# Patient Record
Sex: Female | Born: 1970 | Race: Black or African American | Hispanic: No | Marital: Married | Smoking: Former smoker
Health system: Southern US, Community
[De-identification: ages and names within clinical notes are randomized; demographics above are authoritative.]

## PROBLEM LIST (undated history)

## (undated) DIAGNOSIS — G4730 Sleep apnea, unspecified: Secondary | ICD-10-CM

## (undated) DIAGNOSIS — I10 Essential (primary) hypertension: Secondary | ICD-10-CM

## (undated) DIAGNOSIS — E785 Hyperlipidemia, unspecified: Secondary | ICD-10-CM

## (undated) DIAGNOSIS — N938 Other specified abnormal uterine and vaginal bleeding: Secondary | ICD-10-CM

## (undated) DIAGNOSIS — IMO0002 Reserved for concepts with insufficient information to code with codable children: Secondary | ICD-10-CM

## (undated) HISTORY — DX: Essential (primary) hypertension: I10

## (undated) HISTORY — DX: Reserved for concepts with insufficient information to code with codable children: IMO0002

## (undated) HISTORY — PX: KNEE SURGERY: SHX244

## (undated) HISTORY — DX: Hyperlipidemia, unspecified: E78.5

## (undated) HISTORY — DX: Sleep apnea, unspecified: G47.30

## (undated) HISTORY — DX: Other specified abnormal uterine and vaginal bleeding: N93.8

## (undated) HISTORY — PX: TUBAL LIGATION: SHX77

---

## 2000-10-22 ENCOUNTER — Emergency Department (HOSPITAL_COMMUNITY): Admission: EM | Admit: 2000-10-22 | Discharge: 2000-10-22 | Payer: Self-pay | Admitting: Internal Medicine

## 2000-12-29 ENCOUNTER — Other Ambulatory Visit: Admission: RE | Admit: 2000-12-29 | Discharge: 2000-12-29 | Payer: Self-pay | Admitting: *Deleted

## 2001-01-13 ENCOUNTER — Encounter: Payer: Self-pay | Admitting: *Deleted

## 2001-01-13 ENCOUNTER — Ambulatory Visit (HOSPITAL_COMMUNITY): Admission: RE | Admit: 2001-01-13 | Discharge: 2001-01-13 | Payer: Self-pay | Admitting: *Deleted

## 2001-03-15 ENCOUNTER — Encounter: Payer: Self-pay | Admitting: *Deleted

## 2001-03-15 ENCOUNTER — Ambulatory Visit (HOSPITAL_COMMUNITY): Admission: RE | Admit: 2001-03-15 | Discharge: 2001-03-15 | Payer: Self-pay | Admitting: *Deleted

## 2001-05-09 ENCOUNTER — Ambulatory Visit (HOSPITAL_COMMUNITY): Admission: AD | Admit: 2001-05-09 | Discharge: 2001-05-09 | Payer: Self-pay | Admitting: *Deleted

## 2001-05-12 ENCOUNTER — Ambulatory Visit (HOSPITAL_COMMUNITY): Admission: RE | Admit: 2001-05-12 | Discharge: 2001-05-12 | Payer: Self-pay | Admitting: *Deleted

## 2001-05-16 ENCOUNTER — Ambulatory Visit (HOSPITAL_COMMUNITY): Admission: RE | Admit: 2001-05-16 | Discharge: 2001-05-16 | Payer: Self-pay | Admitting: *Deleted

## 2001-05-20 ENCOUNTER — Ambulatory Visit (HOSPITAL_COMMUNITY): Admission: RE | Admit: 2001-05-20 | Discharge: 2001-05-20 | Payer: Self-pay

## 2001-05-23 ENCOUNTER — Ambulatory Visit (HOSPITAL_COMMUNITY): Admission: RE | Admit: 2001-05-23 | Discharge: 2001-05-23 | Payer: Self-pay | Admitting: *Deleted

## 2001-05-26 ENCOUNTER — Ambulatory Visit (HOSPITAL_COMMUNITY): Admission: RE | Admit: 2001-05-26 | Discharge: 2001-05-26 | Payer: Self-pay | Admitting: *Deleted

## 2001-05-29 ENCOUNTER — Inpatient Hospital Stay (HOSPITAL_COMMUNITY): Admission: RE | Admit: 2001-05-29 | Discharge: 2001-06-02 | Payer: Self-pay | Admitting: *Deleted

## 2002-04-01 ENCOUNTER — Emergency Department (HOSPITAL_COMMUNITY): Admission: EM | Admit: 2002-04-01 | Discharge: 2002-04-02 | Payer: Self-pay | Admitting: Internal Medicine

## 2003-11-14 ENCOUNTER — Other Ambulatory Visit: Admission: RE | Admit: 2003-11-14 | Discharge: 2003-11-14 | Payer: Self-pay

## 2004-07-01 ENCOUNTER — Other Ambulatory Visit: Admission: RE | Admit: 2004-07-01 | Discharge: 2004-07-01 | Payer: Self-pay

## 2005-06-04 ENCOUNTER — Emergency Department (HOSPITAL_COMMUNITY): Admission: EM | Admit: 2005-06-04 | Discharge: 2005-06-04 | Payer: Self-pay | Admitting: Emergency Medicine

## 2005-07-02 ENCOUNTER — Ambulatory Visit (HOSPITAL_COMMUNITY): Admission: RE | Admit: 2005-07-02 | Discharge: 2005-07-02 | Payer: Self-pay | Admitting: Orthopaedic Surgery

## 2005-07-02 ENCOUNTER — Encounter (INDEPENDENT_AMBULATORY_CARE_PROVIDER_SITE_OTHER): Payer: Self-pay | Admitting: Orthopaedic Surgery

## 2005-07-24 ENCOUNTER — Ambulatory Visit (HOSPITAL_COMMUNITY): Admission: RE | Admit: 2005-07-24 | Discharge: 2005-07-24 | Payer: Self-pay | Admitting: Family Medicine

## 2006-04-28 ENCOUNTER — Emergency Department (HOSPITAL_COMMUNITY): Admission: EM | Admit: 2006-04-28 | Discharge: 2006-04-28 | Payer: Self-pay | Admitting: Emergency Medicine

## 2006-04-28 ENCOUNTER — Encounter (INDEPENDENT_AMBULATORY_CARE_PROVIDER_SITE_OTHER): Payer: Self-pay | Admitting: Family Medicine

## 2006-05-24 ENCOUNTER — Encounter: Payer: Self-pay | Admitting: Family Medicine

## 2006-05-24 ENCOUNTER — Ambulatory Visit: Payer: Self-pay | Admitting: Family Medicine

## 2006-05-24 ENCOUNTER — Encounter (INDEPENDENT_AMBULATORY_CARE_PROVIDER_SITE_OTHER): Payer: Self-pay | Admitting: Family Medicine

## 2006-05-24 DIAGNOSIS — Z9889 Other specified postprocedural states: Secondary | ICD-10-CM | POA: Insufficient documentation

## 2006-05-24 DIAGNOSIS — E669 Obesity, unspecified: Secondary | ICD-10-CM | POA: Insufficient documentation

## 2006-05-24 DIAGNOSIS — F17200 Nicotine dependence, unspecified, uncomplicated: Secondary | ICD-10-CM

## 2006-05-24 DIAGNOSIS — M1990 Unspecified osteoarthritis, unspecified site: Secondary | ICD-10-CM | POA: Insufficient documentation

## 2006-05-24 DIAGNOSIS — Z6841 Body Mass Index (BMI) 40.0 and over, adult: Secondary | ICD-10-CM | POA: Insufficient documentation

## 2006-05-24 DIAGNOSIS — E119 Type 2 diabetes mellitus without complications: Secondary | ICD-10-CM | POA: Insufficient documentation

## 2006-05-24 DIAGNOSIS — I10 Essential (primary) hypertension: Secondary | ICD-10-CM

## 2006-05-24 LAB — CONVERTED CEMR LAB
Hgb A1c MFr Bld: 10.9 %
RBC count: 4.78 10*6/uL

## 2006-06-01 ENCOUNTER — Encounter (INDEPENDENT_AMBULATORY_CARE_PROVIDER_SITE_OTHER): Payer: Self-pay | Admitting: Family Medicine

## 2006-06-02 LAB — CONVERTED CEMR LAB
CO2: 23 meq/L (ref 19–32)
Calcium: 9.2 mg/dL (ref 8.4–10.5)
Chloride: 104 meq/L (ref 96–112)
Cholesterol: 199 mg/dL (ref 0–200)
Creatinine, Ser: 0.62 mg/dL (ref 0.40–1.20)
Glucose, Bld: 126 mg/dL — ABNORMAL HIGH (ref 70–99)
Total Bilirubin: 0.4 mg/dL (ref 0.3–1.2)
Total CHOL/HDL Ratio: 4.1
Total Protein: 7.1 g/dL (ref 6.0–8.3)
Triglycerides: 146 mg/dL (ref <150)
VLDL: 29 mg/dL (ref 0–40)

## 2006-06-04 ENCOUNTER — Telehealth (INDEPENDENT_AMBULATORY_CARE_PROVIDER_SITE_OTHER): Payer: Self-pay | Admitting: Family Medicine

## 2006-06-04 ENCOUNTER — Encounter: Payer: Self-pay | Admitting: Family Medicine

## 2006-06-07 ENCOUNTER — Ambulatory Visit: Payer: Self-pay | Admitting: Family Medicine

## 2006-06-07 DIAGNOSIS — E785 Hyperlipidemia, unspecified: Secondary | ICD-10-CM | POA: Insufficient documentation

## 2006-06-07 DIAGNOSIS — E8881 Metabolic syndrome: Secondary | ICD-10-CM

## 2006-06-07 DIAGNOSIS — R945 Abnormal results of liver function studies: Secondary | ICD-10-CM | POA: Insufficient documentation

## 2006-06-07 LAB — CONVERTED CEMR LAB
HDL goal, serum: 40 mg/dL
LDL Goal: 100 mg/dL

## 2006-06-09 ENCOUNTER — Encounter (INDEPENDENT_AMBULATORY_CARE_PROVIDER_SITE_OTHER): Payer: Self-pay | Admitting: Family Medicine

## 2006-06-09 ENCOUNTER — Ambulatory Visit (HOSPITAL_COMMUNITY): Admission: RE | Admit: 2006-06-09 | Discharge: 2006-06-09 | Payer: Self-pay | Admitting: Family Medicine

## 2006-06-11 ENCOUNTER — Encounter (INDEPENDENT_AMBULATORY_CARE_PROVIDER_SITE_OTHER): Payer: Self-pay | Admitting: Family Medicine

## 2006-06-13 LAB — CONVERTED CEMR LAB
HCV Ab: NEGATIVE
Hepatitis B Surface Ag: NEGATIVE

## 2006-06-16 LAB — CONVERTED CEMR LAB
Creatinine Clearance: 212 mL/min — ABNORMAL HIGH (ref 75–115)
Protein, Ur: 315 mg/24hr — ABNORMAL HIGH (ref 50–100)

## 2006-07-15 ENCOUNTER — Ambulatory Visit: Payer: Self-pay | Admitting: Family Medicine

## 2006-07-15 DIAGNOSIS — G4733 Obstructive sleep apnea (adult) (pediatric): Secondary | ICD-10-CM | POA: Insufficient documentation

## 2006-07-15 DIAGNOSIS — G4730 Sleep apnea, unspecified: Secondary | ICD-10-CM

## 2006-07-19 LAB — CONVERTED CEMR LAB
ALT: 68 units/L — ABNORMAL HIGH (ref 0–35)
AST: 34 units/L (ref 0–37)
Albumin: 3.6 g/dL (ref 3.5–5.2)
Alkaline Phosphatase: 94 units/L (ref 39–117)
BUN: 8 mg/dL (ref 6–23)
Calcium: 8.9 mg/dL (ref 8.4–10.5)
Chloride: 109 meq/L (ref 96–112)
Potassium: 4.2 meq/L (ref 3.5–5.3)
Sodium: 138 meq/L (ref 135–145)
Total Protein: 7.3 g/dL (ref 6.0–8.3)

## 2006-07-20 ENCOUNTER — Telehealth (INDEPENDENT_AMBULATORY_CARE_PROVIDER_SITE_OTHER): Payer: Self-pay | Admitting: *Deleted

## 2006-07-30 ENCOUNTER — Encounter (INDEPENDENT_AMBULATORY_CARE_PROVIDER_SITE_OTHER): Payer: Self-pay | Admitting: Family Medicine

## 2006-08-26 ENCOUNTER — Ambulatory Visit: Payer: Self-pay | Admitting: Family Medicine

## 2006-09-10 ENCOUNTER — Telehealth (INDEPENDENT_AMBULATORY_CARE_PROVIDER_SITE_OTHER): Payer: Self-pay | Admitting: Family Medicine

## 2006-10-22 ENCOUNTER — Ambulatory Visit: Payer: Self-pay | Admitting: Family Medicine

## 2006-10-25 ENCOUNTER — Encounter (INDEPENDENT_AMBULATORY_CARE_PROVIDER_SITE_OTHER): Payer: Self-pay | Admitting: Family Medicine

## 2006-10-27 LAB — CONVERTED CEMR LAB: Gardnerella vaginalis: NEGATIVE

## 2006-11-02 ENCOUNTER — Telehealth (INDEPENDENT_AMBULATORY_CARE_PROVIDER_SITE_OTHER): Payer: Self-pay | Admitting: *Deleted

## 2006-11-05 ENCOUNTER — Telehealth (INDEPENDENT_AMBULATORY_CARE_PROVIDER_SITE_OTHER): Payer: Self-pay | Admitting: Family Medicine

## 2007-03-07 IMAGING — US US ABDOMEN COMPLETE
1 series · 14 of 25 positions shown · non-contrast
Comparison: none

HISTORY: Abnormal LFTs, proteinuria 

ULTRASOUND ABDOMEN COMPLETE:
TECHNIQUE: Complete abdominal ultrasound examination was performed including
evaluation of the liver, gallbladder, bile ducts, pancreas, kidneys, spleen,
IVC, and abdominal aorta.
Examination limited by patient size and poor sound transmission.

[Series 1: us abdomen complete · 0.45mm/px · 14 of 72 slices shown]
[im 1/72]
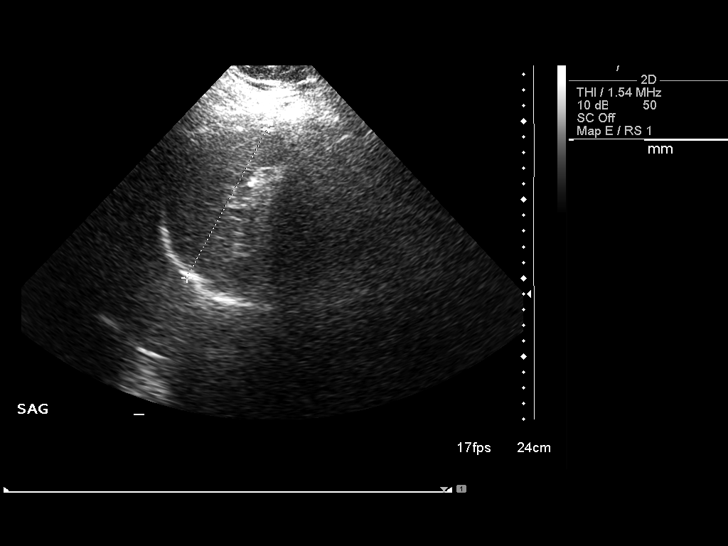
[im 6/72]
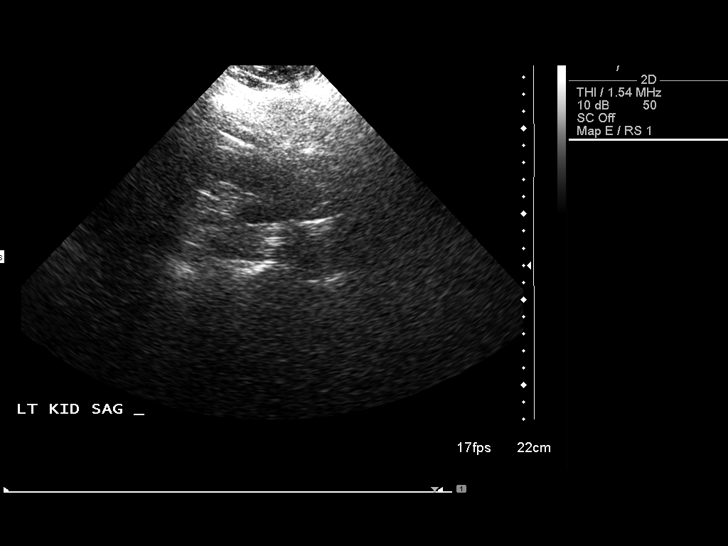
[im 12/72]
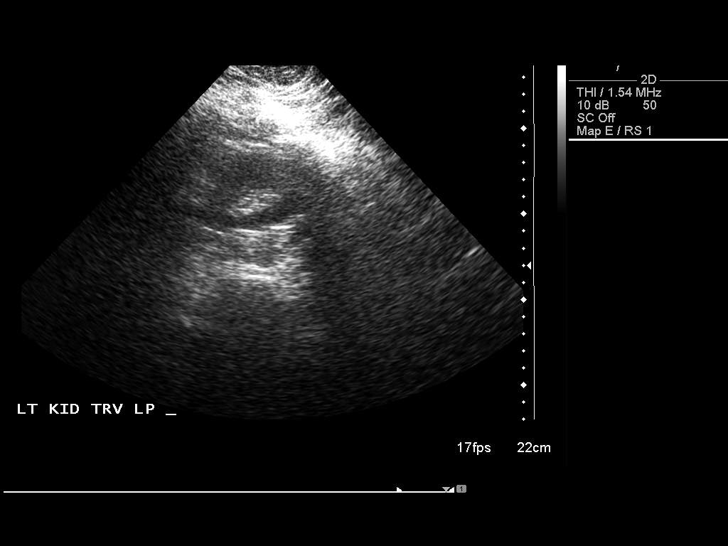
[im 18/72]
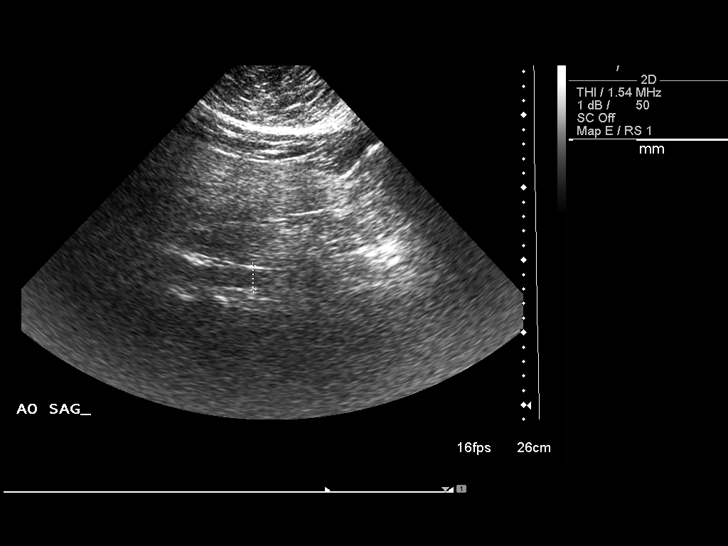
[im 24/72]
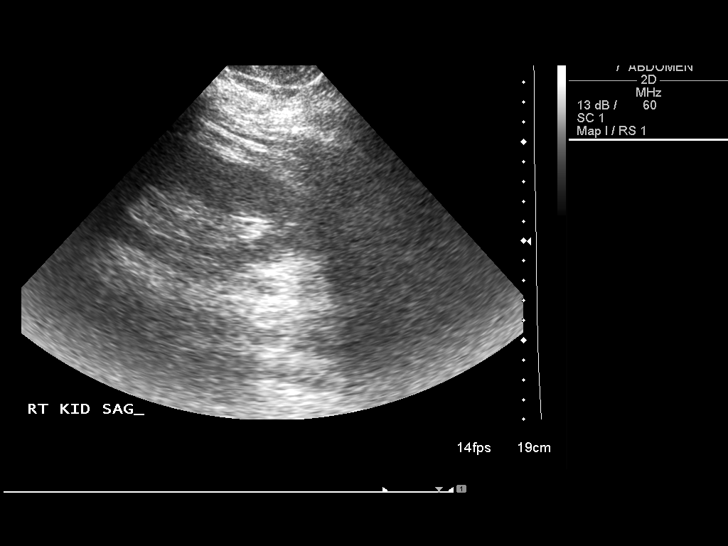
[im 27/72]
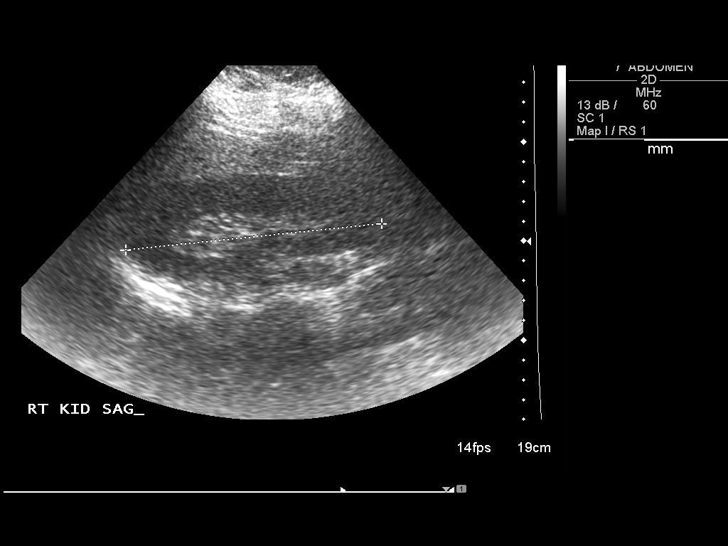
[im 33/72]
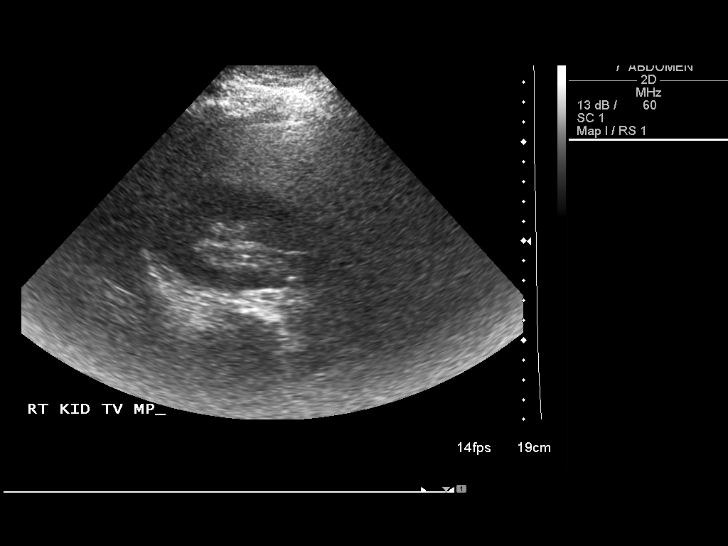
[im 39/72]
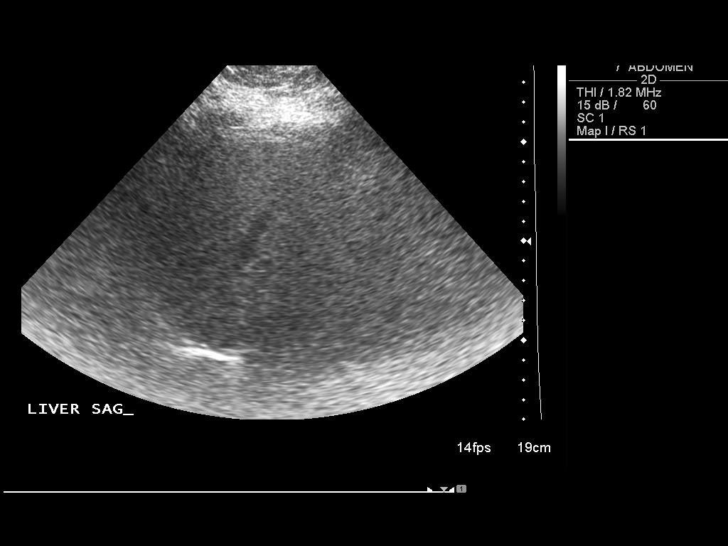
[im 45/72]
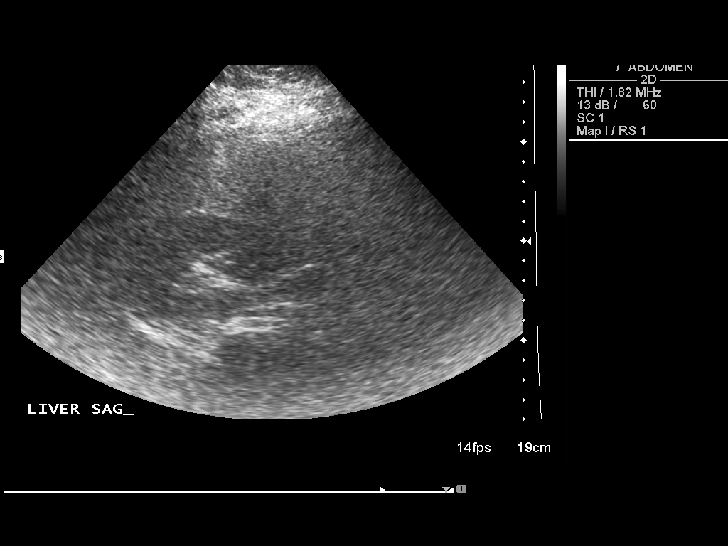
[im 48/72]
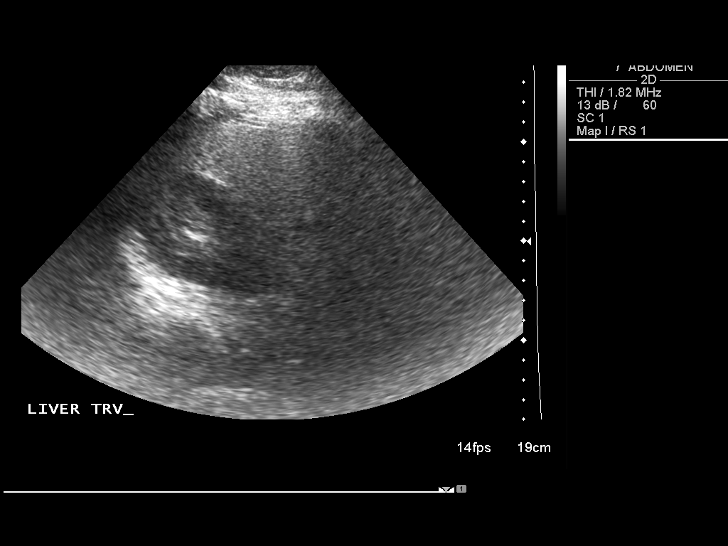
[im 54/72]
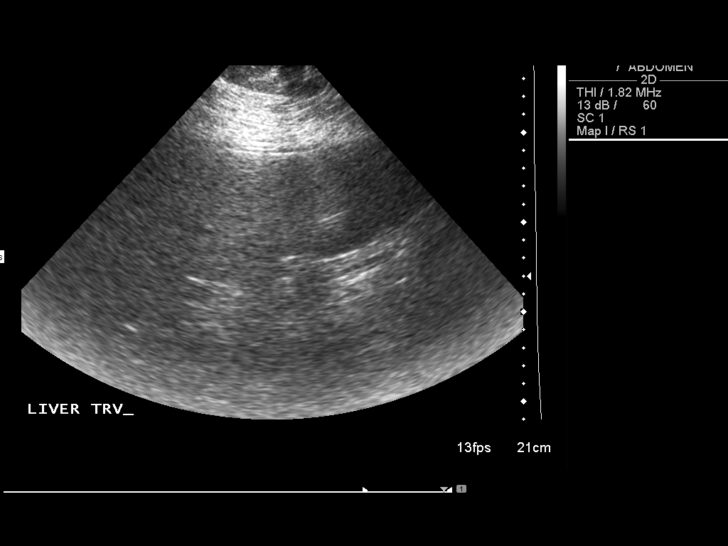
[im 60/72]
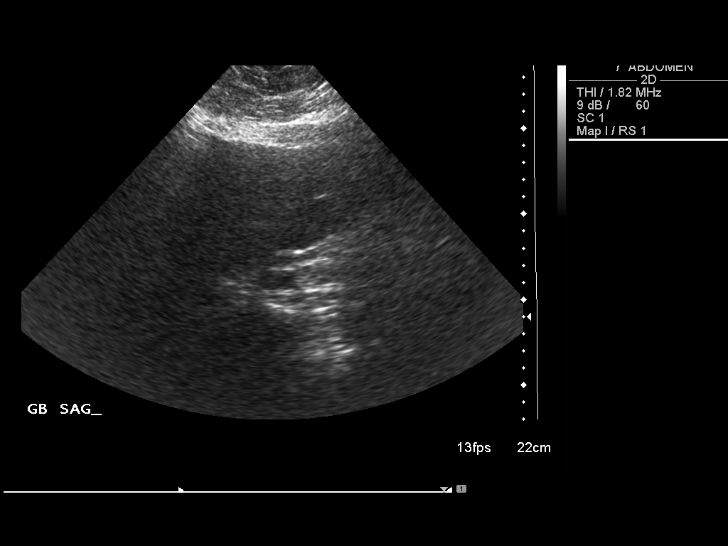
[im 66/72]
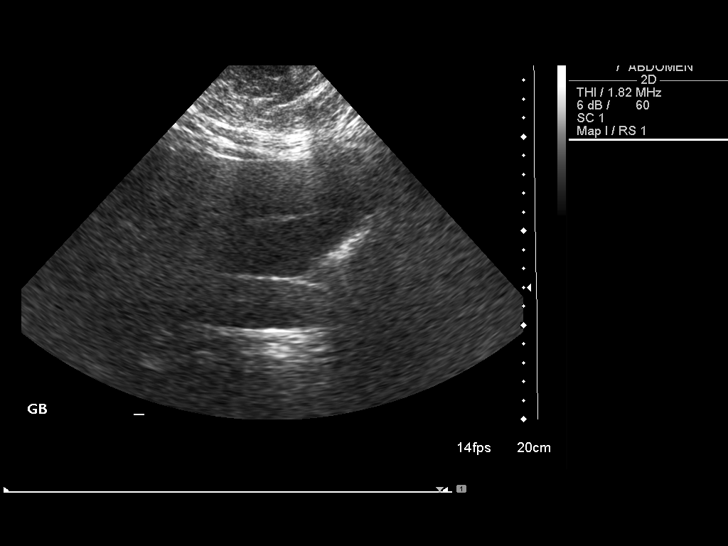
[im 72/72]
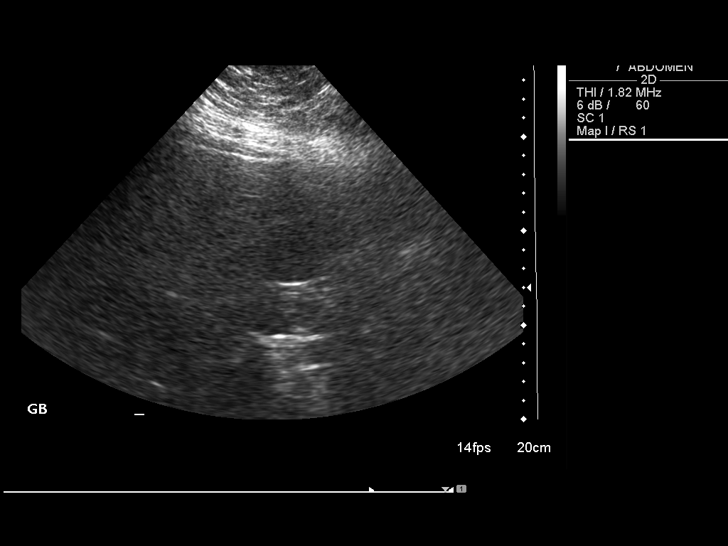

[14 of 25 positions shown; findings below may reference images not displayed]

Gallbladder grossly normal without stones or wall thickening.
Common bile duct normal caliber, 4 mm diameter.
Echogenic liver, likely fatty infiltration, though this can be seen with
cirrhosis and certain infiltrative disorders.
Suboptimal assessment of intrahepatic detail due to poor sound
through-transmission and patient size.
Pancreas and spleen normal appearance, spleen 10.9 cm length.
Kidneys normal appearance, right 13.0 cm  cm length and left 12.4 cm.
Aorta and IVC normal.
No ascites.
IMPRESSION: Probable fatty infiltration of liver.
Suboptimal hepatic assessment.
Remainder of exam unremarkable.

## 2008-02-03 ENCOUNTER — Emergency Department (HOSPITAL_COMMUNITY): Admission: EM | Admit: 2008-02-03 | Discharge: 2008-02-03 | Payer: Self-pay | Admitting: Emergency Medicine

## 2008-02-19 ENCOUNTER — Emergency Department (HOSPITAL_COMMUNITY): Admission: EM | Admit: 2008-02-19 | Discharge: 2008-02-19 | Payer: Self-pay | Admitting: Emergency Medicine

## 2008-09-20 ENCOUNTER — Other Ambulatory Visit: Admission: RE | Admit: 2008-09-20 | Discharge: 2008-09-20 | Payer: Self-pay | Admitting: Obstetrics and Gynecology

## 2008-12-24 ENCOUNTER — Ambulatory Visit (HOSPITAL_COMMUNITY): Admission: RE | Admit: 2008-12-24 | Discharge: 2008-12-24 | Payer: Self-pay | Admitting: Obstetrics and Gynecology

## 2009-01-14 ENCOUNTER — Ambulatory Visit (HOSPITAL_COMMUNITY): Admission: RE | Admit: 2009-01-14 | Discharge: 2009-01-14 | Payer: Self-pay | Admitting: Obstetrics and Gynecology

## 2009-02-06 ENCOUNTER — Ambulatory Visit (HOSPITAL_COMMUNITY): Admission: RE | Admit: 2009-02-06 | Discharge: 2009-02-06 | Payer: Self-pay | Admitting: Obstetrics and Gynecology

## 2009-02-11 ENCOUNTER — Ambulatory Visit (HOSPITAL_COMMUNITY): Admission: RE | Admit: 2009-02-11 | Discharge: 2009-02-11 | Payer: Self-pay | Admitting: Obstetrics and Gynecology

## 2009-03-11 ENCOUNTER — Ambulatory Visit (HOSPITAL_COMMUNITY): Admission: RE | Admit: 2009-03-11 | Discharge: 2009-03-11 | Payer: Self-pay | Admitting: Obstetrics and Gynecology

## 2009-04-12 ENCOUNTER — Ambulatory Visit (HOSPITAL_COMMUNITY): Admission: RE | Admit: 2009-04-12 | Discharge: 2009-04-12 | Payer: Self-pay | Admitting: Obstetrics and Gynecology

## 2009-04-18 ENCOUNTER — Ambulatory Visit (HOSPITAL_COMMUNITY): Admission: RE | Admit: 2009-04-18 | Discharge: 2009-04-18 | Payer: Self-pay | Admitting: Obstetrics and Gynecology

## 2009-04-23 ENCOUNTER — Ambulatory Visit (HOSPITAL_COMMUNITY): Admission: RE | Admit: 2009-04-23 | Discharge: 2009-04-23 | Payer: Self-pay | Admitting: Obstetrics and Gynecology

## 2009-04-27 DIAGNOSIS — R87619 Unspecified abnormal cytological findings in specimens from cervix uteri: Secondary | ICD-10-CM

## 2009-04-27 DIAGNOSIS — IMO0002 Reserved for concepts with insufficient information to code with codable children: Secondary | ICD-10-CM

## 2009-04-27 HISTORY — DX: Reserved for concepts with insufficient information to code with codable children: IMO0002

## 2009-04-27 HISTORY — DX: Unspecified abnormal cytological findings in specimens from cervix uteri: R87.619

## 2009-04-29 ENCOUNTER — Ambulatory Visit (HOSPITAL_COMMUNITY): Admission: RE | Admit: 2009-04-29 | Discharge: 2009-04-29 | Payer: Self-pay | Admitting: Obstetrics and Gynecology

## 2009-05-03 ENCOUNTER — Ambulatory Visit (HOSPITAL_COMMUNITY): Admission: RE | Admit: 2009-05-03 | Discharge: 2009-05-03 | Payer: Self-pay | Admitting: Obstetrics and Gynecology

## 2009-05-06 ENCOUNTER — Ambulatory Visit (HOSPITAL_COMMUNITY): Admission: RE | Admit: 2009-05-06 | Discharge: 2009-05-06 | Payer: Self-pay | Admitting: Obstetrics and Gynecology

## 2009-05-10 ENCOUNTER — Ambulatory Visit (HOSPITAL_COMMUNITY): Admission: RE | Admit: 2009-05-10 | Discharge: 2009-05-10 | Payer: Self-pay | Admitting: Obstetrics and Gynecology

## 2009-05-13 ENCOUNTER — Ambulatory Visit: Payer: Self-pay | Admitting: Obstetrics & Gynecology

## 2009-05-14 ENCOUNTER — Inpatient Hospital Stay (HOSPITAL_COMMUNITY): Admission: AD | Admit: 2009-05-14 | Discharge: 2009-05-14 | Payer: Self-pay | Admitting: Obstetrics and Gynecology

## 2009-05-17 ENCOUNTER — Ambulatory Visit (HOSPITAL_COMMUNITY): Admission: RE | Admit: 2009-05-17 | Discharge: 2009-05-17 | Payer: Self-pay | Admitting: Obstetrics and Gynecology

## 2009-05-20 ENCOUNTER — Ambulatory Visit (HOSPITAL_COMMUNITY): Admission: RE | Admit: 2009-05-20 | Discharge: 2009-05-20 | Payer: Self-pay | Admitting: Obstetrics and Gynecology

## 2009-05-23 ENCOUNTER — Ambulatory Visit (HOSPITAL_COMMUNITY): Admission: RE | Admit: 2009-05-23 | Discharge: 2009-05-23 | Payer: Self-pay | Admitting: Obstetrics and Gynecology

## 2009-05-27 ENCOUNTER — Encounter (INDEPENDENT_AMBULATORY_CARE_PROVIDER_SITE_OTHER): Payer: Self-pay | Admitting: Obstetrics and Gynecology

## 2009-05-27 ENCOUNTER — Inpatient Hospital Stay (HOSPITAL_COMMUNITY): Admission: RE | Admit: 2009-05-27 | Discharge: 2009-05-30 | Payer: Self-pay | Admitting: Obstetrics and Gynecology

## 2009-09-10 ENCOUNTER — Other Ambulatory Visit: Admission: RE | Admit: 2009-09-10 | Discharge: 2009-09-10 | Payer: Self-pay | Admitting: Obstetrics and Gynecology

## 2010-01-14 IMAGING — US US FETAL BPP W/O NONSTRESS
1 series · 16 of 16 positions shown · non-contrast
Comparison: none

OBSTETRICAL ULTRASOUND:
 This ultrasound was performed in The [HOSPITAL], and the [facility] report will be stored to [REDACTED] PACS.  This report is also available in [HOSPITAL]?s accessANYware.

[Series 1: us fetal bpp w/o nonstress · 16 acquisitions, 16 frames shown]
[im 1/16]
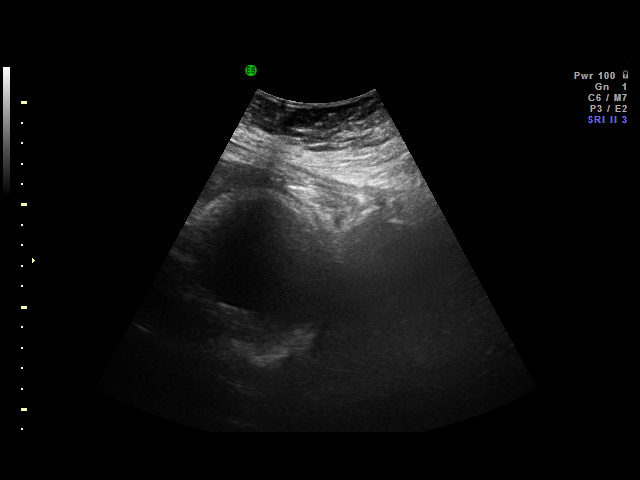
[im 2/16]
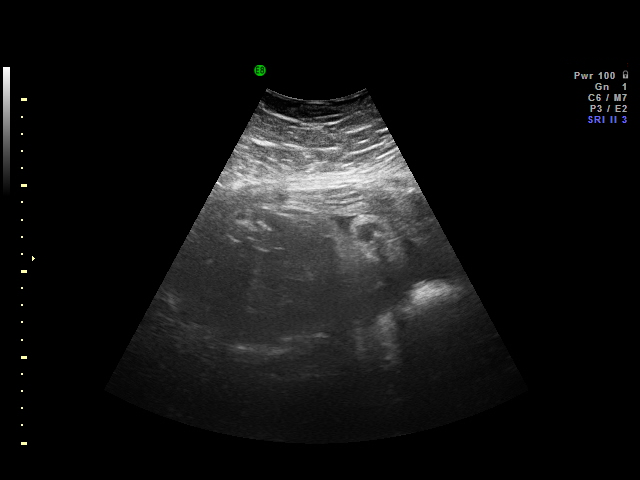
[im 3/16]
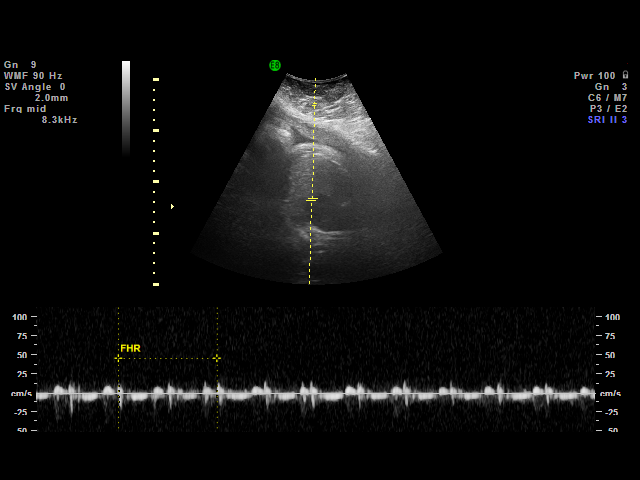
[im 4/16]
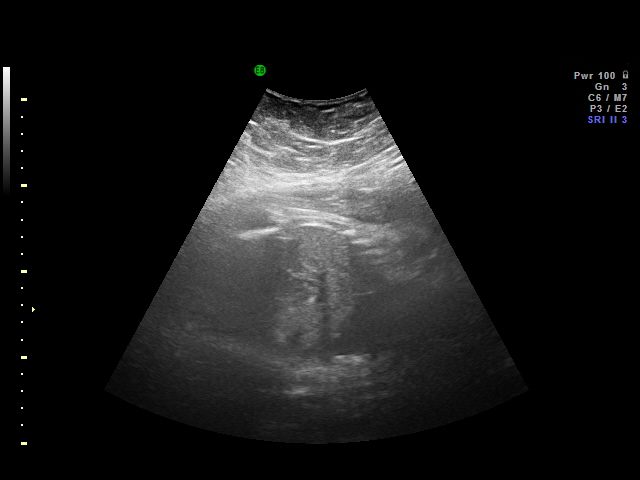
[im 5/16]
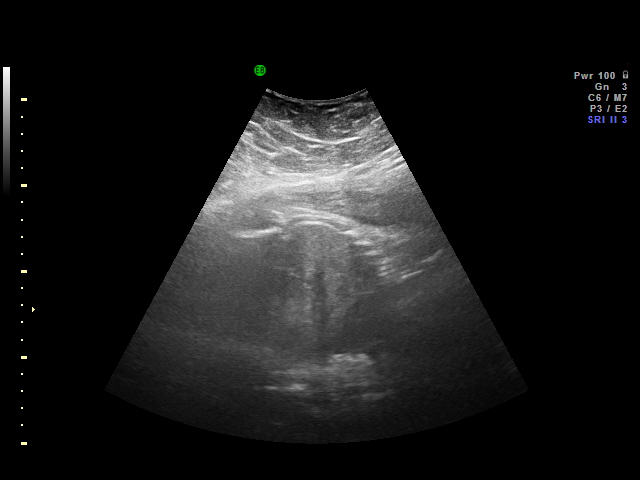
[im 6/16]
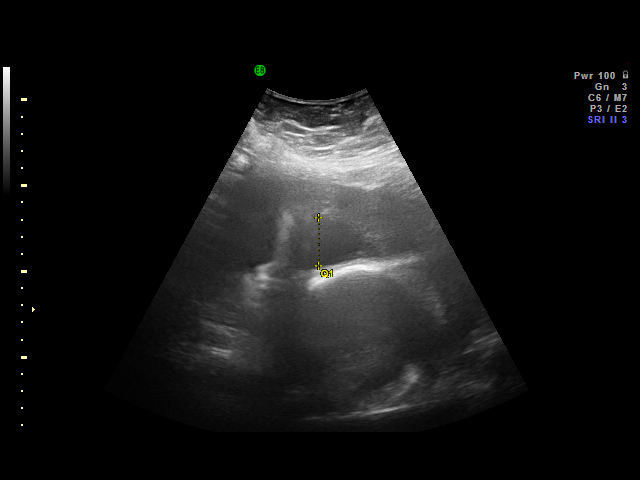
[im 7/16]
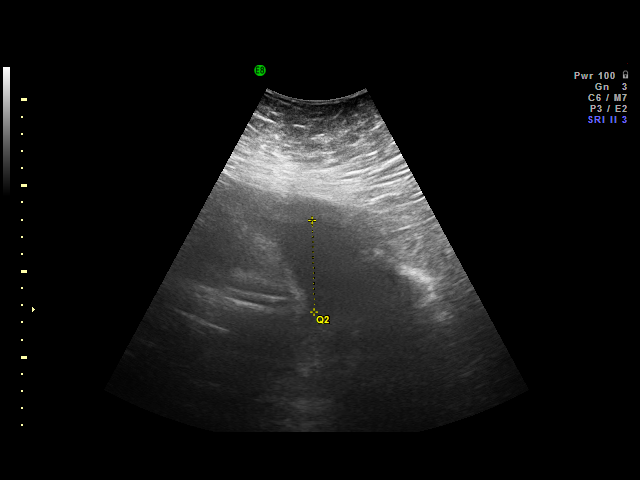
[im 8/16]
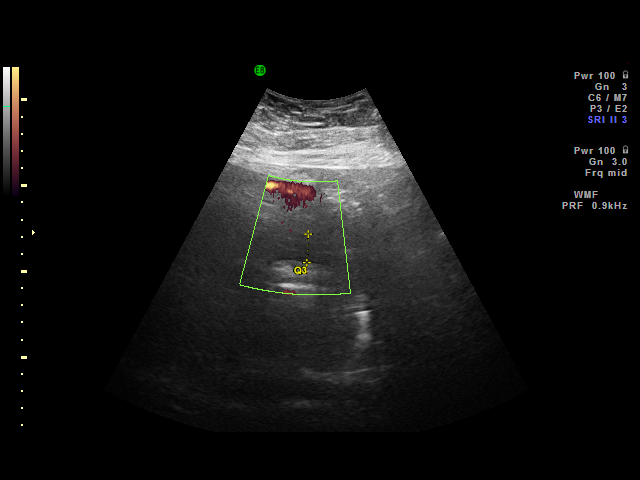
[im 9/16]
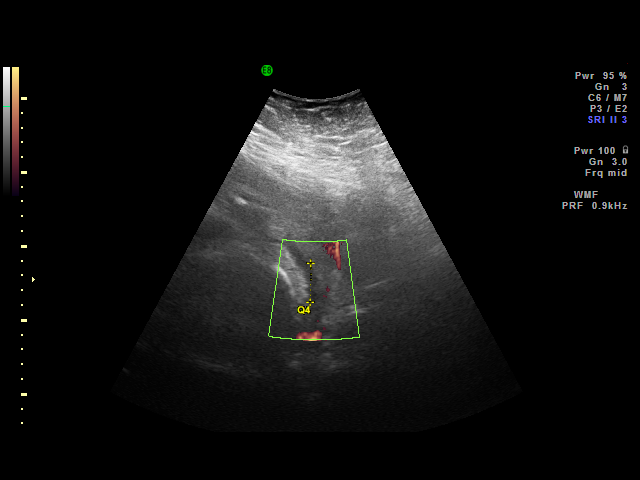
[im 10/16]
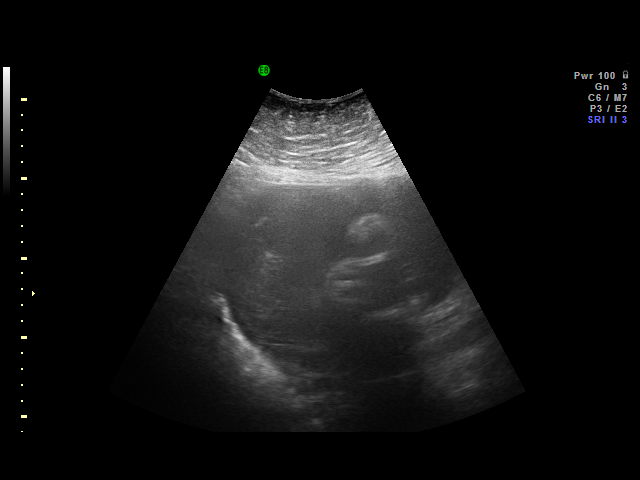
[im 11/16]
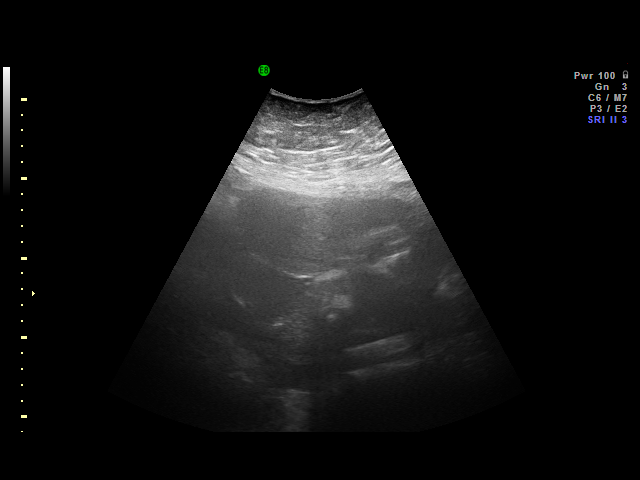
[im 12/16]
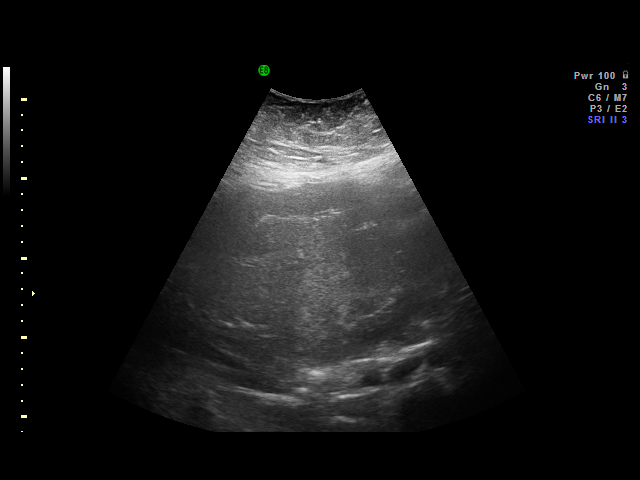
[im 13/16]
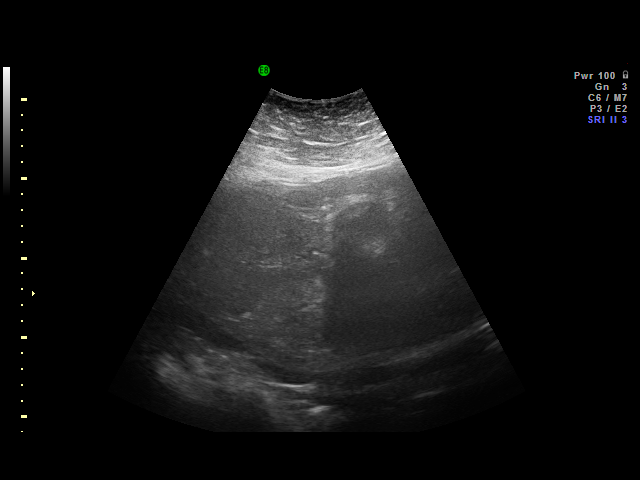
[im 14/16]
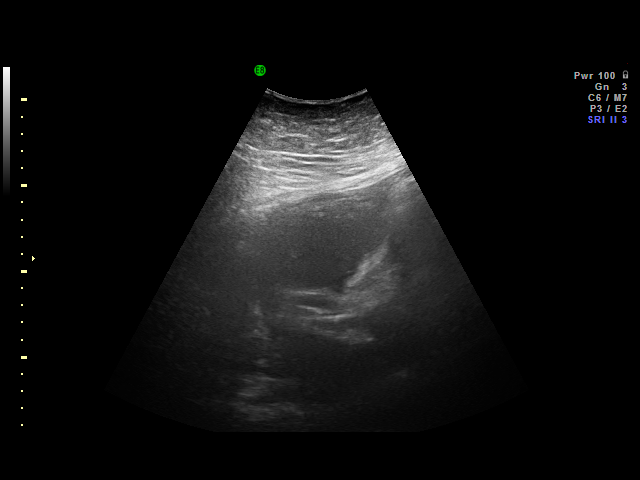
[im 15/16]
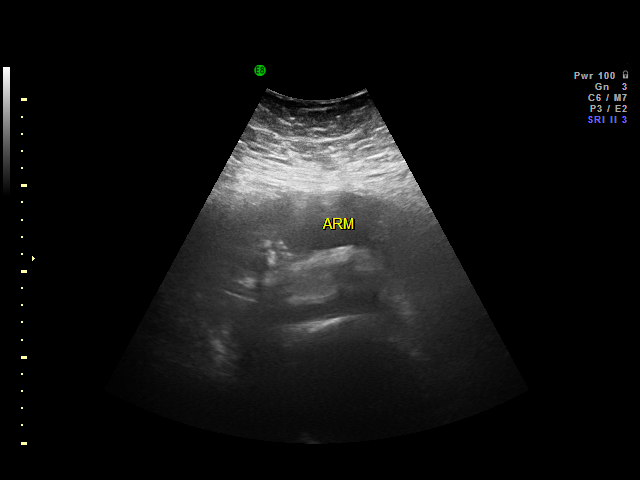
[im 16/16]
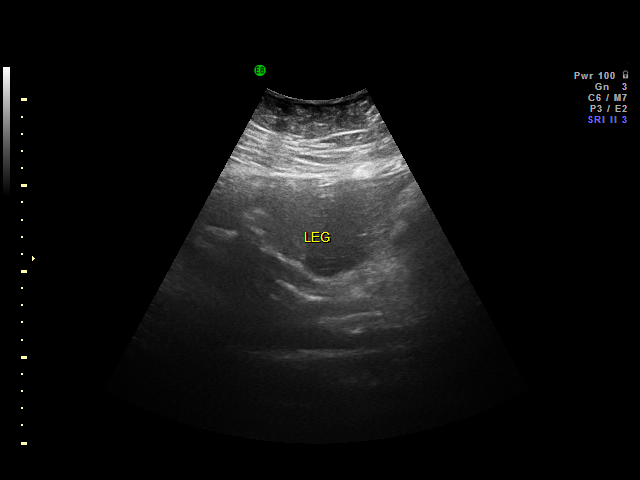

[16 of 16 positions shown; findings below may reference images not displayed]

IMPRESSION: [facility] has also been faxed to the ordering physician.

## 2010-01-19 IMAGING — US US FETAL BPP W/O NONSTRESS
1 series · 7 of 7 positions shown · non-contrast
Comparison: none

OBSTETRICAL ULTRASOUND:
 This ultrasound was performed in The [HOSPITAL], and the [facility] report will be stored to [REDACTED] PACS.  This report is also available in [HOSPITAL]?s accessANYware.

[Series 1: us fetal bpp w/o nonstress · 7 acquisitions, 7 frames shown]
[im 1/7]
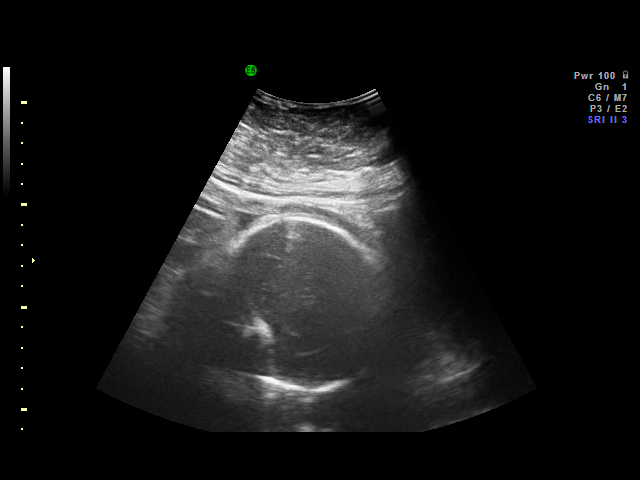
[im 2/7]
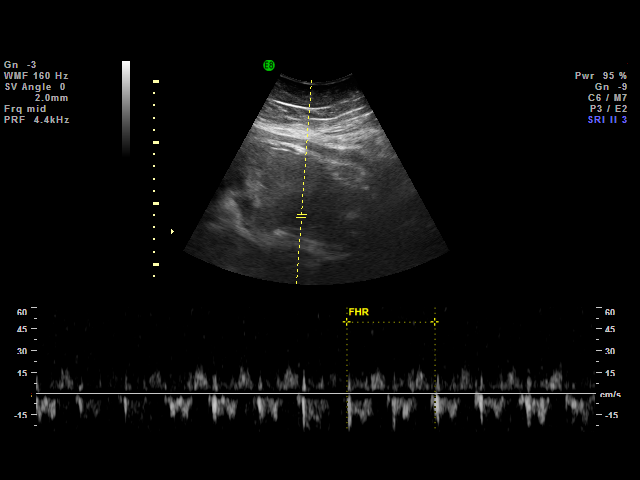
[im 3/7]
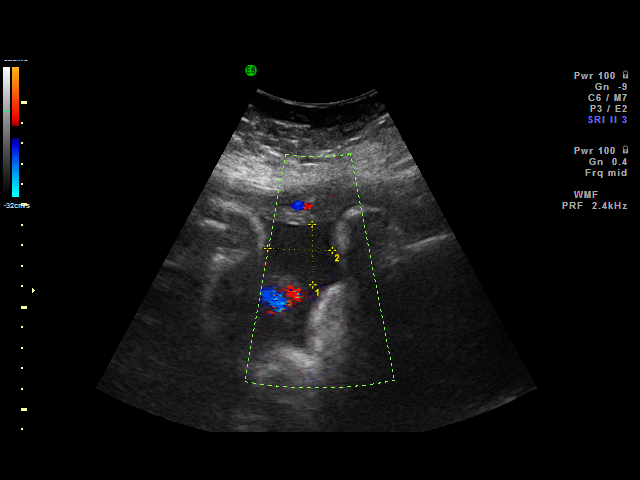
[im 4/7]
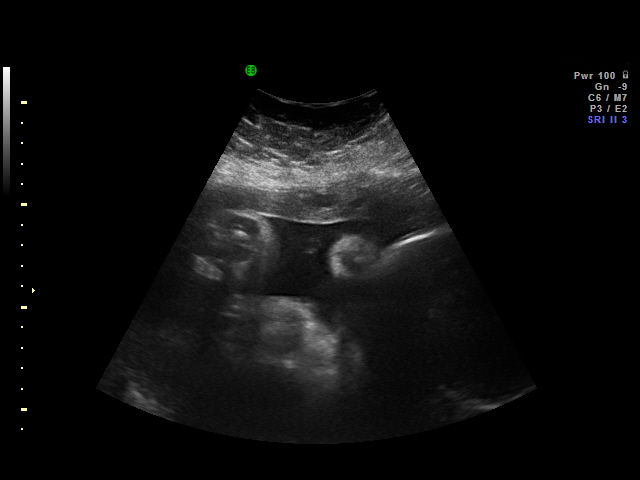
[im 5/7]
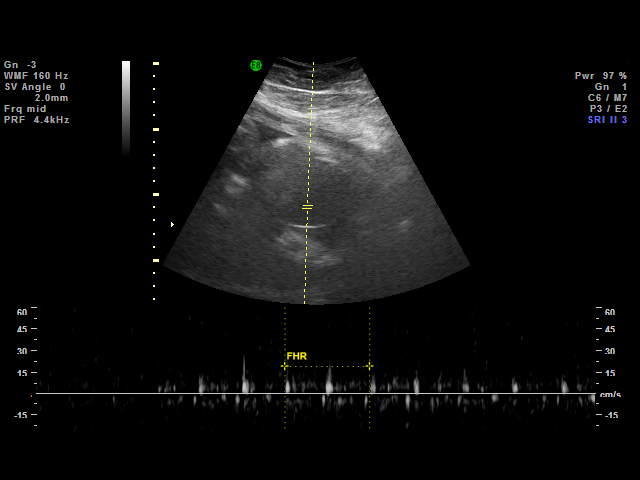
[im 6/7]
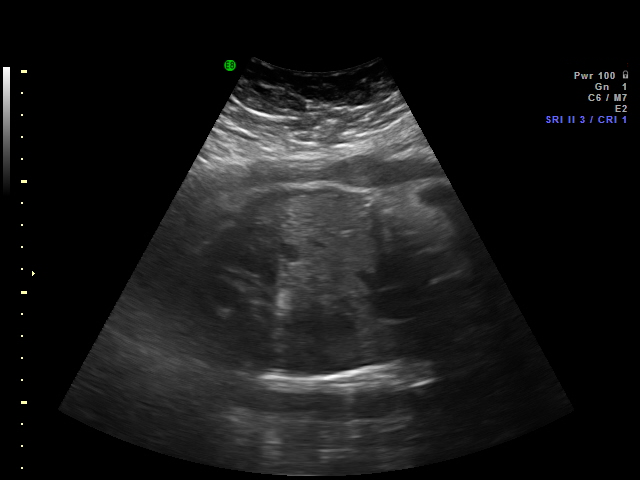
[im 7/7]
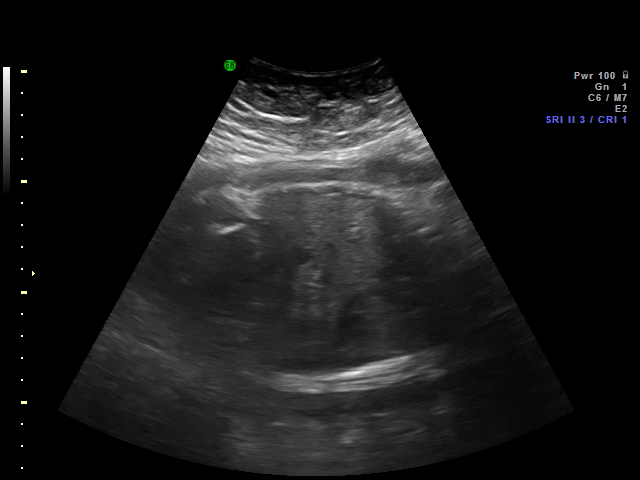

[7 of 7 positions shown; findings below may reference images not displayed]

IMPRESSION: [facility] has also been faxed to the ordering physician.

## 2010-05-18 ENCOUNTER — Encounter: Payer: Self-pay | Admitting: Obstetrics and Gynecology

## 2010-05-19 ENCOUNTER — Encounter: Payer: Self-pay | Admitting: Obstetrics and Gynecology

## 2010-07-13 LAB — CBC
HCT: 35.5 % — ABNORMAL LOW (ref 36.0–46.0)
HCT: 36.1 % (ref 36.0–46.0)
Hemoglobin: 11.8 g/dL — ABNORMAL LOW (ref 12.0–15.0)
Hemoglobin: 11.9 g/dL — ABNORMAL LOW (ref 12.0–15.0)
MCV: 91.3 fL (ref 78.0–100.0)
Platelets: 332 10*3/uL (ref 150–400)
RBC: 3.95 MIL/uL (ref 3.87–5.11)
RDW: 15 % (ref 11.5–15.5)
WBC: 10.7 10*3/uL — ABNORMAL HIGH (ref 4.0–10.5)
WBC: 9.8 10*3/uL (ref 4.0–10.5)

## 2010-07-13 LAB — URINALYSIS, ROUTINE W REFLEX MICROSCOPIC
Glucose, UA: NEGATIVE mg/dL
Ketones, ur: NEGATIVE mg/dL
Leukocytes, UA: NEGATIVE
Nitrite: NEGATIVE
Protein, ur: 30 mg/dL — AB
pH: 5.5 (ref 5.0–8.0)

## 2010-07-13 LAB — GLUCOSE, CAPILLARY
Glucose-Capillary: 108 mg/dL — ABNORMAL HIGH (ref 70–99)
Glucose-Capillary: 86 mg/dL (ref 70–99)

## 2010-07-13 LAB — BASIC METABOLIC PANEL
Calcium: 9.3 mg/dL (ref 8.4–10.5)
GFR calc non Af Amer: >60 mL/min (ref >60)
Glucose, Bld: 105 mg/dL — ABNORMAL HIGH (ref 70–99)
Potassium: 3.8 mEq/L (ref 3.5–5.1)
Sodium: 134 mEq/L — ABNORMAL LOW (ref 135–145)

## 2010-07-13 LAB — ABO/RH: ABO/RH(D): AB POS

## 2010-07-13 LAB — TYPE AND SCREEN
ABO/RH(D): AB POS
Antibody Screen: NEGATIVE

## 2010-07-13 LAB — URINE MICROSCOPIC-ADD ON

## 2010-07-16 LAB — CBC
Platelets: 253 10*3/uL (ref 150–400)
RBC: 3.33 MIL/uL — ABNORMAL LOW (ref 3.87–5.11)
WBC: 8.4 10*3/uL (ref 4.0–10.5)

## 2010-07-16 LAB — GLUCOSE, CAPILLARY
Glucose-Capillary: 48 mg/dL — ABNORMAL LOW (ref 70–99)
Glucose-Capillary: 52 mg/dL — ABNORMAL LOW (ref 70–99)
Glucose-Capillary: 67 mg/dL — ABNORMAL LOW (ref 70–99)
Glucose-Capillary: 73 mg/dL (ref 70–99)
Glucose-Capillary: 73 mg/dL (ref 70–99)
Glucose-Capillary: 84 mg/dL (ref 70–99)
Glucose-Capillary: 87 mg/dL (ref 70–99)
Glucose-Capillary: 90 mg/dL (ref 70–99)

## 2010-07-16 LAB — CCBB MATERNAL DONOR DRAW

## 2010-09-12 NOTE — Discharge Summary (Signed)
[facility]  Patient:    Crystal Escobar, Crystal Escobar Visit Number: [id number] MRN: [id number]          Service Type: OBS Location: 4A A428 01 Attending Physician:  Jeri Cos. Dictated by:   Langley Gauss, M.D. Admit Date:  05/29/2001 Discharge Date: 06/02/2001                             Discharge Summary  DIAGNOSES: 1. A 38-week intrauterine pregnancy. 2. Chronic essential benign hypertension preceding the pregnancy. 3. Labor at [redacted] weeks gestation.  PROCEDURES PERFORMED: 1. Placement of Foley bulb for mechanical ripening the cervix. 2. Removal of Foley bulb followed by amniotomy on May 30, 2001. 3. Arrest of dilatation secondary to cephalopelvic disproportion. 4. Arrest of descent secondary to cephalopelvic disproportion.  PROCEDURE:  Primary low transverse cesarean section performed under spinal analgesic.  DISPOSITION AT TIME OF DISCHARGE:  The patient is given instructions to follow up in the office in 4 days time for staple removal from the midline abdominal incision.  DISCHARGE MEDICATIONS:  Lortab 10/500 for pain relief. In addition, the patient is given hydrochlorothiazide 50 mg p.o. q.d. for relief of fluid retention. The patient likewise will continue with the labetalol 100 mg p.o. b.i.d. which was used prenatally.  HOSPITAL COURSE:  See previous dictations.  Due to the unfavorable nature of the cervix, the patient was admitted on May 29, 2001 for placement of Foley bulb for mechanical ripening of the cervix. The Foley bulb was removed on May 30, 2001, at which time the cervix was noted to be about 3 cm dilated but the vertex was not engaged. When amniotomy was performed, clear amniotic fluid was noted. Fetal scalp electrode was placed. The patient thereafter did reach a functional labor pattern but did achieve arrest dilatation despite adequate labor contractions. Thus she was taken to the operating room where an  uncomplicated primary low transverse cesarean section was performed without complications. Due to the patients morbid obesity, this was performed through a midline abdominal incision. Postoperatively the patient was treated with IV magnesium sulfate x12 hours duration, at which time she had near normalization of her blood pressure. She did have a 1+ proteinuria; however, laboratory studies otherwise were within normal limits. Postoperatively the patient did well. She had excellent urine output postoperative day #1. The Foley catheter was removed on postoperative day #2. The JP catheter continued to drain very well. This was aspirated and removed on postoperative day #3, when only a clear serosanguineous type fluid was noted to be draining. The patient otherwise was able to ambulate. At time of discharge, the incision is well approximated, appears to be intact with minimal erythema, and minimal induration. Thus, the patient was discharged to home on postoperative day #3-1/2 with instructions to follow up in the office in 4 days time for staple removal.  The patient is given a copy of our standardized discharge instructions at time of discharge. Pertinently, the infant was noted to have some yellow jaundice following delivery. The patient had been made aware of during the prenatal course and had been discussed with both her and her husband the charge required for the elective circumcision to be performed. They had made no financial arrangements regarding this, thus circumcision is not performed during this hospital stay. Dictated by:   Langley Gauss, M.D. Attending Physician:  Jeri Cos. DD:  06/13/01 TD:  06/13/01 Job: [id number]

## 2010-09-12 NOTE — H&P (Signed)
[facility]  Patient:    Crystal Escobar, Crystal Escobar Visit Number: [id number] MRN: [id number]          Service Type: OBS Location: 4A A428 01 Attending Physician:  Jeri Cos. Dictated by:   Langley Gauss, M.D. Admit Date:  05/29/2001                           History and Physical  CHIEF COMPLAINT:  This is a 40 year old gravida 1 para 0, at [redacted] weeks gestation, admitted for induction of labor.  HISTORY OF PRESENT ILLNESS:  The patients prenatal course is complicated by impression of preexisting hypertension preceding the pregnancy.  The patient is noted on January 11, 2001 at [redacted] weeks gestation to have a blood pressure of 167/98.  At that point in time she gave no prior history of hypertension, nor had she ever previously been treated with any antihypertensive medications.  However, with that blood pressure of 167/98 at that time she was started on labetalol 100 mg p.o. b.i.d., which she has continued up to this point.  She has done well with this regimen, with blood pressures well controlled.  Most recently blood pressure 134/86.  She has had two times per week nonstress tests which have all been reactive.  Likewise, she has had serial ultrasounds with documented adequate fetal growth and normal amniotic fluid volume.  Most recently the patient has developed 1-2+ proteinuria on office visits.  A recent 24 hour urine done one week previously was pertinent for 1 g in a 24 hour period.  The patient did have a one hour GTT which was abnormal at 150 but three hour GTT confirmatory test was normal with a fasting blood sugar of 93.  The patient was noted to have an abnormal AFP triple screen, placing her at 1/230 risk of Down syndrome.  After appropriate counseling at that time she declined genetic amniocentesis.  The patient was fully aware that ultrasound itself is not diagnostic or exclusive for the diagnosis of Down syndrome.  The patient is noted to be  morbidly obese.  Prepregnancy weight 344 pounds.  Most recent weight 396 pounds.  ALLERGIES:  No known drug allergies.  PAST MEDICAL HISTORY:  As stated previously, no prior hospitalizations or treatment for chronic hypertension.  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 She discontinued this early in the pregnancy.  The father of the baby is named Shine Scrogham.  PHYSICAL EXAMINATION:  GENERAL:  Very obese black female.  VITAL SIGNS:  Height 5 feet 3 inches.  Prepregnancy weight 344 pounds, most recent weight 396 pounds.  Blood pressure 126/88, pulse rate 80, respiratory rate 20.  HEENT:  Negative.  NECK:  No adenopathy.  Neck supple.  Thyroid not palpable.  LUNGS:  Clear.  CARDIOVASCULAR:  Regular rate and rhythm.  ABDOMEN:  Soft, nontender.  No surgical scars identified.  Fundal height is 44 cms.  Vertex presentation by Leopolds maneuver, confirmed by digital examination.  EXTREMITIES:  Pretibial edema 1+ consistent with her obesity.  PELVIC:  Normal external genitalia.  No lesions or ulcerations identified. The pelvis is very deep.  The cervix is difficult to reach but is ascertained to be less than 1 cm dilated with the presenting part well applied to the cervix, but not engaged in the pelvis.  ASSESSMENT:  1. Intrauterine pregnancy at 38 weeks.  2. Morbid obesity.  3. Chronic hypertension.  PLAN:  The patient now requires induction of labor as the risk of continuation of the pregnancy is greater than any possible benefits of prolonging the pregnancy.  The patient will be initially treated with a Foley bulb catheter for mechanical ripening of the cervix.  Hopefully this will serve a dual purpose in that with ripening of the cervix and hopefully dilatation we may proceed with amniotomy on May 30, 2001, which will allow continuous monitoring of the infant utilizing fetal scalp electrode during active labor. The patient  does plan on utilizing the pediatrician on-call.  She will be bottle feeding.  She would like to utilize oral contraceptives post-delivery. Dictated by:   Langley Gauss, M.D. Attending Physician:  Jeri Cos DD:  05/29/01 TD:  05/30/01 Job: [id number]

## 2010-09-12 NOTE — Op Note (Signed)
[facility]  Patient:    Crystal Escobar, Crystal Escobar Visit Number: [id number] MRN: [id number]          Service Type: OBS Location: 4A A428 01 Attending Physician:  Jeri Cos. Dictated by:   Langley Gauss, M.D. Proc. Date: 05/29/01 Admit Date:  05/29/2001                             Operative Report  DIAGNOSES: 1. A 38 week intrauterine pregnancy. 2. Chronic essential benign hypertension preceding pregnancy.  PROCEDURE PERFORMED:  Placement of Foley bulb for mechanical ripening of cervix prior to induction of labor performed by Dr. Roylene Reason. Lisette Grinder.  COMPLICATIONS:  None.  SUMMARY:  Patient presented to [facility].  She was placed on the external fetal monitor.  She was noted to have a reactive nonstress test with fetal heart rate accelerations noted.  There were no fetal heart rate decelerations noted.  No uterine activity identified on the fetal monitor. Procedure was discussed with the patient.  She was placed in the dorsal lithotomy position.  Large speculum is utilized to visualize the cervix. There is some difficulty visualizing the cervix secondary to patients morbid obesity, resulting collapsing of the vaginal side walls, as well as the very deep nature of the vagina requiring pushing pressure to have the speculum reach deep enough.  The cervix was poorly visualized.  Only the anterior lip was visible.  Thus, a single tooth tenaculum is utilized to grasp the cervix at 12 oclock.  With this manipulation I was able to pass the cervix into field of view.  An initial attempt at placement of a 16 French Foley catheter is unsuccessful as the cervix is noted to be less than 1 cm dilated.  The sponge stick is then used to gently dilate the visible endocervical and ectocervical os.  This is done very gently to prevent any tissue trauma.  The 6 French Foley catheter is then inserted and inflated to a volume of 50 cc of sterile normal saline.   Gentle traction is then placed on the catheter to allow mechanical ripening.  Patient tolerated the procedure very well. Following the procedure she is again returned to the lateral supine position at which time continuous fetal monitoring is reinitiated.  Plan on removing the catheter the a.m. of May 30, 2001. Dictated by:   Langley Gauss, M.D. Attending Physician:  Jeri Cos. DD:  05/29/01 TD:  05/30/01 Job: [id number]

## 2010-09-12 NOTE — Op Note (Signed)
[facility]  Patient:    Crystal Escobar, Crystal Escobar Visit Number: [id number] MRN: [id number]          Service Type: OBS Location: 4A A428 01 Attending Physician:  Jeri Cos. Dictated by:   Langley Gauss, M.D. Proc. Date: 05/30/01 Admit Date:  05/29/2001                             Operative Report  DIAGNOSES: 1. A 38 week intrauterine pregnancy. 2. Chronic benign essential hypertension preceding the pregnancy. 3. Mild pregnancy induced hypertension. 4. Failure to progress secondary to cephalopelvic disproportion. 5. Morbid obesity.  PROCEDURE PERFORMED:  Primary low transverse cesarean section, delivery of a 7 pound 13 ounce female infant.  SURGEON:  Dr. Roylene Reason. Elvera Bicker BLOOD LOSS:  800 cc.  SPECIMEN:  Arterial cord gas and cord blood to pathology.  Placenta is examined and noted to be apparently intact with a three vessel umbilical cord.  ANESTHESIA:  Spinal.  PEDIATRICIAN:  Dr. Lubertha South  DRAINS:  Foley catheter to straight drainage, JP catheter placed within the subcutaneous space.  OPERATIVE PROCEDURE:  Patient was admitted for induction of labor at [redacted] weeks gestation with the findings of chronic hypertension.  She has been treated with labetalol 100 mg p.o. b.i.d.  She is noted upon admission on this occasion to have 3+ proteinuria.  Thus, even though her cervix was noted to be unfavorable for induction, the risks of continuing the pregnancy were outweighed by the benefits of induction at [redacted] weeks gestation with the findings of chronic hypertension.  Patient was thus taken to the operating room.  Foley had been sterilely placed to straight drainage on the fourth floor.  Patient was placed in a seated position at which time a spinal analgesic was administered without complications by Minerva Areola, CRNA. Patient was then returned to the operating room table with slight left lateral tilt.  Fetal heart tones were again  confirmed in the 150s.  Patient was sterilely prepped and draped in the usual manner.  Due to patients morbid obesity, silk tape was applied to the lateral portion of patients large panniculus and this was used to elevate the large panniculus by connecting it to the anesthesia ______.  After assurance of adequate surgical analgesia a sharp knife was then used to incise a midline abdominal incision which was dissected down to the fascia plane utilizing the sharp knife.  As the fascia was identified it was then dissected off the underlying rectus muscle in the midline and our fascial incision is extended superiorly and inferiorly the entire length of our skin incision.  The rectus muscles are then bluntly separated in the midline.  The peritoneal cavity is atraumatically bluntly entered at the superior most portion of the incision.  Peritoneal incision extended superiorly and inferiorly.  Inferiorly we directly visualized the bladder to avoid its accidental injury.  Bladder blade is then placed.  Lower uterine segment is identified.  A sharp Metzenbaum scissors is used to dissect a bladder flap off the vesicouterine fold in the avascular plane.  This is then bluntly and sharply dissected off the anterior lower uterine segment.  A sharp knife is then used to score a low transverse uterine incision.  Amniotic sac is encountered in the midline with findings of clear amniotic fluid.  My index fingers are used to bluntly extend the low transverse uterine incision bilaterally.  At this point in time my  right hand reached into the uterine cavity.  The infant was noted to be vertex presentation, but in an LOT position.  I was able to flex the infants head and elevate it to the level of the uterine incision at which time the reusable Silastic suction connected to wall suction is placed on the infants vertex.  Proper application is confirmed prior to gentle retraction which then results in delivery of  the infants vertex through the low transverse uterine incision without extension. Mouth and nares of the infant were bulb suctioned of clear amniotic fluid. Gentle traction combined with renewed fundal pressure results in delivery of the remainder of the infant without difficulty.  The umbilical cord was then milked towards the infant.  Cord is doubly clamped and cut and the infant is handed to the awaiting pediatrician, Dr. Lubertha South.  A spontaneous and vigorous breathe and cry is noted.  Arterial cord gas and cord blood are then obtained from the umbilical cord.  Gentle traction on the placenta results in separation which upon examination appears to be an intact three vessel placenta.  The uterus is then exteriorized.  Interior exploration reveals no retained placenta fragments.  The uterine incision is noted to not have extended, thus the uterine incision is easily repaired utilizing 0 chromic in a running locked fashion, second layer resulting in reapproximation of the bladder flap.  Excellent hemostasis is assured along the entirety of the uterine incision.  Cul-de-sac is then irrigated free of all clots.  Tubes and ovaries noted to be normal in appearance.  Uterus returned to the pelvic cavity.  Gutters are then irrigated free of all clots.  Peritoneal edges are grasped using Kelly clamps.  Sponge, needle, and instrument counts are correct x2 at this point.  The peritoneal layer is then closed utilizing 0 chromic in a running fashion.  The midline fascial incision is then closed with a secure closure using double stranded looped #1 ______ suture resulting in excellent suture closure.  There are no subcutaneous bleeders identified.  A JP drain is then placed within the subcutaneous space with a separate exit wound to the right apex of the incision.  JP drain is sutured into place separately.  Three interrupted #1 Maxon suture is then placed through and through the skin edges to act  as retention type sutures.  This allows easy stapling of the entirety of the skin incision to complete the closure.  A total of 20 cc of 0.5%  bupivacaine is then injected subcutaneously along the entirety of the incision to facilitate postoperative analgesia.  Patient tolerated the procedure very well.  She had an excellent spinal block.  With the assistance of the operating room staff she is rolled over onto the stretcher, continuing to drain clear yellow urine.  Patient is taken to the recovery room in table condition at which time operative findings discussed with the patients awaiting husband on the fourth floor.  The patient does plan on bottle feeding and will be utilizing [facility].  Due to the findings of mild pregnancy induced hypertension, the patient will be continued on magnesium sulfate infusion postoperatively as seizure prophylaxis. Dictated by:   Langley Gauss, M.D. Attending Physician:  Jeri Cos. DD:  05/31/01 TD:  05/31/01 Job: [id number]

## 2010-09-12 NOTE — Op Note (Signed)
Crystal Escobar, Crystal Escobar               ACCOUNT NO.:  [id number]   MEDICAL RECORD NO.:  [id number]          PATIENT TYPE:  AMB   LOCATION:  DAY                           FACILITY:  [facility]   PHYSICIAN:  J. Darreld Mclean, M.D. DATE OF BIRTH:  Mar 29, 1971   DATE OF PROCEDURE:  07/02/2005  DATE OF DISCHARGE:                                 OPERATIVE REPORT   PREOPERATIVE DIAGNOSIS:  Tear of lateral meniscus of left knee.   POSTOPERATIVE DIAGNOSIS:  Tear of lateral meniscus of left knee, plus  partial anterior cruciate ligament tear, degenerative joint disease changes.   PROCEDURE:  Operative arthroscopy of the left knee, partial lateral  meniscectomy, debridement of the partial anterior cruciate ligament tear,  debridement of articular surface.   ANESTHESIA:  Spinal.   SURGEON:  J. Darreld Mclean, M.D.   Tourniquet was used. Please see anesthesia record for tourniquet time. No  drains.   INDICATIONS:  The patient is a 40 year old female with pain and tenderness  in her left knee who has had giving way and tenderness. MRI shows tear of  the lateral meniscus. She has not improved with conservative treatment.  Surgery is recommended.   The patient was seen in the holding area. She identified the left knee as  the correct surgical site. I placed a mark on the left knee. She placed a  mark on the left knee.   It should be noted prior to surgery risks and imponderables of the procedure  were discussed with the patient, and she agreed with the procedure and  understood the outpatient nature and the possibility of further degenerative  changes over time.   She was taken back to the operating room. She was given general anesthesia,  placed supine on the operating room table. A leg holder and tourniquet  placed deflated left upper thigh. She was prepped and draped in the usual  manner. We had a time out procedure, identified the patient, again  identified we are doing the left knee for  arthroscopy. The leg was elevated  and wrapped circumferentially with Esmarch bandage. Tourniquet inflated to  300 mmHg. Esmarch bandage removed. Inflow cannula inserted medially.  Lactated ringers instilled in the knee by an infusion pump. Arthroscope  inserted laterally. Knee systematically examined.   FINDINGS:  Suprapatellar pouch had some mild synovitis. There was some mild  grade 2 changes around the patella. Medially, the articular surface looks  normal. The medial meniscus looked normal. The ACL had fraying of the  anterior portion, and there was a partial tear. Lateral meniscus had a  severe degenerative tear of the posterior horn with grade 4 changes of the  articular surface. There is complete loss of articular surface in the  posterior portion on the lateral side. The meniscus had a complex tear.  Meniscus laterally was normal and anterior there were no loose bodies.   Using meniscal shaver, the meniscus was cut at the posterior horn. The  popliteus tendon could be visualized easily. Good contour was obtained.  Then, I used a laser to get a finer cut and a finer shape  to the meniscus  and also used the laser to debrided the articular surface. Showed grade 4  changes again on the tibial plateau laterally and grade 3 changes on the  femoral condyle. I also used the laser to help around the ACL and a meniscal  shaver to debride the partial tear. Knee was systematically reexamined. No  new pathology found. Knee irrigated with remaining part of lactated ringers.  Wounds reapproximated with 3-0 nylon in an interrupted vertical mattress  manner, plus the new Ethilon Dermabond product. Sterile dressing was  applied. The patient tolerated the procedure well. Please refer to  anesthesia record for tourniquet time. Prescription given for Tylox for  pain. I will her in the office in approximately 10 days to 2 weeks. Physical  therapy has been arranged. If any difficulties, she is to  contact me through  the office or hospital beeper system.           ______________________________  Shela Commons. Darreld Mclean, M.D.     JWK/[id number]  D:  07/02/2005  T:  07/03/2005  Job:  [id number]

## 2011-01-26 LAB — STREP A DNA PROBE: Group A Strep Probe: NEGATIVE

## 2012-01-12 ENCOUNTER — Ambulatory Visit (INDEPENDENT_AMBULATORY_CARE_PROVIDER_SITE_OTHER): Payer: BC Managed Care – PPO | Admitting: Obstetrics and Gynecology

## 2012-01-12 ENCOUNTER — Encounter: Payer: Self-pay | Admitting: Obstetrics and Gynecology

## 2012-01-12 VITALS — BP 110/78 | HR 92 | Ht 63.0 in | Wt 367.0 lb

## 2012-01-12 DIAGNOSIS — N63 Unspecified lump in unspecified breast: Secondary | ICD-10-CM

## 2012-01-12 DIAGNOSIS — N920 Excessive and frequent menstruation with regular cycle: Secondary | ICD-10-CM

## 2012-01-12 DIAGNOSIS — Z01419 Encounter for gynecological examination (general) (routine) without abnormal findings: Secondary | ICD-10-CM

## 2012-01-12 DIAGNOSIS — Z124 Encounter for screening for malignant neoplasm of cervix: Secondary | ICD-10-CM

## 2012-01-12 MED ORDER — IBUPROFEN 600 MG PO TABS: 600.0000 mg | ORAL_TABLET | Freq: Four times a day (QID) | ORAL | Status: DC | PRN

## 2012-01-12 NOTE — Progress Notes (Signed)
Last Pap: 2011 WNL: No, HPV per pt Regular Periods:yes, but pt states they last 7-10 days with blood clots Contraception: BTL  Monthly Breast exam:no Tetanus<43yrs:no Nl.Bladder Function:yes Daily BMs:yes Healthy Diet:yes Calcium:yes Mammogram:no Date of Mammogram: n/a Exercise:yes Have often Exercise: sometimes Seatbelt: yes Abuse at home: no Stressful work:yes Sigmoid-colonoscopy: n/a Bone Density: No PCP: Dr. Dorothyann Peng Change in PMH: DM , obesity Change in FMH:n/a BP 110/78  Pulse 92  Ht 5\' 3"  (1.6 m)  Wt 367 lb (166.47 kg)  BMI 65.01 kg/m2  LMP 12/14/2011 Pt with complaints:she has periods every months that lasts for seven to ten days.  No chest pain or SOB.  She uses two pads per hour.  No bleeding disorders Physical Examination: General appearance - alert, well appearing, and in no distress Mental status - normal mood, behavior, speech, dress, motor activity, and thought processes Neck - supple, no significant adenopathy,  thyroid exam: thyroid is normal in size without nodules or tenderness Chest - clear to auscultation, no wheezes, rales or rhonchi, symmetric air entry Heart - normal rate and regular rhythm Abdomen - soft, nontender, nondistended, no masses or organomegaly. Obese well healed vertical incision Breasts - breasts appear normal, no suspicious masses, no skin or nipple changes or axillary nodes.  rEd lacy rash on both breasts that are macular Pelvic - normal external genitalia, vulva, vagina, cervix, uterus and adnexa Rectal - rectal exam not indicated Back exam - full range of motion, no tenderness, palpable spasm or pain on motion Neurological - alert, oriented, normal speech, no focal findings or movement disorder noted Musculoskeletal - no joint tenderness, deformity or swelling Extremities - no edema, redness or tenderness in the calves or thighs Skin - normal coloration and turgor, no rashes, no suspicious skin lesions noted Routine  exam Menorrhagia obestiy Pap sent yes Mammogram due no but pt to breast center to evalutate mass BTL used for contraception RT for SHG/EMBX and pelvic US

## 2012-01-12 NOTE — Patient Instructions (Signed)
Menorrhagia   Menorrhagia is when a menstrual period is heavier or longer than normal.  HOME CARE   Only take medicine as told by your doctor.   Do not take aspirin 1 week before or during your period. Aspirin can make the bleeding worse.   Lay down for a while if you change your tampon or pad more than once in 2 hours. This may help lessen the bleeding.   Take any iron pills as told by your doctor. Heavy bleeding may cause you to lack iron in your body.   Eat a healthy diet and foods with iron. These foods include leafy green vegetables, meat, liver, eggs, and whole grain breads and cereals.   Eat foods that are high in vitamin C. These include oranges, orange juice, and grapefruits. Vitamin C can help your body take in more iron.   Do not try to lose weight. Wait until the heavy bleeding has stopped and your iron level is normal.  GET HELP RIGHT AWAY IF:   You get a fever.   You have trouble breathing.   You bleed even more heavily than usual and pass blood clots.   You feel dizzy, weak, or pass out (faint).   You need to change your tampon or pad more than once an hour.   You feel sick to your stomach (nauseous), throw up (vomit), or have watery poop (diarrhea).   You have problems from medicine.  MAKE SURE YOU:    Understand these instructions.   Will watch your condition.   Will get help right away if you are not doing well or get worse.  Document Released: 01/21/2008 Document Revised: 04/02/2011 Document Reviewed: 01/21/2008  [employer] Patient Information 2012 [employer].

## 2012-01-12 NOTE — Addendum Note (Signed)
Addended by: Rolla Plate on: 01/12/2012 11:12 AM   Modules accepted: Orders

## 2012-01-19 ENCOUNTER — Telehealth: Payer: Self-pay

## 2012-01-19 MED ORDER — METRONIDAZOLE 500 MG PO TABS: 500.0000 mg | ORAL_TABLET | Freq: Two times a day (BID) | ORAL | Status: DC

## 2012-01-19 NOTE — Telephone Encounter (Signed)
Spoke with pt rgd labs informed pap wnl pap showed bv need rx advised pt fx sent to pharm pt voice understanding

## 2012-01-19 NOTE — Telephone Encounter (Signed)
Message copied by Rolla Plate on Tue Jan 19, 2012 10:28 AM ------      Message from: Jaymes Graff      Created: Mon Jan 18, 2012  9:01 PM       Please tell pt BV was found on her pap smear.  She can be treated with either Metrogel one applicator in vagina QHS for five nights or Flagyl 500mg  one tablet twice a day for seven days.

## 2012-01-27 ENCOUNTER — Telehealth: Payer: Self-pay | Admitting: Obstetrics and Gynecology

## 2012-01-27 DIAGNOSIS — N63 Unspecified lump in unspecified breast: Secondary | ICD-10-CM

## 2012-01-27 NOTE — Telephone Encounter (Signed)
Order entered for b/l diagnostic mmg.  Pt stated that it is both breasts around nipple area.  ld

## 2012-01-28 ENCOUNTER — Telehealth: Payer: Self-pay | Admitting: Obstetrics and Gynecology

## 2012-01-28 DIAGNOSIS — N644 Mastodynia: Secondary | ICD-10-CM

## 2012-02-02 NOTE — Telephone Encounter (Signed)
Orders done for b/l u/s for pt.  ld

## 2012-02-05 ENCOUNTER — Ambulatory Visit
Admission: RE | Admit: 2012-02-05 | Discharge: 2012-02-05 | Disposition: A | Discharge status: Home / Self Care | Payer: BC Managed Care – PPO | Source: Ambulatory Visit | Attending: Obstetrics and Gynecology | Admitting: Obstetrics and Gynecology

## 2012-02-05 DIAGNOSIS — N63 Unspecified lump in unspecified breast: Secondary | ICD-10-CM

## 2012-02-05 DIAGNOSIS — N644 Mastodynia: Secondary | ICD-10-CM

## 2012-02-08 ENCOUNTER — Telehealth: Payer: Self-pay

## 2012-02-08 NOTE — Telephone Encounter (Signed)
Spoke with pt rgd msg informed refer to dermatology pt declines referral

## 2012-02-08 NOTE — Telephone Encounter (Signed)
Message copied by Rolla Plate on Mon Feb 08, 2012  8:47 AM ------      Message from: Jaymes Graff      Created: Sun Feb 07, 2012  9:31 PM       Please refer this pt to dermatology for evaluation of breast rash      ----- Message -----         From: Rad Results In Interface         Sent: 02/05/2012   9:59 AM           To: Michael Litter, MD

## 2012-02-08 NOTE — Telephone Encounter (Signed)
Lm on vm tcb rgd referral pt has appt 02/15/12 at 1:30 at [facility] [phone]

## 2012-02-08 NOTE — Telephone Encounter (Signed)
Message copied by Rolla Plate on Mon Feb 08, 2012  8:55 AM ------      Message from: Jaymes Graff      Created: Sun Feb 07, 2012  9:31 PM       Please refer this pt to dermatology for evaluation of breast rash      ----- Message -----         From: Rad Results In Interface         Sent: 02/05/2012   9:59 AM           To: Michael Litter, MD

## 2012-04-07 ENCOUNTER — Other Ambulatory Visit: Payer: Self-pay | Admitting: Obstetrics and Gynecology

## 2012-04-08 NOTE — Telephone Encounter (Signed)
THIS PT IS REQUESTING IBUPROFEN. SHE SAW YOU ON 9/13 AND YOU GAVE HER A 30 DAY SUPPLY THEN. CAN PT HAVE RX?

## 2012-12-20 ENCOUNTER — Encounter: Payer: Self-pay | Admitting: Adult Health

## 2012-12-22 ENCOUNTER — Encounter: Payer: Self-pay | Admitting: Adult Health

## 2012-12-22 ENCOUNTER — Ambulatory Visit (INDEPENDENT_AMBULATORY_CARE_PROVIDER_SITE_OTHER): Payer: BC Managed Care – PPO | Admitting: Adult Health

## 2012-12-22 VITALS — BP 120/80 | Ht 63.0 in | Wt 339.0 lb

## 2012-12-22 DIAGNOSIS — N938 Other specified abnormal uterine and vaginal bleeding: Secondary | ICD-10-CM

## 2012-12-22 DIAGNOSIS — N949 Unspecified condition associated with female genital organs and menstrual cycle: Secondary | ICD-10-CM

## 2012-12-22 HISTORY — DX: Other specified abnormal uterine and vaginal bleeding: N93.8

## 2012-12-22 NOTE — Patient Instructions (Addendum)

## 2012-12-22 NOTE — Progress Notes (Signed)
Subjective:     Patient ID: Crystal Escobar, female   DOB: 10/26/1970, 42 y.o.   MRN: [id number]  HPI Crystal Escobar is a 42 year old black female married new to this practice complaining of bleeding up to 3 weeks out of the month and heavy and has clots at times and some cramps.Has labs and was OK.She has had her tubes tied.History of abnormal and cone, last pap normal last year per pt.  Review of Systems Positives in HPI Reviewed past medical,surgical, social and family history. Reviewed medications and allergies.     Objective:   Physical Exam BP 120/80  Ht 5\' 3"  (1.6 m)  Wt 339 lb (153.769 kg)  BMI 60.07 kg/m2  LMP 12/03/2012   Skin warm and dry.Pelvic: external genitalia is normal in appearance, vagina: black to brown discharge without odor, cervix:smooth, uterus: normal size, shape and contour, non tender, no masses felt, adnexa: no masses or tenderness noted.It is difficult exam secondary to abdominal girth.  Assessment:     DUB    Plan:     Return in 12 days for pelvic US and see me, will decide treatment options after [place].   Review handout on DUB

## 2013-01-03 ENCOUNTER — Encounter: Payer: Self-pay | Admitting: Adult Health

## 2013-01-03 ENCOUNTER — Ambulatory Visit (INDEPENDENT_AMBULATORY_CARE_PROVIDER_SITE_OTHER): Payer: BC Managed Care – PPO | Admitting: Adult Health

## 2013-01-03 ENCOUNTER — Ambulatory Visit (INDEPENDENT_AMBULATORY_CARE_PROVIDER_SITE_OTHER): Payer: BC Managed Care – PPO

## 2013-01-03 VITALS — BP 122/80 | Ht 63.0 in | Wt 331.0 lb

## 2013-01-03 DIAGNOSIS — N938 Other specified abnormal uterine and vaginal bleeding: Secondary | ICD-10-CM

## 2013-01-03 DIAGNOSIS — N949 Unspecified condition associated with female genital organs and menstrual cycle: Secondary | ICD-10-CM

## 2013-01-03 DIAGNOSIS — N925 Other specified irregular menstruation: Secondary | ICD-10-CM

## 2013-01-03 MED ORDER — MEGESTROL ACETATE 40 MG PO TABS: ORAL_TABLET | ORAL | Status: DC

## 2013-01-03 NOTE — Progress Notes (Signed)
Subjective:     Patient ID: Crystal Escobar, female   DOB: [birth year], 42 y.o.   MRN: [id number]  HPI Mercie is in for [place] for DUB.  Review of Systems See HPI Reviewed past medical,surgical, social and family history. Reviewed medications and allergies.     Objective:   Physical Exam BP 122/80  Ht 5\' 3"  (1.6 m)  Wt 331 lb (150.141 kg)  BMI 58.65 kg/m2  LMP 01/03/2013   Reviewed [place] with pt and discussed with Dr Despina Hidden, has thickened endometrium of 15.8 mm ?clot or tissue not polyp, discussed with pt trying megace then ablation.  Assessment:    DUB    Plan:     Rx megace 40 mg #45 3 x 5 days then 2 x 5 days then 1 daily with 1 refill   follow up with Dr Despina Hidden in 4 weeks Review handout on ablation

## 2013-01-03 NOTE — Patient Instructions (Addendum)
Endometrial Ablation Endometrial ablation removes the lining of the uterus (endometrium). It is usually a same day, outpatient treatment. Ablation helps avoid major surgery (such as a hysterectomy). A hysterectomy is removal of the cervix and uterus. Endometrial ablation has less risk and complications, has a shorter recovery period and is less expensive. After endometrial ablation, most women will have little or no menstrual bleeding. You may not keep your fertility. Pregnancy is no longer likely after this procedure but if you are pre-menopausal, you still need to use a reliable method of birth control following the procedure because pregnancy can occur. REASONS TO HAVE THE PROCEDURE MAY INCLUDE:  Heavy periods.  Bleeding that is causing anemia.  Anovulatory bleeding, very irregular, bleeding.  Bleeding submucous fibroids (on the lining inside the uterus) if they are smaller than 3 centimeters. REASONS NOT TO HAVE THE PROCEDURE MAY INCLUDE:  You wish to have more children.  You have a pre-cancerous or cancerous problem. The cause of any abnormal bleeding must be diagnosed before having the procedure.  You have pain coming from the uterus.  You have a submucus fibroid larger than 3 centimeters.  You recently had a baby.  You recently had an infection in the uterus.  You have a severe retro-flexed, tipped uterus and cannot insert the instrument to do the ablation.  You had a Cesarean section or deep major surgery on the uterus.  The inner cavity of the uterus is too large for the endometrial ablation instrument. RISKS AND COMPLICATIONS   Perforation of the uterus.  Bleeding.  Infection of the uterus, bladder or vagina.  Injury to surrounding organs.  Cutting the cervix.  An air bubble to the lung (air embolus).  Pregnancy following the procedure.  Failure of the procedure to help the problem requiring hysterectomy.  Decreased ability to diagnose cancer in the lining of  the uterus. BEFORE THE PROCEDURE  The lining of the uterus must be tested to make sure there is no pre-cancerous or cancer cells present.  Medications may be given to make the lining of the uterus thinner.  Ultrasound may be used to evaluate the size and look for abnormalities of the uterus.  Future pregnancy is not desired. PROCEDURE  There are different ways to destroy the lining of the uterus.   Resectoscope - radio frequency-alternating electric current is the most common one used.  Cryotherapy - freezing the lining of the uterus.  Heated Free Liquid - heated salt (saline) solution inserted into the uterus.  Microwave - uses high energy microwaves in the uterus.  Thermal Balloon - a catheter with a balloon tip is inserted into the uterus and filled with heated fluid. Your caregiver will talk with you about the method used in this clinic. They will also instruct you on the pros and cons of the procedure. Endometrial ablation is performed along with a procedure called operative hysteroscopy. A narrow viewing tube is inserted through the birth canal (vagina) and through the cervix into the uterus. A tiny camera attached to the viewing tube (hysteroscope) allows the uterine cavity to be shown on a TV monitor during surgery. Your uterus is filled with a harmless liquid to make the procedure easier. The lining of the uterus is then removed. The lining can also be removed with a resectoscope which allows your surgeon to cut away the lining of the uterus under direct vision. Usually, you will be able to go home within an hour after the procedure. HOME CARE INSTRUCTIONS   Do   not drive for 24 hours.  No tampons, douching or intercourse for 2 weeks or until your caregiver approves.  Rest at home for 24 to 48 hours. You may then resume normal activities unless told differently by your caregiver.  Take your temperature two times a day for 4 days, and record it.  Take any medications your  caregiver has ordered, as directed.  Use some form of contraception if you are pre-menopausal and do not want to get pregnant. Bleeding after the procedure is normal. It varies from light spotting and mildly watery to bloody discharge for 4 to 6 weeks. You may also have mild cramping. Only take over-the-counter or prescription medicines for pain, discomfort, or fever as directed by your caregiver. Do not use aspirin, as this may aggravate bleeding. Frequent urination during the first 24 hours is normal. You will not know how effective your surgery is until at least 3 months after the surgery. SEEK IMMEDIATE MEDICAL CARE IF:   Bleeding is heavier than a normal menstrual cycle.  An oral temperature above 102 F (38.9 C) develops.  You have increasing cramps or pains not relieved with medication or develop belly (abdominal) pain which does not seem to be related to the same area of earlier cramping and pain.  You are light headed, weak or have fainting episodes.  You develop pain in the shoulder strap areas.  You have chest or leg pain.  You have abnormal vaginal discharge.  You have painful urination. Document Released: 02/21/2004 Document Revised: 07/06/2011 Document Reviewed: 05/21/2007 [facility] Patient Information 2014 [place], [place]. Take megace  Follow up in 4 weeks

## 2013-01-31 ENCOUNTER — Ambulatory Visit: Payer: BC Managed Care – PPO | Admitting: Obstetrics & Gynecology

## 2014-02-26 ENCOUNTER — Encounter: Payer: Self-pay | Admitting: Adult Health

## 2014-04-27 HISTORY — PX: CARPAL TUNNEL RELEASE: SHX101

## 2015-03-28 ENCOUNTER — Other Ambulatory Visit: Payer: Self-pay | Admitting: Obstetrics & Gynecology

## 2015-04-01 LAB — CYTOLOGY - PAP

## 2016-04-10 ENCOUNTER — Other Ambulatory Visit: Payer: Self-pay | Admitting: Obstetrics and Gynecology

## 2016-04-13 LAB — CYTOLOGY - PAP

## 2017-05-26 DIAGNOSIS — G5762 Lesion of plantar nerve, left lower limb: Secondary | ICD-10-CM | POA: Insufficient documentation

## 2017-05-26 DIAGNOSIS — G5602 Carpal tunnel syndrome, left upper limb: Secondary | ICD-10-CM | POA: Insufficient documentation

## 2017-05-26 DIAGNOSIS — G5603 Carpal tunnel syndrome, bilateral upper limbs: Secondary | ICD-10-CM | POA: Insufficient documentation

## 2018-02-03 ENCOUNTER — Encounter: Payer: Self-pay | Admitting: Internal Medicine

## 2018-02-03 ENCOUNTER — Ambulatory Visit (INDEPENDENT_AMBULATORY_CARE_PROVIDER_SITE_OTHER): Payer: BLUE CROSS/BLUE SHIELD | Admitting: Internal Medicine

## 2018-02-03 VITALS — BP 130/78 | HR 101 | Temp 98.1°F | Ht 63.0 in | Wt 359.8 lb

## 2018-02-03 DIAGNOSIS — N760 Acute vaginitis: Secondary | ICD-10-CM

## 2018-02-03 DIAGNOSIS — I10 Essential (primary) hypertension: Secondary | ICD-10-CM | POA: Diagnosis not present

## 2018-02-03 DIAGNOSIS — R519 Headache, unspecified: Secondary | ICD-10-CM

## 2018-02-03 DIAGNOSIS — R51 Headache: Secondary | ICD-10-CM

## 2018-02-03 DIAGNOSIS — R0683 Snoring: Secondary | ICD-10-CM | POA: Diagnosis not present

## 2018-02-03 DIAGNOSIS — Z6841 Body Mass Index (BMI) 40.0 and over, adult: Secondary | ICD-10-CM

## 2018-02-03 LAB — POCT URINALYSIS DIPSTICK
BILIRUBIN UA: NEGATIVE
Glucose, UA: NEGATIVE
Ketones, UA: NEGATIVE
NITRITE UA: NEGATIVE
PH UA: 5.5 (ref 5.0–8.0)
PROTEIN UA: NEGATIVE
Spec Grav, UA: 1.01 (ref 1.010–1.025)
UROBILINOGEN UA: 0.2 U/dL

## 2018-02-03 MED ORDER — FLUTICASONE PROPIONATE 50 MCG/ACT NA SUSP: 2.0000 | Freq: Every day | NASAL | 2 refills | Status: DC

## 2018-02-03 NOTE — Patient Instructions (Signed)

## 2018-02-04 ENCOUNTER — Other Ambulatory Visit (HOSPITAL_COMMUNITY)
Admission: RE | Admit: 2018-02-04 | Discharge: 2018-02-04 | Disposition: A | Discharge status: Home / Self Care | Payer: BLUE CROSS/BLUE SHIELD | Source: Ambulatory Visit | Attending: Internal Medicine | Admitting: Internal Medicine

## 2018-02-04 ENCOUNTER — Telehealth: Payer: Self-pay | Admitting: Internal Medicine

## 2018-02-04 DIAGNOSIS — N898 Other specified noninflammatory disorders of vagina: Secondary | ICD-10-CM | POA: Insufficient documentation

## 2018-02-05 ENCOUNTER — Encounter: Payer: Self-pay | Admitting: Internal Medicine

## 2018-02-05 NOTE — Progress Notes (Signed)
Subjective:     Patient ID: Crystal Escobar , female    DOB: 1970-09-07 , 47 y.o.   MRN: [id number]   Headache   This is a recurrent problem. The current episode started 1 to 4 weeks ago. The problem occurs intermittently. The problem has been unchanged. The pain is located in the bilateral and frontal region. The pain does not radiate. The quality of the pain is described as dull and throbbing.  Vaginal Discharge  The patient's primary symptoms include vaginal discharge. This is a new problem. The current episode started in the past 7 days. The problem occurs daily. The problem has been unchanged. The pain is mild. She is not pregnant. Associated symptoms include headaches.     Past Medical History:  Diagnosis Date  . Abnormal Pap smear 2011  . Diabetes mellitus   . DUB (dysfunctional uterine bleeding) 12/22/2012  . Hypertension       Current Outpatient Medications:  .  atorvastatin (LIPITOR) 20 MG tablet, Take 20 mg by mouth daily., Disp: , Rfl: 5 .  hydrochlorothiazide (MICROZIDE) 12.5 MG capsule, Take 12.5 mg by mouth daily., Disp: , Rfl: 0 .  ibuprofen (ADVIL,MOTRIN) 600 MG tablet, TAKE ONE TABLET BY MOUTH EVERY 6 HOURS AS NEEDED FOR PAIN, Disp: 30 tablet, Rfl: 0 .  losartan (COZAAR) 50 MG tablet, Take 50 mg by mouth daily., Disp: , Rfl: 0 .  OZEMPIC, 0.25 OR 0.5 MG/DOSE, 2 MG/1.5ML SOPN, INJECT 0.5 MG SUBCUTANEOUSLY ON THE SAME DAY EACH WEEK IN THE ABDOMEN THIGHS OR UPPER ARM ROTATING INJECTION SITES, Disp: , Rfl: 3 .  fluticasone (FLONASE) 50 MCG/ACT nasal spray, Place 2 sprays into both nostrils daily., Disp: 1 g, Rfl: 2   No Known Allergies   Review of Systems  Constitutional: Negative.   HENT: Negative.   Respiratory: Negative.   Cardiovascular: Negative.   Genitourinary: Positive for vaginal discharge.  Neurological: Positive for headaches.  Psychiatric/Behavioral: Negative.      Today's Vitals   02/03/18 1612 02/03/18 1624 02/03/18 1627 02/03/18 1629  BP: 130/78      Pulse: (!) 101     Temp: 98.1 F (36.7 C)     TempSrc: Oral     SpO2: 96% 95% 96% 95%  Weight: (!) 359 lb 12.8 oz (163.2 kg)     Height: 5\' 3"  (1.6 m)      Body mass index is 63.74 kg/m.   Objective:  Physical Exam  Constitutional: She is oriented to person, place, and time. She appears well-developed and well-nourished.  HENT:  Head: Normocephalic and atraumatic.  Cardiovascular: Normal rate, regular rhythm, normal heart sounds and intact distal pulses.  Pulmonary/Chest: Effort normal and breath sounds normal.  Neurological: She is alert and oriented to person, place, and time.  Skin: Skin is warm and dry.        Assessment And Plan:     Nonintractable episodic headache, unspecified headache type - SHE IS ENCOURAGED TO TAKE MG NIGHTLY. SHE MAY ALSO BENEFIT FROM MENTHOLATED CREAM TO HER SHOULDERS/TEMPLE.  - Plan: Ambulatory referral to Neurology  Acute vaginitis - I WILL CHECK A NUSWAB TODAY.  - Plan: POCT Urinalysis Dipstick (81002), NuSwab Vaginitis Plus (VG+), Cervicovaginal ancillary only  [place]ings - I WILL REFER HER FOR A SLEEP STUDY. SHE HAS SEVERAL RF FOR OSA - INCL. HTN, LARGE NECK CIRCUMFERENCE AND MORBID OBESITY.  - Plan: Ambulatory referral to Neurology  Essential hypertension, benign - FAIR CONTROL. SHE WILL CONTINUE WITH CURRENT MEDS TODAY. SHE  IS ENCOURAGED TO AVOID ADDING SALT TO HER FOODS.   Morbid obesity ([place]) - SHE IS ENCOURAGED TO INCORPORATE MORE EXERCISE INTO HER DAILY ROUTINE. SHE IS ENCOURAGED TO EXERCISE NO LESS THAN 5-6 DAYS WEEKLY FOR 30-45 MINUTES.          [name], MD

## 2018-02-07 LAB — CERVICOVAGINAL ANCILLARY ONLY
BACTERIAL VAGINITIS: NEGATIVE
CANDIDA VAGINITIS: POSITIVE — AB
CHLAMYDIA, DNA PROBE: NEGATIVE
Neisseria Gonorrhea: NEGATIVE
TRICH (WINDOWPATH): NEGATIVE

## 2018-02-07 NOTE — Progress Notes (Signed)
VAGINAL SWAB IS POS FOR CANDIDA - YEAST. PLS CALL IN DIFLUCAN 150MG  TO TAKE TODAY, REPEAT IN 48 HOURS. #2/ZERO REFILLS.

## 2018-02-11 ENCOUNTER — Other Ambulatory Visit: Payer: Self-pay

## 2018-02-11 ENCOUNTER — Telehealth: Payer: Self-pay

## 2018-02-11 MED ORDER — FLUCONAZOLE 150 MG PO TABS: ORAL_TABLET | ORAL | 0 refills | Status: DC

## 2018-02-11 NOTE — Telephone Encounter (Signed)
Left the pt a message to call back for lab results.  

## 2018-02-11 NOTE — Telephone Encounter (Signed)
-----   Message from Glendale Chard, MD sent at 02/07/2018 10:54 PM EDT ----- VAGINAL SWAB IS POS FOR CANDIDA - YEAST. PLS CALL IN DIFLUCAN 150MG  TO TAKE TODAY, REPEAT IN 48 HOURS. #2/ZERO REFILLS.

## 2018-02-13 ENCOUNTER — Other Ambulatory Visit: Payer: Self-pay | Admitting: Internal Medicine

## 2018-03-21 ENCOUNTER — Ambulatory Visit: Payer: Self-pay | Admitting: Internal Medicine

## 2018-04-11 ENCOUNTER — Encounter: Payer: Self-pay | Admitting: Neurology

## 2018-04-11 ENCOUNTER — Ambulatory Visit (INDEPENDENT_AMBULATORY_CARE_PROVIDER_SITE_OTHER): Payer: BLUE CROSS/BLUE SHIELD | Admitting: Neurology

## 2018-04-11 VITALS — BP 127/86 | HR 89 | Ht 62.0 in | Wt 360.0 lb

## 2018-04-11 DIAGNOSIS — I10 Essential (primary) hypertension: Secondary | ICD-10-CM

## 2018-04-11 DIAGNOSIS — E8881 Metabolic syndrome: Secondary | ICD-10-CM | POA: Diagnosis not present

## 2018-04-11 DIAGNOSIS — Z6841 Body Mass Index (BMI) 40.0 and over, adult: Secondary | ICD-10-CM

## 2018-04-11 DIAGNOSIS — F17200 Nicotine dependence, unspecified, uncomplicated: Secondary | ICD-10-CM | POA: Diagnosis not present

## 2018-04-11 DIAGNOSIS — R519 Headache, unspecified: Secondary | ICD-10-CM

## 2018-04-11 DIAGNOSIS — E669 Obesity, unspecified: Secondary | ICD-10-CM

## 2018-04-11 DIAGNOSIS — R51 Headache: Secondary | ICD-10-CM

## 2018-04-11 DIAGNOSIS — E662 Morbid (severe) obesity with alveolar hypoventilation: Secondary | ICD-10-CM | POA: Diagnosis not present

## 2018-04-11 NOTE — Progress Notes (Signed)
SLEEP MEDICINE CLINIC   Provider:  Larey Seat, MD   Primary Care Physician:  Glendale Chard, MD   Referring Provider: Glendale Chard, MD    Chief Complaint  Patient presents with  . New Patient (Initial Visit)    pt alone, rm 11. pt here today to assess need for sleep study. pt dont sleep well. pt states that she wakes up at least 2-3 times during the night. pt snores in sleep. her husband has told her that he has witnessed a time where she looked like she stopped breathing in sleep. never had a sleep study completed.    HPI:  Crystal Escobar is a 47 y.o. female patient who is seen on 04-11-2018 in a referral from Dr. Baird Cancer for a sleep evaluation.   Chief complaint according to patient : Crystal Escobar reports that she has not been as concerned about her risk of sleep apnea, but that her primary care physician, Dr. Gertie Exon, MD, had arranged for this visit today.  She has reached a body mass index of 63.74 kg/m and does have an increase in headache frequency, the headache is located in the bilateral and frontal region and does not radiate it may be hypertension related.   She has essential hypertension of a benign kind and is taking medications for that.  She has also been diagnosed with hypercholesterolemia and diabetes.   She had carpal tunnel surgery in 2016 and 2 C-sections one in 2003 and one in 2011.  Sleep habits are as follows: dinner time, around 5-6 PM,  No exercise, feels too tired, bedtime at 10 PM- no tv, no lights. Falls asleep after 5 minutes to 30 minutes.  She is sleeping with 2 pillows in supine position. The patient reports going to the bathroom once sometimes 2 times at night, she has no difficulties going back to sleep however. Her spouse has noted that she is snoring and he has noted apneas as well.  She is not sure how often these occur.  When she wakes up in the morning she relies on an alarm set to 5:30 AM.  She feels not refreshed or restored in the  morning, wakes with a very dry mouth and  with headaches.   Sleep medical history and family sleep history: no family history.  No tonsillectomy , no TBI, No neck injury or surgery.    Social history: married, with 2 children. Working regular daytime hours- at [employer]. Smoker - 1-2 cigars a day, ETOH - not more 2 drinks / 3 month. caffeine- coffee in AM. Iced Tea when she goes out.  HS graduate - some college.     Review of Systems: Out of a complete 14 system review, the patient complains of only the following symptoms, and all other reviewed systems are negative. She has a normal heart rate and regular rhythm and she has report that she snores. Non restorative sleep.   Epworth score 13/ 24 , Fatigue severity score 49/ 63   , depression score ; denied.    Social History   Socioeconomic History  . Marital status: Married    Spouse name: Not on file  . Number of children: Not on file  . Years of education: Not on file  . Highest education level: Not on file  Occupational History  . Not on file  Social Needs  . Financial resource strain: Not on file  . Food insecurity:    Worry: Not on file    Inability:  Not on file  . Transportation needs:    Medical: Not on file    Non-medical: Not on file  Tobacco Use  . Smoking status: Current Some Day Smoker    Packs/day: 1.00    Types: Cigars  . Smokeless tobacco: Never Used  . Tobacco comment: 1 cigar a day  Substance and Sexual Activity  . Alcohol use: Yes    Comment: occasionally   . Drug use: No  . Sexual activity: Yes    Partners: Male    Birth control/protection: Surgical    Comment: BTL  Lifestyle  . Physical activity:    Days per week: Not on file    Minutes per session: Not on file  . Stress: Not on file  Relationships  . Social connections:    Talks on phone: Not on file    Gets together: Not on file    Attends religious service: Not on file    Active member of club or organization: Not on file    Attends  meetings of clubs or organizations: Not on file    Relationship status: Not on file  . Intimate partner violence:    Fear of current or ex partner: Not on file    Emotionally abused: Not on file    Physically abused: Not on file    Forced sexual activity: Not on file  Other Topics Concern  . Not on file  Social History Narrative  . Not on file    Family History  Problem Relation Age of Onset  . Seizures Father   . Heart attack Mother   . Hypertension Mother   . Diabetes Mother   . Hypertension Sister   . Heart murmur Son   . Sickle cell anemia Maternal Aunt   . Hypertension Maternal Aunt     Past Medical History:  Diagnosis Date  . Abnormal Pap smear 2011  . Diabetes mellitus   . DUB (dysfunctional uterine bleeding) 12/22/2012  . HLD (hyperlipidemia)   . Hypertension     Past Surgical History:  Procedure Laterality Date  . CARPAL TUNNEL RELEASE Right 2016  . CESAREAN SECTION    . KNEE SURGERY    . TUBAL LIGATION      Current Outpatient Medications  Medication Sig Dispense Refill  . atorvastatin (LIPITOR) 20 MG tablet Take 20 mg by mouth daily.  5  . cetirizine (ZYRTEC) 10 MG tablet Take 10 mg by mouth daily.    . hydrochlorothiazide (MICROZIDE) 12.5 MG capsule TAKE 1 CAPSULE BY MOUTH ONCE DAILY 90 capsule 0  . ibuprofen (ADVIL,MOTRIN) 600 MG tablet TAKE ONE TABLET BY MOUTH EVERY 6 HOURS AS NEEDED FOR PAIN 30 tablet 0  . losartan (COZAAR) 50 MG tablet TAKE 1 TABLET BY MOUTH ONCE DAILY 90 tablet 0  . medroxyPROGESTERone (PROVERA) 10 MG tablet Take 10 mg by mouth daily. Take on 1 tablet daily on DAY 1-10 of each month    . OZEMPIC, 0.25 OR 0.5 MG/DOSE, 2 MG/1.5ML SOPN INJECT 0.5 MG SUBCUTANEOUSLY ON THE SAME DAY EACH WEEK IN THE ABDOMEN THIGHS OR UPPER ARM ROTATING INJECTION SITES  3   No current facility-administered medications for this visit.     Allergies as of 04/11/2018  . (No Known Allergies)    Vitals: BP 127/86   Pulse 89   Ht 5\' 2"  (1.575 m)   Wt  (!) 360 lb (163.3 kg)   BMI 65.84 kg/m  Last Weight:  Wt Readings from Last 1 Encounters:  04/11/18 (!) 360 lb (163.3 kg)   JJH:ERDE mass index is 65.84 kg/m.     Last Height:   Ht Readings from Last 1 Encounters:  04/11/18 5\' 2"  (1.575 m)    Physical exam:  General: The patient is awake, alert and appears not in acute distress. The patient is well groomed. Head: Normocephalic, atraumatic. Neck is supple. Mallampati 4,  neck circumference:16" .  Nasal airflow patent , Retrognathia is mildly seen.  Cardiovascular:  Regular rate and rhythm, without  murmurs or carotid bruit, and without distended neck veins. Respiratory: Lungs are clear to auscultation. Skin:  Without evidence of edema, or rash Trunk: BMI is over 60 .  Neurologic exam : The patient is awake and alert, oriented to place and time.   Memory subjective described as intact.  Attention span & concentration ability appears normal.  Speech is fluent,  without dysarthria, but mild dysphonia.  Mood and affect are appropriate.  Cranial nerves: Pupils are equal and briskly reactive to light. Funduscopic exam without evidence of pallor or edema. Extraocular movements in vertical and horizontal planes intact and without nystagmus. Visual fields by finger perimetry are intact. Hearing to finger rub intact.   Facial sensation intact to fine touch.  Facial motor strength is symmetric and tongue and uvula move midline. Shoulder shrug was symmetrical.   Motor exam:  Normal tone, muscle bulk and symmetric strength in all extremities. Sensory:  Fine touch, pinprick and vibration were tested in all extremities. Proprioception tested in the upper extremities was normal. Coordination: Rapid alternating movements in the fingers/hands was normal. Finger-to-nose maneuver  normal without evidence of ataxia, dysmetria or tremor. Gait and station: Patient walks without assistive device and is able unassisted to climb up to the exam table.  Strength within normal limits.  Stance is stable and normal.   Deep tendon reflexes: in the  upper and lower extremities are symmetric and intact.    Assessment:  After physical and neurologic examination, review of laboratory studies,  Personal review of imaging studies, reports of other /same  Imaging studies, results of polysomnography and / or neurophysiology testing and pre-existing records as far as provided in visit., my assessment is :   1) Very high risk of obesity Hypoventilation. BMI 65.84 kg/m2. Witnessed apnea and snoring. Supine sleeper.  Co-morbidities of super-obesity, HTN, DM, smoking.  Her symptoms are exessive daytime sleepiness, fatigue and morning headaches.   The patient was advised of the nature of the diagnosed disorder , the treatment options and the  risks for general health and wellness arising from not treating the condition.   I spent more than 45  minutes of face to face time with the patient.  Greater than 50% of time was spent in counseling and coordination of care. We have discussed the diagnosis and differential and I answered the patient's questions.  Crystal Escobar reports that she has thought about weight loss surgery but she is aware that she still has to lose weight in order to be safer to undergo surgery.  She has attended a seminar but she has not sent her paperwork in yet.  I would encourage her to consider a medical weight management clinic first and to see if she can bring her BMI into the 40-50 range.  This will make the surgical success rate higher and the risk of surgery lower. She is a candidate for medical weight management and for surgerical weight loss with a BMI of 35 or higher and dependent on her  comorbidities ; which include diabetes mellitus, hypertension, possible obesity hypoventilation and almost certainly apnea as witnessed by her husband in form of snoring, crescendo snoring and pausing in her breath.       Plan:  Treatment plan and  additional workup :  This patient is at very high risk of CO2 retention, I like for her to have an attended Sleep split study- CO2 needed. Hypoxemia watch.   IF [employer] denies attended SPLIT study, will use HST.     Larey Seat, MD 47/[id number]/4650, 3:54 AM  Certified in Neurology by ABPN Certified in [place] by [facility] [address] [place], [place] [address]

## 2018-05-19 ENCOUNTER — Other Ambulatory Visit: Payer: Self-pay | Admitting: Internal Medicine

## 2018-06-04 ENCOUNTER — Ambulatory Visit: Payer: BLUE CROSS/BLUE SHIELD | Admitting: Internal Medicine

## 2018-06-06 ENCOUNTER — Other Ambulatory Visit (HOSPITAL_COMMUNITY): Payer: Self-pay | Admitting: General Surgery

## 2018-06-06 ENCOUNTER — Other Ambulatory Visit: Payer: Self-pay | Admitting: General Surgery

## 2018-06-06 ENCOUNTER — Ambulatory Visit (INDEPENDENT_AMBULATORY_CARE_PROVIDER_SITE_OTHER): Payer: BLUE CROSS/BLUE SHIELD | Admitting: Internal Medicine

## 2018-06-06 ENCOUNTER — Encounter: Payer: Self-pay | Admitting: Internal Medicine

## 2018-06-06 VITALS — BP 128/70 | HR 95 | Temp 98.2°F | Ht 62.0 in | Wt 358.8 lb

## 2018-06-06 DIAGNOSIS — M79672 Pain in left foot: Secondary | ICD-10-CM | POA: Diagnosis not present

## 2018-06-06 DIAGNOSIS — R202 Paresthesia of skin: Secondary | ICD-10-CM

## 2018-06-06 DIAGNOSIS — I10 Essential (primary) hypertension: Secondary | ICD-10-CM | POA: Diagnosis not present

## 2018-06-06 DIAGNOSIS — E1165 Type 2 diabetes mellitus with hyperglycemia: Secondary | ICD-10-CM | POA: Diagnosis not present

## 2018-06-06 DIAGNOSIS — Z6841 Body Mass Index (BMI) 40.0 and over, adult: Secondary | ICD-10-CM

## 2018-06-06 MED ORDER — GABAPENTIN 100 MG PO CAPS: 100.0000 mg | ORAL_CAPSULE | Freq: Every day | ORAL | 2 refills | Status: DC

## 2018-06-06 NOTE — Patient Instructions (Signed)
Diabetic Neuropathy Diabetic neuropathy refers to nerve damage that is caused by diabetes (diabetes mellitus). Over time, people with diabetes can develop nerve damage throughout the body. There are several types of diabetic neuropathy:  Peripheral neuropathy. This is the most common type of diabetic neuropathy. It causes damage to nerves that carry signals between the spinal cord and other parts of the body (peripheral nerves). This usually affects nerves in the feet and legs first, and may eventually affect the hands and arms. The damage affects the ability to sense touch or temperature.  Autonomic neuropathy. This type causes damage to nerves that control involuntary functions (autonomic nerves). These nerves carry signals that control: ? Heartbeat. ? Body temperature. ? Blood pressure. ? Urination. ? Digestion. ? Sweating. ? Sexual function. ? Response to changing blood sugar (glucose) levels.  Focal neuropathy. This type of nerve damage affects one area of the body, such as an arm, a leg, or the face. The injury may involve one nerve or a small group of nerves. Focal neuropathy can be painful and unpredictable, and occurs most often in older adults with diabetes. This often develops suddenly, but usually improves over time and does not cause long-term problems.  Proximal neuropathy. This type of nerve damage affects the nerves of the thighs, hips, buttocks, or legs. It causes severe pain, weakness, and muscle death (atrophy), usually in the thigh muscles. It is more common among older men and people who have type 2 diabetes. The length of recovery time may vary. What are the causes? Peripheral, autonomic, and focal neuropathies are caused by diabetes that is not well controlled with treatment. The cause of proximal neuropathy is not known, but it may be caused by inflammation related to uncontrolled blood glucose levels. What are the signs or symptoms? Peripheral neuropathy Peripheral  neuropathy develops slowly over time. When the nerves of the feet and legs no longer work, you may experience:  Burning, stabbing, or aching pain in the legs or feet.  Pain or cramping in the legs or feet.  Loss of feeling (numbness) and inability to feel pressure or pain in the feet. This can lead to: ? Thick calluses or sores on areas of constant pressure. ? Ulcers. ? Reduced ability to feel temperature changes.  Foot deformities.  Muscle weakness.  Loss of balance or coordination. Autonomic neuropathy The symptoms of autonomic neuropathy vary depending on which nerves are affected. Symptoms may include:  Problems with digestion, such as: ? Nausea or vomiting. ? Poor appetite. ? Bloating. ? Diarrhea or constipation. ? Trouble swallowing. ? Losing weight without trying to.  Problems with the heart, blood and lungs, such as: ? Dizziness, especially when standing up. ? Fainting. ? Shortness of breath. ? Irregular heartbeat.  Bladder problems, such as: ? Trouble starting or stopping urination. ? Leaking urine. ? Trouble emptying the bladder. ? Urinary tract infections (UTIs).  Problems with other body functions, such as: ? Sweat. You may sweat too much or too little. ? Temperature. You might get hot easily. Or, you might feel cold more than usual. ? Sexual function. Men may not be able to get or maintain an erection. Women may have vaginal dryness and difficulty with arousal. Focal neuropathy Symptoms affect only one area of the body. Common symptoms include:  Numbness.  Tingling.  Burning pain.  Prickling feeling.  Very sensitive skin.  Weakness.  Inability to move (paralysis).  Muscle twitching.  Muscles getting smaller (wasting).  Poor coordination.  Double or blurred vision. Proximal   neuropathy  Sudden, severe pain in the hip, thigh, or buttocks. Pain may spread from the back into the legs (sciatica).  Pain and numbness in the arms and  legs.  Tingling.  Loss of bladder control or bowel control.  Weakness and wasting of thigh muscles.  Difficulty getting up from a seated position.  Abdominal swelling.  Unexplained weight loss. How is this diagnosed? Diagnosis usually involves reviewing your medical history and any symptoms you have. Diagnosis varies depending on the type of neuropathy your health care provider suspects. Peripheral neuropathy Your health care provider will check areas that are affected by your nervous system (neurologic exam), such as your reflexes, how you move, and what you can feel. You may have other tests, such as:  Blood tests.  Removal and examination of fluid that surrounds the spinal cord (lumbar puncture).  CT scan.  MRI.  A test to check the nerves that control muscles (electromyogram, EMG).  Tests of how quickly messages pass through your nerves (nerve conduction velocity tests).  Removal of a small piece of nerve to be examined under a microscope (biopsy). Autonomic neuropathy You may have tests, such as:  Tests to measure your blood pressure and heart rate. This may include monitoring you while you are safely secured to an exam table that moves you from a lying position to an upright position (table tilt test).  Breathing tests to check your lungs.  Tests to check how food moves through the digestive system (gastric emptying tests).  Blood, sweat, or urine tests.  Ultrasound of your bladder.  Spinal fluid tests. Focal neuropathy This condition may be diagnosed with:  A neurologic exam.  CT scan.  MRI.  EMG.  Nerve conduction velocity tests. Proximal neuropathy There is no test to diagnose this type of neuropathy. You may have tests to rule out other possible causes of this type of neuropathy. Tests may include:  X-rays of your spine and lumbar region.  Lumbar puncture.  MRI. How is this treated? The goal of treatment is to keep nerve damage from getting  worse. The most important part of treatment is keeping your blood glucose level and your A1C level within your target range by following your diabetes management plan. Over time, maintaining lower blood glucose levels helps lessen symptoms. In some cases, you may need prescription pain medicine. Follow these instructions at home:  Lifestyle   Do not use any products that contain nicotine or tobacco, such as cigarettes and e-cigarettes. If you need help quitting, ask your health care provider.  Be physically active every day. Include strength training and balance exercises.  Follow a healthy meal plan.  Work with your health care provider to manage your blood pressure. General instructions  Follow your diabetes management plan as directed. ? Check your blood glucose levels as directed by your health care provider. ? Keep your blood glucose in your target range as directed by your health care provider. ? Have your A1C level checked at least two times a year, or as often as told by your health care provider.  Take over the counter and prescription medicines only as told by your health care provider. This includes insulin and diabetes medicine.  Do not drive or use heavy machinery while taking prescription pain medicines.  Check your skin and feet every day for cuts, bruises, redness, blisters, or sores.  Keep all follow up visits as told by your health care provider. This is important. Contact a health care provider if:  by your health care provider.  ? Keep your blood glucose in your target range as directed by your health care provider.  ? Have your A1C level checked at least two times a year, or as often as told by your health care provider.  · Take over the counter and prescription medicines only as told by your health care provider. This includes insulin and diabetes medicine.  · Do not drive or use heavy machinery while taking prescription pain medicines.  · Check your skin and feet every day for cuts, bruises, redness, blisters, or sores.  · Keep all follow up visits as told by your health care provider. This is important.  Contact a health care provider if:  · You have burning, stabbing, or aching pain in your legs or feet.  · You are unable to feel pressure or pain in your feet.  · You develop problems with digestion, such as:  ? Nausea.  ? Vomiting.  ? Bloating.  ? Constipation.  ? Diarrhea.  ? Abdominal pain.  · You have difficulty with urination, such as inability:  ? To control when you urinate (incontinence).  ? To completely empty the bladder (retention).  · You have palpitations.  · You feel dizzy, weak, or faint when you  stand up.  Get help right away if:  · You cannot urinate.  · You have sudden weakness or loss of coordination.  · You have trouble speaking.  · You have pain or pressure in your chest.  · You have an irregular heart beat.  · You have sudden inability to move a part of your body.  Summary  · Diabetic neuropathy refers to nerve damage that is caused by diabetes. It can affect nerves throughout the entire body, causing numbness and pain in the arms, legs, digestive tract, heart, and other body systems.  · Keep your blood glucose level and your blood pressure in your target range, as directed by your health care provider. This can help prevent neuropathy from getting worse.  · Check your skin and feet every day for cuts, bruises, redness, blisters, or sores.  · Do not use any products that contain nicotine or tobacco, such as cigarettes and e-cigarettes. If you need help quitting, ask your health care provider.  This information is not intended to replace advice given to you by your health care provider. Make sure you discuss any questions you have with your health care provider.  Document Released: 06/22/2001 Document Revised: 05/26/2017 Document Reviewed: 05/18/2016  Elsevier Interactive Patient Education © 2019 [employer]

## 2018-06-06 NOTE — Progress Notes (Signed)
Subjective:     Patient ID: Crystal Escobar , female    DOB: 25-Sep-1970 , 48 y.o.   MRN: [id number]   Chief Complaint  Patient presents with  . Foot Pain    left    HPI  Foot Pain  This is a recurrent problem. The current episode started 1 to 4 weeks ago. The problem occurs intermittently. The problem has been waxing and waning. Associated symptoms include numbness. The symptoms are aggravated by walking. She has tried nothing for the symptoms.     Past Medical History:  Diagnosis Date  . Abnormal Pap smear 2011  . Diabetes mellitus   . DUB (dysfunctional uterine bleeding) 12/22/2012  . HLD (hyperlipidemia)   . Hypertension      Family History  Problem Relation Age of Onset  . Seizures Father   . Heart attack Mother   . Hypertension Mother   . Diabetes Mother   . Hypertension Sister   . Heart murmur Son   . Sickle cell anemia Maternal Aunt   . Hypertension Maternal Aunt      Current Outpatient Medications:  .  atorvastatin (LIPITOR) 20 MG tablet, Take 20 mg by mouth daily., Disp: , Rfl: 5 .  cetirizine (ZYRTEC) 10 MG tablet, Take 10 mg by mouth daily., Disp: , Rfl:  .  hydrochlorothiazide (MICROZIDE) 12.5 MG capsule, TAKE 1 CAPSULE BY MOUTH ONCE DAILY, Disp: 90 capsule, Rfl: 0 .  ibuprofen (ADVIL,MOTRIN) 600 MG tablet, TAKE ONE TABLET BY MOUTH EVERY 6 HOURS AS NEEDED FOR PAIN, Disp: 30 tablet, Rfl: 0 .  losartan (COZAAR) 50 MG tablet, TAKE 1 TABLET BY MOUTH ONCE DAILY, Disp: 90 tablet, Rfl: 0 .  medroxyPROGESTERone (PROVERA) 10 MG tablet, Take 10 mg by mouth daily. Take on 1 tablet daily on DAY 1-10 of each month, Disp: , Rfl:  .  OZEMPIC, 0.25 OR 0.5 MG/DOSE, 2 MG/1.5ML SOPN, INJECT 0.5 MG SUBCUTANEOUSLY ON THE SAME DAY EACH WEEK IN THE ABDOMEN THIGHS OR UPPER ARM ROTATING INJECTION SITES, Disp: , Rfl: 3 .  gabapentin (NEURONTIN) 100 MG capsule, Take 1 capsule (100 mg total) by mouth at bedtime., Disp: 30 capsule, Rfl: 2   No Known Allergies   Review of Systems   Constitutional: Negative.   Respiratory: Negative.   Cardiovascular: Negative.   Gastrointestinal: Negative.   Neurological: Positive for numbness.  Psychiatric/Behavioral: Negative.      Today's Vitals   06/06/18 1155  BP: 128/70  Pulse: 95  Temp: 98.2 F (36.8 C)  TempSrc: Oral  Weight: (!) 358 lb 12.8 oz (162.8 kg)  Height: _0  (1.575 m)   Body mass index is 65.63 kg/m.   Objective:  Physical Exam Vitals signs and nursing note reviewed.  Constitutional:      Appearance: Normal appearance. She is obese.  HENT:     Head: Normocephalic and atraumatic.  Cardiovascular:     Rate and Rhythm: Normal rate and regular rhythm.     Heart sounds: Normal heart sounds.  Pulmonary:     Effort: Pulmonary effort is normal.     Breath sounds: Normal breath sounds.  Skin:    General: Skin is warm.  Neurological:     General: No focal deficit present.     Mental Status: She is alert.  Psychiatric:        Mood and Affect: Mood normal.        Behavior: Behavior normal.         Assessment And Plan:  1. Left foot pain  I will refer her to podiatry for further evaluation.   - Ambulatory referral to Podiatry  2. Paresthesia of left foot  A rx gabapentin 123m nightly was sent to the pharmacy. If her sx persist, I will consider LLE nerve conduction study. I will check vit b12 level today.   3. Essential hypertension, benign  Well controlled. She will continue with current meds. She is encouraged to avoid adding salt to her foods.   4. Uncontrolled type 2 diabetes mellitus with hyperglycemia ([place]  I will check labs as listed below. Again, importance of regular exercise was discussed with the patient.   - CMP14+EGFR - Hemoglobin A1c - Vitamin B12  5. Morbid obesity with body mass index (BMI) of 60.0 to 69.9 in adult ([facility]  Importance of achieving optimal weight to decrease risk of cardiovascular disease and cancers was discussed with the patient in full detail.  She is encouraged to start slowly - start with 10 minutes twice daily at least three to four days per week and to gradually build to 30 minutes five days weekly. She was given tips to incorporate more activity into her daily routine - take stairs when possible, park farther away from her job, grocery stores, etc.      RMaximino Greenland MD

## 2018-06-07 ENCOUNTER — Encounter: Payer: Self-pay | Admitting: Internal Medicine

## 2018-06-07 LAB — CMP14+EGFR
ALT: 30 IU/L (ref 0–32)
AST: 23 IU/L (ref 0–40)
Albumin/Globulin Ratio: 1.1 — ABNORMAL LOW (ref 1.2–2.2)
Albumin: 3.9 g/dL (ref 3.8–4.8)
Alkaline Phosphatase: 134 IU/L — ABNORMAL HIGH (ref 39–117)
BUN/Creatinine Ratio: 9 (ref 9–23)
BUN: 6 mg/dL (ref 6–24)
Bilirubin Total: <0.2 mg/dL (ref 0.0–1.2)
CALCIUM: 9.9 mg/dL (ref 8.7–10.2)
CO2: 21 mmol/L (ref 20–29)
CREATININE: 0.69 mg/dL (ref 0.57–1.00)
Chloride: 99 mmol/L (ref 96–106)
GFR calc non Af Amer: 105 mL/min/{1.73_m2} (ref >59)
GFR, EST AFRICAN AMERICAN: 122 mL/min/{1.73_m2} (ref >59)
GLOBULIN, TOTAL: 3.6 g/dL (ref 1.5–4.5)
Glucose: 186 mg/dL — ABNORMAL HIGH (ref 65–99)
POTASSIUM: 4.5 mmol/L (ref 3.5–5.2)
SODIUM: 136 mmol/L (ref 134–144)
Total Protein: 7.5 g/dL (ref 6.0–8.5)

## 2018-06-07 LAB — VITAMIN B12: VITAMIN B 12: 578 pg/mL (ref 232–1245)

## 2018-06-07 LAB — HEMOGLOBIN A1C
ESTIMATED AVERAGE GLUCOSE: 223 mg/dL
Hgb A1c MFr Bld: 9.4 % — ABNORMAL HIGH (ref 4.8–5.6)

## 2018-06-16 ENCOUNTER — Other Ambulatory Visit: Payer: Self-pay

## 2018-06-16 ENCOUNTER — Ambulatory Visit (HOSPITAL_COMMUNITY)
Admission: RE | Admit: 2018-06-16 | Discharge: 2018-06-16 | Disposition: A | Discharge status: Home / Self Care | Payer: BLUE CROSS/BLUE SHIELD | Source: Ambulatory Visit | Attending: General Surgery | Admitting: General Surgery

## 2018-06-16 ENCOUNTER — Other Ambulatory Visit (HOSPITAL_COMMUNITY): Payer: Self-pay | Admitting: General Surgery

## 2018-06-20 ENCOUNTER — Ambulatory Visit: Payer: BLUE CROSS/BLUE SHIELD | Admitting: Podiatry

## 2018-06-22 ENCOUNTER — Ambulatory Visit (INDEPENDENT_AMBULATORY_CARE_PROVIDER_SITE_OTHER): Payer: BLUE CROSS/BLUE SHIELD | Admitting: Neurology

## 2018-06-22 DIAGNOSIS — G4733 Obstructive sleep apnea (adult) (pediatric): Secondary | ICD-10-CM | POA: Diagnosis not present

## 2018-06-22 DIAGNOSIS — E662 Morbid (severe) obesity with alveolar hypoventilation: Secondary | ICD-10-CM

## 2018-06-22 DIAGNOSIS — Z6841 Body Mass Index (BMI) 40.0 and over, adult: Secondary | ICD-10-CM

## 2018-06-22 DIAGNOSIS — I10 Essential (primary) hypertension: Secondary | ICD-10-CM

## 2018-06-22 DIAGNOSIS — R519 Headache, unspecified: Secondary | ICD-10-CM

## 2018-06-22 DIAGNOSIS — R51 Headache: Secondary | ICD-10-CM

## 2018-06-22 DIAGNOSIS — E669 Obesity, unspecified: Secondary | ICD-10-CM

## 2018-06-22 DIAGNOSIS — F17200 Nicotine dependence, unspecified, uncomplicated: Secondary | ICD-10-CM

## 2018-06-22 DIAGNOSIS — E8881 Metabolic syndrome: Secondary | ICD-10-CM

## 2018-06-23 ENCOUNTER — Encounter: Payer: Self-pay | Admitting: Dietician

## 2018-06-23 ENCOUNTER — Encounter: Payer: BLUE CROSS/BLUE SHIELD | Attending: General Surgery | Admitting: Dietician

## 2018-06-23 VITALS — Ht 62.5 in | Wt 357.7 lb

## 2018-06-23 DIAGNOSIS — E669 Obesity, unspecified: Secondary | ICD-10-CM | POA: Diagnosis present

## 2018-06-23 NOTE — Patient Instructions (Signed)
   Begin taking a multivitamin and vitamin D (5,000 IU/day) if you are not already.   Begin working through the [employer] discussed today, starting with:  o Practice CHEWING your food (aim for applesauce consistency)   See you next month at your 1st supervised visit!

## 2018-06-23 NOTE — Progress Notes (Signed)
Bariatric Pre-Op Nutrition Assessment Medical Nutrition Therapy  Appt Start Time: 10:15am  End time: 11:15am  Patient was seen on 06/23/2018 for Pre-Operative Nutrition Assessment. Assessment and letter of approval faxed to [facility] Surgery Bariatric Surgery Program coordinator on 06/23/2018.   Planned surgery: Sleeve Gastrectomy Pt expectation of surgery: to relieve knee pain, prevent potential health complications in the future, and feel better overall Pt expectation of dietitian: none stated  Anthropometrics  Start weight at [facility]: 357.7 lbs (date: 06/23/2018) Height: 62.5 in BMI: 64.4 kg/m2    Clinical  Medical Hx: obesity, HTN, hyperlipidemia, T2DM Surgeries: c-sections, knee surgery, tubal ligation Medications: medroxyprogesterone, gabapentin, hydrochlorothiazide, losartan, Ozempic  Allergies: N/A  Psychosocial/Lifestyle Name pronounced "Vonda." Pt arrived to visit today with her husband. Pt lives with her husband and 2 children, and works as a Scientist, water quality at [employer]. Pt is somewhat reserved.    24-Hr Dietary Recall First Meal: usually skips (chicken biscuit)  Snack: pickles (or fruit)  Second Meal: baked chicken + mashed potatoes  Snack: chips (or gummy bears, or nuts)  Third Meal: 2 hot dogs + chips  Snack: none  Beverages: Diet Pepsi + water   Food & Nutrition Related Hx Dietary Hx: Pt states she likes rice, noodles, fruit, cereal (Frosted Flakes). Dislikes onions, peppers, carrots. Starting to avoid/eat less of pork and red meat. Pt states she has tried various diet pills/ appetite suppressants as well as low calorie diet and Slim Fast. Pt states she believes that her portion sizes are an issue sometimes.  Estimated Daily Fluid Intake: 64-96 oz Supplements: none GI / Other Notable Symptoms: none  Physical Activity  Current average weekly physical activity: ADLs  Estimated Energy Needs Calories: 1600 Carbohydrate: 180g Protein: 120g Fat: 44g   Pre-Op Goals  Reviewed with the Patient . Track food and beverage intake (try MyFitness Pal or the Baritastic app) . Make healthy food choices while monitoring portion sizes . Avoid concentrated sugars and fried foods . Keep fat & sugar in the single digits per serving on food labels . Practice CHEWING your food (aim for applesauce consistency) . Practice not drinking 15 minutes before, during, and 30 minutes after each meal and snack . Avoid all carbonated beverages (ex: soda, sparkling beverages)  . Limit caffeinated beverages (ex: coffee, tea, energy drinks) . Avoid all sugar-sweetened beverages (ex: regular soda, sports drinks)  . Avoid alcohol  . Consume 3 meals per day or try to eat every 3-5 hours . Make a list of non-food related activities . Aim for 64-100 ounces of FLUID daily (with at least half of fluid intake being plain water)  . Aim for at least 60-80 grams of PROTEIN daily . Look for a liquid protein source that contains ?15 g protein and ?5 g carbohydrate (ex: shakes, drinks, shots) . Physical activity is an important part of a healthy lifestyle so keep it moving! The goal is to reach 150 minutes of exercise per week, including cardiovascular and weight baring activity.  *Goals that are bolded indicate the pt would like to start working towards these  Handouts Provided Include  . Bariatric Surgery handouts (Nutrition Visits, Pre-Op Goals, Protein Shakes, Vitamins & Minerals, Support Group 2020 Schedule)  Learning Style & Readiness for Change Teaching method utilized: Visual & Auditory  Demonstrated degree of understanding via: Teach Back  Barriers to learning/adherence to lifestyle change: Contemplative Stage of Change  RD's Notes for Next Visit . MyPlate and Meal Ideas . Maybe work on breakfast ideas too (protein shake?)  Next Steps Supervised Weight Loss (SWL) Visits Needed: 6  Patient is to return to [facility] in 1 month for 1st SWL Visit.  Patient is to call [facility] to enroll in  Pre-Op Class (>2 weeks before surgery) and Post-Op Class (2 weeks after surgery) for further nutrition education when surgery date is scheduled.

## 2018-06-30 ENCOUNTER — Ambulatory Visit (INDEPENDENT_AMBULATORY_CARE_PROVIDER_SITE_OTHER): Payer: BLUE CROSS/BLUE SHIELD

## 2018-06-30 ENCOUNTER — Encounter: Payer: Self-pay | Admitting: Podiatry

## 2018-06-30 ENCOUNTER — Encounter: Payer: Self-pay | Admitting: Neurology

## 2018-06-30 ENCOUNTER — Telehealth: Payer: Self-pay | Admitting: Neurology

## 2018-06-30 ENCOUNTER — Ambulatory Visit: Payer: BLUE CROSS/BLUE SHIELD | Admitting: Podiatry

## 2018-06-30 VITALS — BP 135/83 | HR 87 | Resp 16

## 2018-06-30 DIAGNOSIS — D3610 Benign neoplasm of peripheral nerves and autonomic nervous system, unspecified: Secondary | ICD-10-CM

## 2018-06-30 DIAGNOSIS — M2142 Flat foot [pes planus] (acquired), left foot: Secondary | ICD-10-CM

## 2018-06-30 DIAGNOSIS — M779 Enthesopathy, unspecified: Secondary | ICD-10-CM

## 2018-06-30 DIAGNOSIS — M21962 Unspecified acquired deformity of left lower leg: Secondary | ICD-10-CM

## 2018-06-30 DIAGNOSIS — Z6841 Body Mass Index (BMI) 40.0 and over, adult: Secondary | ICD-10-CM

## 2018-06-30 DIAGNOSIS — E119 Type 2 diabetes mellitus without complications: Secondary | ICD-10-CM

## 2018-06-30 DIAGNOSIS — M21961 Unspecified acquired deformity of right lower leg: Secondary | ICD-10-CM | POA: Diagnosis not present

## 2018-06-30 DIAGNOSIS — E662 Morbid (severe) obesity with alveolar hypoventilation: Secondary | ICD-10-CM | POA: Insufficient documentation

## 2018-06-30 DIAGNOSIS — M2141 Flat foot [pes planus] (acquired), right foot: Secondary | ICD-10-CM | POA: Diagnosis not present

## 2018-06-30 MED ORDER — MELOXICAM 15 MG PO TABS: 15.0000 mg | ORAL_TABLET | Freq: Every day | ORAL | 0 refills | Status: DC

## 2018-06-30 NOTE — Patient Instructions (Signed)

## 2018-06-30 NOTE — Procedures (Signed)
PATIENT'S NAME:  Crystal Escobar, Koppenhaver DOB:      1970-11-06      MRN:    [id number]     DATE OF RECORDING: 06/22/2018 REFERRING M.D.:  Glendale Chard, MD  Study Performed:   Baseline Polysomnogram HISTORY: Crystal Escobar is a 48 y.o. female patient who is seen on 04-11-2018 in a referral from Dr. Baird Cancer for a sleep evaluation.  [name] reports that she has not been as concerned about her risk of sleep apnea, but that her primary care physician, Dr. Gertie Exon, MD, had arranged for this visit today.   She has reached a body mass index of 63.74 kg/m and does have an increase in headache frequency, the headache is located in the bilateral and frontal region and does not radiate - may be hypertension related? She has also been diagnosed with hypercholesterolemia and diabetes. She is sleeping with 2 pillows in supine position. .  She feels not refreshed or restored in the morning, wakes with a very dry mouth and with headaches. Very high risk of Obesity Hypoventilation. BMI 65.84 kg/m2. Witnessed apnea and snoring. Reclined sleeper.  Co-morbidities of super-obesity, HTN, DM, smoking.  Her symptoms are excessive daytime sleepiness, fatigue and morning headaches. Diabetes, DUB, Hyperlipidemia, HTN  The patient endorsed the Epworth Sleepiness Scale at 13 points.   The patient's weight 360 pounds with a height of 62 (inches), resulting in a BMI of 66.1 kg/m2. The patient's neck circumference measured 16 inches.  CURRENT MEDICATIONS: Lipitor, Zyrtec, Microzide, Cozaar, Provera, Ozempic   PROCEDURE:  This is a multichannel digital polysomnogram utilizing the Somnostar 11.2 system.  Electrodes and sensors were applied and monitored per AASM Specifications.   EEG, EOG, Chin and Limb EMG, were sampled at 200 Hz.  ECG, Snore and Nasal Pressure, Thermal Airflow, Respiratory Effort, CPAP Flow and Pressure, Oximetry was sampled at 50 Hz. Digital video and audio were recorded.      BASELINE STUDY: Lights Out was at  22:36 and Lights On at 05:00.  Total recording time (TRT) was 384.5 minutes, with a total sleep time (TST) of 358.5 minutes.   The patient's sleep latency was 4.5 minutes.  REM latency was 175.5 minutes.  The sleep efficiency was 93.2 %.     SLEEP ARCHITECTURE: WASO (Wake after sleep onset) was 21 minutes.  There were 3 minutes in Stage N1, 201.5 minutes Stage N2, 110.5 minutes Stage N3 and 43.5 minutes in Stage REM.  The percentage of Stage N1 was .8%, Stage N2 was 56.2%, Stage N3 was 30.8% and Stage R (REM sleep) was 12.1%.  RESPIRATORY ANALYSIS:  There were a total of 136 respiratory events:  0 obstructive apneas, 1 central apnea and 0 mixed apneas with a total of 1 apneas and an apnea index (AI) of 0.2 /hour. There were 135 hypopneas with a hypopnea index of 22.6 /hour.    The total APNEA/HYPOPNEA INDEX (AHI) was 22.8 /hour and the total RESPIRATORY DISTURBANCE INDEX was 22.8 /hour.  51 events occurred in REM sleep and 169 events in NREM. The REM AHI was 70.3 /hour, versus a non-REM AHI of 16.2. The patient spent 358.5 minutes of total sleep time in the supine position and 0 minutes in non-supine. The supine AHI was /h 22.8 versus a non-supine AHI of 0.0/h.  OXYGEN SATURATION & C02:  The Wake baseline 02 saturation was 92%, with the lowest being 80%. Time spent below 89% saturation equaled 90 minutes.   AROUSALS:  The arousals were noted as:  36 were spontaneous, 0 were associated with PLMs, and 13 were associated with respiratory events. The patient had a total of 0 Periodic Limb Movements.    Audio and video analysis did not show any abnormal or unusual movements, complex behaviors, phonations or vocalizations.  No Nocturia. Snoring was noted. EKG in normal sinus rhythm (NSR).   IMPRESSION:  1. Moderate-severe Obstructive Sleep Apnea/ Hypopnea (OSA) with strong REM accentuation.  2. Moderate Snoring.  RECOMMENDATIONS:  1. Advise full-night, attended, CPAP titration study to optimize  therapy.     I certify that I have reviewed the entire raw data recording prior to the issuance of this report in accordance with the Standards of Accreditation of the American Academy of Sleep Medicine (AASM)    Larey Seat, MD    06-30-2018 Diplomat, American Board of Psychiatry and Neurology  Diplomat, American Board of [place] Director, [facility] [employer]

## 2018-06-30 NOTE — Telephone Encounter (Signed)
-----   Message from Larey Seat, MD sent at 06/30/2018  8:24 AM EST ----- IMPRESSION:  1. Moderate-severe Obstructive Sleep Apnea/ Hypopnea (OSA) with  strong REM accentuation.  2. Moderate Snoring.  RECOMMENDATIONS:  1. Advise full-night, attended, CPAP titration study to optimize  therapy.

## 2018-06-30 NOTE — Telephone Encounter (Signed)
Called patient to discuss sleep study results. No answer at this time. LVM for the patient to call back.  I will also send a mychart message for the patient.

## 2018-06-30 NOTE — Addendum Note (Signed)
Addended by: Larey Seat on: 06/30/2018 08:24 AM   Modules accepted: Orders

## 2018-07-01 NOTE — Progress Notes (Signed)
Subjective:   Patient ID: Crystal Escobar, female   DOB: 48 y.o.   MRN: [id number]   HPI 48 year old female presents the office today with concerns of pain to the left foot.  She states that it hurts in the ball of her foot she gets numbness and burning into the toes mostly the second third toe but sometimes the fourth toe.  She saw her primary care physician she was started gabapentin but this was not helpful so she discontinued this on her own.  The pain is worse at night times.  She states that over the last month her symptoms have improved some.  She did see another physician previously and she was given injections which did help.  She last saw her primary care physician and her A1c was up to 9.4.  She is started on medication and she states her blood sugars been improving.   Review of Systems  All other systems reviewed and are negative.  Past Medical History:  Diagnosis Date  . Abnormal Pap smear 2011  . Diabetes mellitus   . DUB (dysfunctional uterine bleeding) 12/22/2012  . HLD (hyperlipidemia)   . Hypertension     Past Surgical History:  Procedure Laterality Date  . CARPAL TUNNEL RELEASE Right 2016  . CESAREAN SECTION    . KNEE SURGERY    . TUBAL LIGATION       Current Outpatient Medications:  .  atorvastatin (LIPITOR) 20 MG tablet, Take 20 mg by mouth daily., Disp: , Rfl: 5 .  cetirizine (ZYRTEC) 10 MG tablet, Take 10 mg by mouth daily., Disp: , Rfl:  .  gabapentin (NEURONTIN) 100 MG capsule, Take 1 capsule (100 mg total) by mouth at bedtime., Disp: 30 capsule, Rfl: 2 .  hydrochlorothiazide (MICROZIDE) 12.5 MG capsule, TAKE 1 CAPSULE BY MOUTH ONCE DAILY, Disp: 90 capsule, Rfl: 0 .  ibuprofen (ADVIL,MOTRIN) 600 MG tablet, TAKE ONE TABLET BY MOUTH EVERY 6 HOURS AS NEEDED FOR PAIN, Disp: 30 tablet, Rfl: 0 .  losartan (COZAAR) 50 MG tablet, TAKE 1 TABLET BY MOUTH ONCE DAILY, Disp: 90 tablet, Rfl: 0 .  medroxyPROGESTERone (PROVERA) 10 MG tablet, Take 10 mg by mouth daily.  Take on 1 tablet daily on DAY 1-10 of each month, Disp: , Rfl:  .  meloxicam (MOBIC) 15 MG tablet, Take 1 tablet (15 mg total) by mouth daily., Disp: 14 tablet, Rfl: 0 .  OZEMPIC, 0.25 OR 0.5 MG/DOSE, 2 MG/1.5ML SOPN, INJECT 0.5 MG SUBCUTANEOUSLY ON THE SAME DAY EACH WEEK IN THE ABDOMEN THIGHS OR UPPER ARM ROTATING INJECTION SITES, Disp: , Rfl: 3  No Known Allergies      Objective:  Physical Exam  General: AAO x3, NAD  Dermatological: Skin is warm, dry and supple bilateral. Nails x 10 are well manicured; remaining integument appears unremarkable at this time. There are no open sores, no preulcerative lesions, no rash or signs of infection present.  Vascular: Dorsalis Pedis artery and Posterior Tibial artery pedal pulses are 2/4 bilateral with immedate capillary fill time. Pedal hair growth present. No varicosities and no lower extremity edema present bilateral. There is no pain with calf compression, swelling, warmth, erythema.   Neruologic: Grossly intact via light touch bilateral.  Protective threshold with Semmes Wienstein monofilament intact to all pedal sites bilateral.   Musculoskeletal: There is tenderness mostly in the second interspace of the left left foot mildly to the third interspace.  She is reporting burning specifically in the second and third toes mostly but also  the fourth toe at times.  There is no area pinpoint tenderness.  There is a decrease in medial arch upon weightbearing.  Muscular strength 5/5 in all groups tested bilateral.  Gait: Unassisted, Nonantalgic.       Assessment:   48 year old female with left foot nerve symptoms, likely neuroma    Plan:  -Treatment options discussed including all alternatives, risks, and complications -Etiology of symptoms were discussed -X-rays were obtained and reviewed with the patient.  Flatfoot is present. Adductus foot type is also evident.  No evidence of acute fracture. -Given her symptoms being localized I am not certain  that this is neuropathy arising is more for neuroma.  Also it is more biomechanical in nature.  Dispensed neuroma pads.  I will try to get her orthotics we will check insurance coverage for her.  Also she is already discontinued gabapentin was not helpful.  We discussed in decreasing the dose if needed but she wants to hold off on this.  She was open about further injections. Prescribed mobic. Discussed side effects of the medication and directed to stop if any are to occur and call the office.  We will hold other anti-inflammatories currently.  Trula Slade DPM

## 2018-07-07 ENCOUNTER — Telehealth: Payer: Self-pay | Admitting: Podiatry

## 2018-07-07 NOTE — Telephone Encounter (Signed)
Pt returned my call and had to leave a message.   I called pt back and explained that the orthotics are not covered and I gave her the option to think about of the payment plan (100.00 down and making payments) She is not wanting to do it now and will discuss other options at the next appt with you in april

## 2018-07-11 ENCOUNTER — Encounter: Payer: Self-pay | Admitting: Physician Assistant

## 2018-07-11 ENCOUNTER — Telehealth: Payer: BLUE CROSS/BLUE SHIELD | Admitting: Physician Assistant

## 2018-07-11 DIAGNOSIS — B373 Candidiasis of vulva and vagina: Secondary | ICD-10-CM

## 2018-07-11 DIAGNOSIS — B3731 Acute candidiasis of vulva and vagina: Secondary | ICD-10-CM

## 2018-07-11 MED ORDER — FLUCONAZOLE 150 MG PO TABS: 150.0000 mg | ORAL_TABLET | Freq: Once | ORAL | 0 refills | Status: AC

## 2018-07-11 NOTE — Progress Notes (Signed)
We are sorry that you are not feeling well. Here is how we plan to help! Based on what you shared with me it looks like you: May have a yeast vaginosis    Crystal Escobar,  Diabetes can put you at hight risk of getting these frequent yeast infection, specially if ou diabetes is uncontrolled. Follow up with your doctor to see if your diabetes or diabetes medications may be contributing to these frequent UTIs.     Vaginosis is an inflammation of the vagina that can result in discharge, itching and pain. The cause is usually a change in the normal balance of vaginal bacteria or an infection. Vaginosis can also result from reduced estrogen levels after menopause.  The most common causes of vaginosis are:   Bacterial vaginosis which results from an overgrowth of one on several organisms that are normally present in your vagina.   Yeast infections which are caused by a naturally occurring fungus called candida.   Vaginal atrophy (atrophic vaginosis) which results from the thinning of the vagina from reduced estrogen levels after menopause.   Trichomoniasis which is caused by a parasite and is commonly transmitted by sexual intercourse.  Factors that increase your risk of developing vaginosis include: Marland Kitchen Medications, such as antibiotics and steroids . Uncontrolled diabetes . Use of hygiene products such as bubble bath, vaginal spray or vaginal deodorant . Douching . Wearing damp or tight-fitting clothing . Using an intrauterine device (IUD) for birth control . Hormonal changes, such as those associated with pregnancy, birth control pills or menopause . Sexual activity . Having a sexually transmitted infection  Your treatment plan is A single Diflucan (fluconazole) 150mg  tablet once.  I have electronically sent this prescription into the pharmacy that you have chosen.  Be sure to take all of the medication as directed. Stop taking any medication if you develop a rash, tongue swelling or shortness  of breath. Mothers who are breast feeding should consider pumping and discarding their breast milk while on these antibiotics. However, there is no consensus that infant exposure at these doses would be harmful.  Remember that medication creams can weaken latex condoms. Marland Kitchen   HOME CARE:  Good hygiene may prevent some types of vaginosis from recurring and may relieve some symptoms:  . Avoid baths, hot tubs and whirlpool spas. Rinse soap from your outer genital area after a shower, and dry the area well to prevent irritation. Don't use scented or harsh soaps, such as those with deodorant or antibacterial action. Marland Kitchen Avoid irritants. These include scented tampons and pads. . Wipe from front to back after using the toilet. Doing so avoids spreading fecal bacteria to your vagina.  Other things that may help prevent vaginosis include:  Marland Kitchen Don't douche. Your vagina doesn't require cleansing other than normal bathing. Repetitive douching disrupts the normal organisms that reside in the vagina and can actually increase your risk of vaginal infection. Douching won't clear up a vaginal infection. . Use a latex condom. Both female and female latex condoms may help you avoid infections spread by sexual contact. . Wear cotton underwear. Also wear pantyhose with a cotton crotch. If you feel comfortable without it, skip wearing underwear to bed. Yeast thrives in [employer] Your symptoms should improve in the next day or two.  GET HELP RIGHT AWAY IF:  . You have pain in your lower abdomen ( pelvic area or over your ovaries) . You develop nausea or vomiting . You develop a fever . Your discharge  changes or worsens . You have persistent pain with intercourse . You develop shortness of breath, a rapid pulse, or you faint.  These symptoms could be signs of problems or infections that need to be evaluated by a medical provider now.  MAKE SURE YOU    Understand these instructions.  Will watch your  condition.  Will get help right away if you are not doing well or get worse.  Your e-visit answers were reviewed by a board certified advanced clinical practitioner to complete your personal care plan. Depending upon the condition, your plan could have included both over the counter or prescription medications. Please review your pharmacy choice to make sure that you have choses a pharmacy that is open for you to pick up any needed prescription, Your safety is important to [place]. If you have drug allergies check your prescription carefully.   You can use MyChart to ask questions about today's visit, request a non-urgent call back, or ask for a work or school excuse for 24 hours related to this e-Visit. If it has been greater than 24 hours you will need to follow up with your provider, or enter a new e-Visit to address those concerns. You will get a MyChart message within the next two days asking about your experience. I hope that your e-visit has been valuable and will speed your recovery. I spent 7 min in completion of this chart- SO

## 2018-07-18 ENCOUNTER — Ambulatory Visit: Payer: BLUE CROSS/BLUE SHIELD | Admitting: Internal Medicine

## 2018-07-18 ENCOUNTER — Encounter: Payer: Self-pay | Admitting: Internal Medicine

## 2018-07-18 ENCOUNTER — Other Ambulatory Visit: Payer: Self-pay

## 2018-07-18 VITALS — BP 122/74 | HR 81 | Temp 98.0°F | Ht 64.2 in | Wt 363.0 lb

## 2018-07-18 DIAGNOSIS — M898X1 Other specified disorders of bone, shoulder: Secondary | ICD-10-CM

## 2018-07-18 DIAGNOSIS — Z6841 Body Mass Index (BMI) 40.0 and over, adult: Secondary | ICD-10-CM

## 2018-07-18 DIAGNOSIS — R0789 Other chest pain: Secondary | ICD-10-CM

## 2018-07-18 DIAGNOSIS — R635 Abnormal weight gain: Secondary | ICD-10-CM

## 2018-07-18 NOTE — Patient Instructions (Signed)
Preventing Unhealthy Weight Gain, Adult  Staying at a healthy weight is important to your overall health. When fat builds up in your body, you may become overweight or obese. Being overweight or obese increases your risk of developing certain health problems, such as heart disease, diabetes, sleeping problems, joint problems, and some types of cancer.  Unhealthy weight gain is often the result of making unhealthy food choices or not getting enough exercise. You can make changes to your lifestyle to prevent obesity and stay as healthy as possible.  What nutrition changes can be made?    Eat only as much as your body needs. To do this:  Pay attention to signs that you are hungry or full. Stop eating as soon as you feel full.  If you feel hungry, try drinking water first before eating. Drink enough water so your urine is clear or pale yellow.  Eat smaller portions. Pay attention to portion sizes when eating out.  Look at serving sizes on food labels. Most foods contain more than one serving per container.  Eat the recommended number of calories for your gender and activity level. For most active people, a daily total of 2,000 calories is appropriate. If you are trying to lose weight or are not very active, you may need to eat fewer calories. Talk with your health care provider or a diet and nutrition specialist (dietitian) about how many calories you need each day.  Choose healthy foods, such as:  Fruits and vegetables. At each meal, try to fill at least half of your plate with fruits and vegetables.  Whole grains, such as whole-wheat bread, brown rice, and quinoa.  Lean meats, such as chicken or fish.  Other healthy proteins, such as beans, eggs, or tofu.  Healthy fats, such as nuts, seeds, fatty fish, and olive oil.  Low-fat or fat-free dairy products.  Check food labels, and avoid food and drinks that:  Are high in calories.  Have added sugar.  Are high in sodium.  Have saturated fats or trans fats.  Cook foods  in healthier ways, such as by baking, broiling, or grilling.  Make a meal plan for the week, and shop with a grocery list to help you stay on track with your purchases. Try to avoid going to the grocery store when you are hungry.  When grocery shopping, try to shop around the outside of the store first, where the fresh foods are. Doing this helps you to avoid prepackaged foods, which can be high in sugar, salt (sodium), and fat.  What lifestyle changes can be made?    Exercise for 30 or more minutes on 5 or more days each week. Exercising may include brisk walking, yard work, biking, running, swimming, and team sports like basketball and soccer. Ask your health care provider which exercises are safe for you.  Do muscle-strengthening activities, such as lifting weights or using resistance bands, on 2 or more days a week.  Do not use any products that contain nicotine or tobacco, such as cigarettes and e-cigarettes. If you need help quitting, ask your health care provider.  Limit alcohol intake to no more than 1 drink a day for nonpregnant women and 2 drinks a day for men. One drink equals 12 oz of beer, 5 oz of wine, or 1 oz of hard liquor.  Try to get 7-9 hours of sleep each night.  What other changes can be made?  Keep a food and activity journal to keep track of:    What you ate and how many calories you had. Remember to count the calories in sauces, dressings, and side dishes.  Whether you were active, and what exercises you did.  Your calorie, weight, and activity goals.  Check your weight regularly. Track any changes. If you notice you have gained weight, make changes to your diet or activity routine.  Avoid taking weight-loss medicines or supplements. Talk to your health care provider before starting any new medicine or supplement.  Talk to your health care provider before trying any new diet or exercise plan.  Why are these changes important?  Eating healthy, staying active, and having healthy habits can help  you to prevent obesity. Those changes also:  Help you manage stress and emotions.  Help you connect with friends and family.  Improve your self-esteem.  Improve your sleep.  Prevent long-term health problems.  What can happen if changes are not made?  Being obese or overweight can cause you to develop joint or bone problems, which can make it hard for you to stay active or do activities you enjoy. Being obese or overweight also puts stress on your heart and lungs and can lead to health problems like diabetes, heart disease, and some cancers.  Where to find more information  Talk with your health care provider or a dietitian about healthy eating and healthy lifestyle choices. You may also find information from:  U.[place]. Department of Agriculture, MyPlate: www.choosemyplate.gov  American Heart Association: www.heart.org  Centers for Disease Control and Prevention: www.cdc.gov  Summary  Staying at a healthy weight is important to your overall health. It helps you to prevent certain diseases and health problems, such as heart disease, diabetes, joint problems, sleep disorders, and some types of cancer.  Being obese or overweight can cause you to develop joint or bone problems, which can make it hard for you to stay active or do activities you enjoy.  You can prevent unhealthy weight gain by eating a healthy diet, exercising regularly, not smoking, limiting alcohol, and getting enough sleep.  Talk with your health care provider or a dietitian for guidance about healthy eating and healthy lifestyle choices.  This information is not intended to replace advice given to you by your health care provider. Make sure you discuss any questions you have with your health care provider.  Document Released: 04/14/2016 Document Revised: 01/22/2017 Document Reviewed: 05/20/2016  Elsevier Interactive Patient Education  2019 [employer] Inc[employer]

## 2018-07-20 ENCOUNTER — Encounter: Payer: Self-pay | Admitting: Internal Medicine

## 2018-07-20 ENCOUNTER — Other Ambulatory Visit: Payer: Self-pay | Admitting: Internal Medicine

## 2018-07-20 MED ORDER — CYCLOBENZAPRINE HCL 10 MG PO TABS: 10.0000 mg | ORAL_TABLET | Freq: Three times a day (TID) | ORAL | 0 refills | Status: DC | PRN

## 2018-07-21 ENCOUNTER — Ambulatory Visit: Payer: BLUE CROSS/BLUE SHIELD | Admitting: Dietician

## 2018-07-25 NOTE — Progress Notes (Signed)
Subjective:     Patient ID: Crystal Escobar , female    DOB: 1970/07/21 , 48 y.o.   MRN: [id number]   Chief Complaint  Patient presents with  . Shoulder Pain    HPI  Shoulder Pain   The pain is present in the right shoulder. This is a new problem. The current episode started in the past 7 days. There has been no history of extremity trauma. The problem occurs 2 to 4 times per day. The problem has been unchanged. The quality of the pain is described as dull and aching. The pain is at a severity of 8/10. The pain is severe. Pertinent negatives include no fever or numbness. The symptoms are aggravated by lying down. She has tried NSAIDS for the symptoms. The treatment provided no relief. Her past medical history is significant for diabetes.   She does admit to belching more than usual. No h/o reflux.   Past Medical History:  Diagnosis Date  . Abnormal Pap smear 2011  . Diabetes mellitus   . DUB (dysfunctional uterine bleeding) 12/22/2012  . HLD (hyperlipidemia)   . Hypertension      Family History  Problem Relation Age of Onset  . Seizures Father   . Heart attack Mother   . Hypertension Mother   . Diabetes Mother   . Hypertension Sister   . Heart murmur Son   . Sickle cell anemia Maternal Aunt   . Hypertension Maternal Aunt      Current Outpatient Medications:  .  atorvastatin (LIPITOR) 20 MG tablet, Take 20 mg by mouth daily., Disp: , Rfl: 5 .  cetirizine (ZYRTEC) 10 MG tablet, Take 10 mg by mouth daily., Disp: , Rfl:  .  hydrochlorothiazide (MICROZIDE) 12.5 MG capsule, TAKE 1 CAPSULE BY MOUTH ONCE DAILY, Disp: 90 capsule, Rfl: 0 .  ibuprofen (ADVIL,MOTRIN) 600 MG tablet, TAKE ONE TABLET BY MOUTH EVERY 6 HOURS AS NEEDED FOR PAIN, Disp: 30 tablet, Rfl: 0 .  losartan (COZAAR) 50 MG tablet, TAKE 1 TABLET BY MOUTH ONCE DAILY, Disp: 90 tablet, Rfl: 0 .  medroxyPROGESTERone (PROVERA) 10 MG tablet, Take 10 mg by mouth daily. Take on 1 tablet daily on DAY 1-10 of each month, Disp: ,  Rfl:  .  meloxicam (MOBIC) 15 MG tablet, Take 1 tablet (15 mg total) by mouth daily., Disp: 14 tablet, Rfl: 0 .  OZEMPIC, 0.25 OR 0.5 MG/DOSE, 2 MG/1.5ML SOPN, INJECT 0.5 MG SUBCUTANEOUSLY ON THE SAME DAY EACH WEEK IN THE ABDOMEN THIGHS OR UPPER ARM ROTATING INJECTION SITES, Disp: , Rfl: 3 .  cyclobenzaprine (FLEXERIL) 10 MG tablet, Take 1 tablet (10 mg total) by mouth 3 (three) times daily as needed for muscle spasms., Disp: 30 tablet, Rfl: 0   No Known Allergies   Review of Systems  Constitutional: Positive for unexpected weight change. Negative for fever.  Respiratory: Negative.   Cardiovascular: Negative.   Gastrointestinal: Negative.   Musculoskeletal: Positive for arthralgias.  Neurological: Negative.  Negative for numbness.  Psychiatric/Behavioral: Negative.      Today's Vitals   07/18/18 1417  BP: 122/74  Pulse: 81  Temp: 98 F (36.7 C)  TempSrc: Oral  SpO2: 98%  Weight: (!) 363 lb (164.7 kg)  Height: 5' 4.2" (1.631 m)   Body mass index is 61.92 kg/m.   Objective:  Physical Exam Vitals signs and nursing note reviewed.  Constitutional:      Appearance: Normal appearance.  HENT:     Head: Normocephalic and atraumatic.  Cardiovascular:  Rate and Rhythm: Normal rate and regular rhythm.     Heart sounds: Normal heart sounds.  Pulmonary:     Effort: Pulmonary effort is normal.     Breath sounds: Normal breath sounds.  Skin:    General: Skin is warm.  Neurological:     General: No focal deficit present.     Mental Status: She is alert.  Psychiatric:        Mood and Affect: Mood normal.        Behavior: Behavior normal.         Assessment And Plan:     1. Scapulalgia  She may benefit from muscle relaxer. Will call in rx flexeril 10mg  nightly as needed. She will let me know if her sx persist. If no relief, she is advised her sx may be due to gas pains.   2. Other chest pain  Atypical in nature. However, due to her cardiac risk factors, I will check an  EKG. This did not have any acute changes.   - EKG 12-Lead  3. Weight gain  She is advised of six pound weight in the past month. Importance of regular exercise was discussed with the patient.   4. Morbid obesity with body mass index (BMI) of 60.0 to 69.9 in adult [facility])  Importance of achieving optimal weight to decrease risk of cardiovascular disease and cancers was discussed with the patient in full detail. She is encouraged to start slowly - start with 10 minutes twice daily at least three to four days per week and to gradually build to 30 minutes five days weekly. She was given tips to incorporate more activity into her daily routine - take stairs when possible, park farther away from her job, grocery stores, etc.  [name], MD

## 2018-07-28 ENCOUNTER — Ambulatory Visit: Payer: BLUE CROSS/BLUE SHIELD | Admitting: Dietician

## 2018-08-02 ENCOUNTER — Encounter: Payer: Self-pay | Admitting: Internal Medicine

## 2018-08-02 ENCOUNTER — Other Ambulatory Visit: Payer: Self-pay

## 2018-08-02 ENCOUNTER — Ambulatory Visit (INDEPENDENT_AMBULATORY_CARE_PROVIDER_SITE_OTHER): Payer: BLUE CROSS/BLUE SHIELD | Admitting: Internal Medicine

## 2018-08-02 VITALS — BP 126/82 | HR 100 | Temp 98.1°F | Ht 64.2 in | Wt 362.4 lb

## 2018-08-02 DIAGNOSIS — I10 Essential (primary) hypertension: Secondary | ICD-10-CM

## 2018-08-02 DIAGNOSIS — Z0000 Encounter for general adult medical examination without abnormal findings: Secondary | ICD-10-CM | POA: Diagnosis not present

## 2018-08-02 DIAGNOSIS — E1165 Type 2 diabetes mellitus with hyperglycemia: Secondary | ICD-10-CM

## 2018-08-02 LAB — POCT URINALYSIS DIPSTICK
Bilirubin, UA: NEGATIVE
Blood, UA: NEGATIVE
Glucose, UA: NEGATIVE
Ketones, UA: NEGATIVE
Leukocytes, UA: NEGATIVE
Nitrite, UA: NEGATIVE
Protein, UA: POSITIVE — AB
Spec Grav, UA: 1.025 (ref 1.010–1.025)
Urobilinogen, UA: 0.2 E.U./dL
pH, UA: 5.5 (ref 5.0–8.0)

## 2018-08-02 LAB — POCT UA - MICROALBUMIN
Creatinine, POC: 200 mg/dL
Microalbumin Ur, POC: 150 mg/L

## 2018-08-02 NOTE — Progress Notes (Signed)
Subjective:     Patient ID: Crystal Escobar , female    DOB: Nov 02, 1970 , 48 y.o.   MRN: [id number]   Chief Complaint  Patient presents with  . Annual Exam  . Diabetes  . Hypertension    HPI  She is here today for a full physical examination. She is followed by Dr. Vanessa Kick for her gyn exams. She was last seen Fall 2019.  Diabetes  She presents for her follow-up diabetic visit. She has type 2 diabetes mellitus. There are no hypoglycemic associated symptoms. Pertinent negatives for diabetes include no blurred vision and no chest pain. There are no hypoglycemic complications. Risk factors for coronary artery disease include diabetes mellitus, hypertension, obesity and sedentary lifestyle. She participates in exercise intermittently. An ACE inhibitor/angiotensin II receptor blocker is being taken.  Hypertension  This is a chronic problem. The current episode started more than 1 year ago. The problem has been gradually improving since onset. The problem is controlled. Pertinent negatives include no blurred vision, chest pain, palpitations or shortness of breath.   Reports compliance with meds.   Past Medical History:  Diagnosis Date  . Abnormal Pap smear 2011  . Diabetes mellitus   . DUB (dysfunctional uterine bleeding) 12/22/2012  . HLD (hyperlipidemia)   . Hypertension      Family History  Problem Relation Age of Onset  . Seizures Father   . Heart attack Mother   . Hypertension Mother   . Diabetes Mother   . Hypertension Sister   . Heart murmur Son   . Sickle cell anemia Maternal Aunt   . Hypertension Maternal Aunt      Current Outpatient Medications:  .  atorvastatin (LIPITOR) 20 MG tablet, Take 20 mg by mouth daily., Disp: , Rfl: 5 .  cetirizine (ZYRTEC) 10 MG tablet, Take 10 mg by mouth daily., Disp: , Rfl:  .  cyclobenzaprine (FLEXERIL) 10 MG tablet, Take 1 tablet (10 mg total) by mouth 3 (three) times daily as needed for muscle spasms., Disp: 30 tablet, Rfl: 0 .   hydrochlorothiazide (MICROZIDE) 12.5 MG capsule, TAKE 1 CAPSULE BY MOUTH ONCE DAILY, Disp: 90 capsule, Rfl: 0 .  losartan (COZAAR) 50 MG tablet, TAKE 1 TABLET BY MOUTH ONCE DAILY, Disp: 90 tablet, Rfl: 0 .  medroxyPROGESTERone (PROVERA) 10 MG tablet, Take 10 mg by mouth daily. Take on 1 tablet daily on DAY 1-10 of each month, Disp: , Rfl:  .  OZEMPIC, 0.25 OR 0.5 MG/DOSE, 2 MG/1.5ML SOPN, INJECT 0.5 MG SUBCUTANEOUSLY ON THE SAME DAY EACH WEEK IN THE ABDOMEN THIGHS OR UPPER ARM ROTATING INJECTION SITES, Disp: , Rfl: 3   No Known Allergies    The patient states she uses none for birth control. Last LMP was Patient's last menstrual period was 06/18/2018.. Negative for Dysmenorrhea Negative for: breast discharge, breast lump(s), breast pain and breast self exam. Associated symptoms include abnormal vaginal bleeding. Pertinent negatives include abnormal bleeding (hematology), anxiety, decreased libido, depression, difficulty falling sleep, dyspareunia, history of infertility, nocturia, sexual dysfunction, sleep disturbances, urinary incontinence, urinary urgency, vaginal discharge and vaginal itching. Diet regular.The patient states her exercise level is  intermittent.  . The patient's tobacco use is:  Social History   Tobacco Use  Smoking Status Former Smoker  . Packs/day: 1.00  . Years: 10.00  . Pack years: 10.00  . Types: Cigars  . Last attempt to quit: 06/03/2018  . Years since quitting: 0.1  Smokeless Tobacco Never Used  Tobacco Comment  1 cigar a day  . She has been exposed to passive smoke. The patient's alcohol use is:  Social History   Substance and Sexual Activity  Alcohol Use Yes   Comment: occasionally   . Additional information: Last pap Nov 2019, next one scheduled for Dec 2020.   Review of Systems  Constitutional: Negative.   HENT: Negative.   Eyes: Negative.  Negative for blurred vision.  Respiratory: Negative.  Negative for shortness of breath.   Cardiovascular:  Negative.  Negative for chest pain and palpitations.  Gastrointestinal: Negative.   Endocrine: Negative.   Genitourinary: Negative.   Musculoskeletal: Negative.   Skin: Negative.   Allergic/Immunologic: Negative.   Neurological: Negative.   Hematological: Negative.   Psychiatric/Behavioral: Negative.      Today's Vitals   08/02/18 0933  BP: 126/82  Pulse: 100  Temp: 98.1 F (36.7 C)  TempSrc: Oral  Weight: (!) 362 lb 6.4 oz (164.4 kg)  Height: 5' 4.2" (1.631 m)   Body mass index is 61.82 kg/m.   Objective:  Physical Exam Vitals signs and nursing note reviewed.  Constitutional:      Appearance: Normal appearance. She is obese.  HENT:     Head: Normocephalic and atraumatic.     Right Ear: Tympanic membrane, ear canal and external ear normal.     Left Ear: Tympanic membrane, ear canal and external ear normal.     Nose: Nose normal.     Mouth/Throat:     Mouth: Mucous membranes are moist.     Pharynx: Oropharynx is clear.  Eyes:     Extraocular Movements: Extraocular movements intact.     Conjunctiva/sclera: Conjunctivae normal.     Pupils: Pupils are equal, round, and reactive to light.  Neck:     Musculoskeletal: Normal range of motion and neck supple.  Cardiovascular:     Rate and Rhythm: Normal rate and regular rhythm.     Pulses: Normal pulses.          Dorsalis pedis pulses are 2+ on the right side and 2+ on the left side.       Posterior tibial pulses are 2+ on the right side and 2+ on the left side.     Heart sounds: Normal heart sounds.  Pulmonary:     Effort: Pulmonary effort is normal.     Breath sounds: Normal breath sounds.  Chest:     Breasts:        Right: Normal. No swelling, bleeding, inverted nipple, mass or nipple discharge.        Left: Normal. No swelling, bleeding, inverted nipple, mass or nipple discharge.  Abdominal:     General: Bowel sounds are normal.     Palpations: Abdomen is soft.     Comments: Obese. Difficult to assess  organomegaly  Genitourinary:    Comments: deferred Musculoskeletal: Normal range of motion.  Feet:     Right foot:     Protective Sensation: 5 sites tested. 5 sites sensed.     Skin integrity: Skin integrity normal.     Left foot:     Protective Sensation: 5 sites tested. 5 sites sensed.     Skin integrity: Skin integrity normal.  Skin:    General: Skin is warm and dry.  Neurological:     General: No focal deficit present.     Mental Status: She is alert and oriented to person, place, and time.  Psychiatric:        Mood and Affect: Mood  normal.        Behavior: Behavior normal.         Assessment And Plan:     1. Routine general medical examination at health care facility   A full exam was performed. Importance of monthly self breast exams was discussed with the patient. PATIENT HAS BEEN ADVISED TO GET 30-45 MINUTES REGULAR EXERCISE NO LESS THAN FOUR TO FIVE DAYS PER WEEK - BOTH WEIGHTBEARING EXERCISES AND AEROBIC ARE RECOMMENDED.  SHE IS ADVISED TO FOLLOW A HEALTHY DIET WITH AT LEAST SIX FRUITS/VEGGIES PER DAY, DECREASE INTAKE OF RED MEAT, AND TO INCREASE FISH INTAKE TO TWO DAYS PER WEEK.  MEATS/FISH SHOULD NOT BE FRIED, BAKED OR BROILED IS PREFERABLE.  I SUGGEST WEARING SPF 50 SUNSCREEN ON EXPOSED PARTS AND ESPECIALLY WHEN IN THE DIRECT SUNLIGHT FOR AN EXTENDED PERIOD OF TIME.  PLEASE AVOID FAST FOOD RESTAURANTS AND INCREASE YOUR WATER INTAKE.  - BMP8+EGFR - Hemoglobin A1c - Lipid panel - CBC no Diff - TSH  2. Uncontrolled type 2 diabetes mellitus with hyperglycemia (HCC)  Diabetic foot exam was performed.  I DISCUSSED WITH THE PATIENT AT LENGTH REGARDING THE GOALS OF GLYCEMIC CONTROL AND POSSIBLE LONG-TERM COMPLICATIONS.  I  ALSO STRESSED THE IMPORTANCE OF COMPLIANCE WITH HOME GLUCOSE MONITORING, DIETARY RESTRICTIONS INCLUDING AVOIDANCE OF SUGARY DRINKS/PROCESSED FOODS,  ALONG WITH REGULAR EXERCISE.  I  ALSO STRESSED THE IMPORTANCE OF ANNUAL EYE EXAMS, SELF FOOT CARE AND  COMPLIANCE WITH OFFICE VISITS.   3. Essential hypertension, benign  Well controlled. She will continue with current meds. She is encouraged to avoid adding salt to her foods. EKG performed, NSR w/o acute changes.   - EKG 12-Lead        [name], MD    THE PATIENT IS ENCOURAGED TO PRACTICE SOCIAL DISTANCING DUE TO THE COVID-19 PANDEMIC.

## 2018-08-02 NOTE — Patient Instructions (Signed)

## 2018-08-03 LAB — BMP8+EGFR
BUN/Creatinine Ratio: 13 (ref 9–23)
BUN: 9 mg/dL (ref 6–24)
CO2: 22 mmol/L (ref 20–29)
Calcium: 9.7 mg/dL (ref 8.7–10.2)
Chloride: 96 mmol/L (ref 96–106)
Creatinine, Ser: 0.67 mg/dL (ref 0.57–1.00)
GFR calc Af Amer: 123 mL/min/{1.73_m2} (ref >59)
GFR calc non Af Amer: 106 mL/min/{1.73_m2} (ref >59)
Glucose: 188 mg/dL — ABNORMAL HIGH (ref 65–99)
Potassium: 4.5 mmol/L (ref 3.5–5.2)
Sodium: 132 mmol/L — ABNORMAL LOW (ref 134–144)

## 2018-08-03 LAB — LIPID PANEL
Chol/HDL Ratio: 4.4 ratio (ref 0.0–4.4)
Cholesterol, Total: 208 mg/dL — ABNORMAL HIGH (ref 100–199)
HDL: 47 mg/dL (ref >39)
LDL Calculated: 137 mg/dL — ABNORMAL HIGH (ref 0–99)
Triglycerides: 119 mg/dL (ref 0–149)
VLDL Cholesterol Cal: 24 mg/dL (ref 5–40)

## 2018-08-03 LAB — CBC
Hematocrit: 38.5 % (ref 34.0–46.6)
Hemoglobin: 12.5 g/dL (ref 11.1–15.9)
MCH: 26.2 pg — ABNORMAL LOW (ref 26.6–33.0)
MCHC: 32.5 g/dL (ref 31.5–35.7)
MCV: 81 fL (ref 79–97)
Platelets: 328 10*3/uL (ref 150–450)
RBC: 4.77 x10E6/uL (ref 3.77–5.28)
RDW: 15.5 % — ABNORMAL HIGH (ref 11.7–15.4)
WBC: 7.6 10*3/uL (ref 3.4–10.8)

## 2018-08-03 LAB — TSH: TSH: 2.24 u[IU]/mL (ref 0.450–4.500)

## 2018-08-03 LAB — HEMOGLOBIN A1C
Est. average glucose Bld gHb Est-mCnc: 209 mg/dL
Hgb A1c MFr Bld: 8.9 % — ABNORMAL HIGH (ref 4.8–5.6)

## 2018-08-08 ENCOUNTER — Encounter: Payer: Self-pay | Admitting: Internal Medicine

## 2018-08-09 ENCOUNTER — Ambulatory Visit (INDEPENDENT_AMBULATORY_CARE_PROVIDER_SITE_OTHER): Payer: BLUE CROSS/BLUE SHIELD | Admitting: Family Medicine

## 2018-08-09 ENCOUNTER — Other Ambulatory Visit: Payer: Self-pay

## 2018-08-09 ENCOUNTER — Encounter (INDEPENDENT_AMBULATORY_CARE_PROVIDER_SITE_OTHER): Payer: Self-pay | Admitting: Family Medicine

## 2018-08-09 ENCOUNTER — Ambulatory Visit (INDEPENDENT_AMBULATORY_CARE_PROVIDER_SITE_OTHER): Payer: Self-pay

## 2018-08-09 DIAGNOSIS — M25561 Pain in right knee: Secondary | ICD-10-CM | POA: Diagnosis not present

## 2018-08-09 MED ORDER — NABUMETONE 750 MG PO TABS: 750.0000 mg | ORAL_TABLET | Freq: Two times a day (BID) | ORAL | 6 refills | Status: DC | PRN

## 2018-08-09 MED ORDER — DICLOFENAC SODIUM 1 % TD GEL: 4.0000 g | Freq: Four times a day (QID) | TRANSDERMAL | 6 refills | Status: DC | PRN

## 2018-08-09 NOTE — Patient Instructions (Addendum)
   For arthritis:  - Glucosamine Sulfate 1,000 mg twice daily  - Turmeric 500 mg twice daily  - Avoid sugar/sweets.   Good for bone health:  - Vitamin D3:  5,000 IU daily - Vitamin K2:  100 mcg daily - Magnesium:  400 mg daily

## 2018-08-09 NOTE — Progress Notes (Signed)
Office Visit Note   Patient: Crystal Escobar           Date of Birth: 1970-07-14           MRN: [id number] Visit Date: 08/09/2018 Requested by: Glendale Chard, [address] [place], [place] [address] PCP: Glendale Chard, MD  Subjective: Chief Complaint  Patient presents with  . Right Knee - Pain    Pain x 2 weeks, NKI. Pops. No swelling. Pain anterior knee. Yesterday, the pain was worse - "could hardly move it."    HPI: She's here with right knee pain for the past couple weeks.  No injury, but pain on anterolateral knee when planting and pivoting.  No locking or swelling noted.  Ibuprofen helps a little.  Denies locking.  Has had previous left knee scope, but no prior problems with right.  Denies hip pain, denies fevers/chills.              ROS: No respiratory symptoms.  All other systems were reviewed and are negative.  Objective: Vital Signs: There were no vitals taken for this visit.  Physical Exam:  General:  Alert and oriented, in no acute distress. Pulm:  Breathing unlabored. Psy:  Normal mood, congruent affect. Skin:  No rash.  Right knee:  No pain with passive internal hip rotation.  Has full active extension and flexion of 110 degrees.  Trace effusion, no warmth.  Ligaments feel stable.  Tender anterolateral joint line but no click with McMurray's.  No tenderness quadriceps or patella tendons.  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   Assessment & Plan: 1.  Right knee pain due to DJD.  Has loose body, but no locking symptoms. - Discussed with her and will try Rx oral and topical NSAID prn; glucosamine sulfate and turmeric OTC. - Could contemplate steroid or gel injection in the future if pain worsens.     Procedures: No procedures performed  No notes on file     PMFS History: Patient Active Problem List   Diagnosis Date Noted  . Morbid  obesity with body mass index (BMI) of 60.0 to 69.9 in adult ([place]) 06/30/2018  . Obesity with alveolar hypoventilation and body mass index (BMI) of 40 or greater ([place]) 06/30/2018  . Carpal tunnel syndrome, left 05/26/2017  . Morton's neuroma of left foot 05/26/2017  . DUB (dysfunctional uterine bleeding) 12/22/2012  . SLEEP APNEA 07/15/2006  . HYPERLIPIDEMIA 06/07/2006  . DYSMETABOLIC SYNDROME 81/04/7508  . LIVER FUNCTION TESTS, ABNORMAL 06/07/2006  . DIABETES MELLITUS, TYPE II, CONTROLLED 05/24/2006  . Obesity 05/24/2006  . TOBACCO ABUSE 05/24/2006  . HYPERTENSION, BENIGN ESSENTIAL 05/24/2006  . OSTEOARTHROSIS NOS, UNSPECIFIED SITE 05/24/2006  . ARTHROSCOPY, LEFT KNEE, HX OF 05/24/2006   Past Medical History:  Diagnosis Date  . Abnormal Pap smear 2011  . Diabetes mellitus   . DUB (dysfunctional uterine bleeding) 12/22/2012  . HLD (hyperlipidemia)   . Hypertension     Family History  Problem Relation Age of Onset  . Seizures Father   . Heart attack Mother   . Hypertension Mother   . Diabetes Mother   . Hypertension Sister   . Heart murmur Son   . Sickle cell anemia Maternal Aunt   . Hypertension Maternal Aunt     Past Surgical History:  Procedure Laterality Date  . CARPAL TUNNEL RELEASE Right 2016  . CESAREAN SECTION    .  KNEE SURGERY    . TUBAL LIGATION     Social History   Occupational History  . Not on file  Tobacco Use  . Smoking status: Former Smoker    Packs/day: 1.00    Years: 10.00    Pack years: 10.00    Types: Cigars    Last attempt to quit: 06/03/2018    Years since quitting: 0.1  . Smokeless tobacco: Never Used  . Tobacco comment: 1 cigar a day  Substance and Sexual Activity  . Alcohol use: Yes    Comment: occasionally   . Drug use: No  . Sexual activity: Yes    Partners: Male    Birth control/protection: Surgical    Comment: BTL

## 2018-08-11 ENCOUNTER — Encounter: Payer: Self-pay | Admitting: Podiatry

## 2018-08-11 ENCOUNTER — Ambulatory Visit: Payer: BLUE CROSS/BLUE SHIELD | Admitting: Podiatry

## 2018-08-15 ENCOUNTER — Encounter: Payer: Self-pay | Admitting: Neurology

## 2018-08-15 ENCOUNTER — Telehealth: Payer: Self-pay

## 2018-08-15 ENCOUNTER — Other Ambulatory Visit: Payer: Self-pay | Admitting: Neurology

## 2018-08-15 DIAGNOSIS — R51 Headache: Principal | ICD-10-CM

## 2018-08-15 DIAGNOSIS — R519 Headache, unspecified: Secondary | ICD-10-CM

## 2018-08-15 DIAGNOSIS — Z6841 Body Mass Index (BMI) 40.0 and over, adult: Secondary | ICD-10-CM

## 2018-08-15 NOTE — Telephone Encounter (Signed)
Pt was scheduled on 08/10/2018 for CPAP titration study. Due to Covid-19, sleep lab is still closed at this time. Would MD like to order AutoPAP for this patient?

## 2018-08-15 NOTE — Telephone Encounter (Signed)
Order placed for the patient and sent a mychart message. Will wait to hear back.

## 2018-08-19 ENCOUNTER — Encounter: Payer: Self-pay | Admitting: Podiatry

## 2018-08-21 ENCOUNTER — Other Ambulatory Visit: Payer: Self-pay | Admitting: Internal Medicine

## 2018-08-21 ENCOUNTER — Other Ambulatory Visit: Payer: Self-pay | Admitting: Podiatry

## 2018-09-15 ENCOUNTER — Encounter: Payer: BLUE CROSS/BLUE SHIELD | Admitting: Internal Medicine

## 2018-10-05 ENCOUNTER — Other Ambulatory Visit: Payer: Self-pay

## 2018-10-05 ENCOUNTER — Other Ambulatory Visit: Payer: BLUE CROSS/BLUE SHIELD

## 2018-10-05 DIAGNOSIS — Z20822 Contact with and (suspected) exposure to covid-19: Secondary | ICD-10-CM

## 2018-10-10 ENCOUNTER — Other Ambulatory Visit: Payer: Self-pay | Admitting: Internal Medicine

## 2018-10-10 ENCOUNTER — Encounter (INDEPENDENT_AMBULATORY_CARE_PROVIDER_SITE_OTHER): Payer: Self-pay

## 2018-10-11 ENCOUNTER — Encounter (INDEPENDENT_AMBULATORY_CARE_PROVIDER_SITE_OTHER): Payer: Self-pay

## 2018-10-11 LAB — NOVEL CORONAVIRUS, NAA: SARS-CoV-2, NAA: NOT DETECTED

## 2018-10-12 ENCOUNTER — Encounter (INDEPENDENT_AMBULATORY_CARE_PROVIDER_SITE_OTHER): Payer: Self-pay

## 2018-10-17 ENCOUNTER — Other Ambulatory Visit: Payer: Self-pay

## 2018-10-17 ENCOUNTER — Ambulatory Visit: Payer: BC Managed Care – PPO | Admitting: Podiatry

## 2018-10-17 ENCOUNTER — Encounter: Payer: Self-pay | Admitting: Podiatry

## 2018-10-17 VITALS — Temp 97.2°F

## 2018-10-17 DIAGNOSIS — M722 Plantar fascial fibromatosis: Secondary | ICD-10-CM

## 2018-10-17 DIAGNOSIS — G5762 Lesion of plantar nerve, left lower limb: Secondary | ICD-10-CM

## 2018-10-17 DIAGNOSIS — M2141 Flat foot [pes planus] (acquired), right foot: Secondary | ICD-10-CM | POA: Diagnosis not present

## 2018-10-17 DIAGNOSIS — D3610 Benign neoplasm of peripheral nerves and autonomic nervous system, unspecified: Secondary | ICD-10-CM

## 2018-10-17 DIAGNOSIS — M2142 Flat foot [pes planus] (acquired), left foot: Secondary | ICD-10-CM

## 2018-10-17 MED ORDER — TRIAMCINOLONE ACETONIDE 10 MG/ML IJ SUSP
10.0000 mg | Freq: Once | INTRAMUSCULAR | Status: AC
  Administered 2018-10-17: 10 mg

## 2018-10-18 NOTE — Progress Notes (Signed)
Subjective: 48 year old female presents the office today for follow-up evaluation of left foot pain.  She states that she feels like there is something "balled up" the ball of her foot she is also so the numbness into the third fourth but also the second toe.  She also states that she has not gone back on the gabapentin as it was not helpful.  She is also started getting some of the arch of the foot as well.  No recent injury or trauma any changes otherwise. Denies any systemic complaints such as fevers, chills, nausea, vomiting. No acute changes since last appointment, and no other complaints at this time.   Objective: AAO x3, NAD DP/PT pulses palpable bilaterally, CRT less than 3 seconds Majority of tenderness is localized to the third interspace of the left foot and mild to the second interspace.  She still reporting numbness and burning into the digits mostly to her third and fourth toe but also her second toe.  There is no area pinpoint tenderness.  She started developed some discomfort on the arch of the foot as well.  She is on the medial lateral plantar fascial tear is no area pinpoint tenderness. No open lesions or pre-ulcerative lesions.  No pain with calf compression, swelling, warmth, erythema  Assessment: Left foot likely neuroma; plantar fasciitis  Plan: -All treatment options discussed with the patient including all alternatives, risks, complications.  -A steroid injection was performed to the third interspace.  See procedure note below.  Continue offloading pads.  Discussed orthotics.  We will check orthotic coverage for her.  Discussed stretching exercises for plantar fasciitis. -Patient encouraged to call the office with any questions, concerns, change in symptoms.   Procedure: Injection neuroma Discussed alternatives, risks, complications and verbal consent was obtained.  Location: Left 3rd interspace Skin Prep: Alcohol Injectate: 0.5cc 0.5% marcaine plain, 0.5 cc 2%  lidocaine plain and, 1 cc kenalog 10. Disposition: Patient tolerated procedure well. Injection site dressed with a band-aid.  Post-injection care was discussed and return precautions discussed.    Trula Slade DPM

## 2018-10-24 ENCOUNTER — Ambulatory Visit: Payer: BLUE CROSS/BLUE SHIELD | Admitting: Internal Medicine

## 2018-10-25 ENCOUNTER — Other Ambulatory Visit: Payer: Self-pay

## 2018-10-25 ENCOUNTER — Encounter: Payer: Self-pay | Admitting: Internal Medicine

## 2018-10-25 ENCOUNTER — Ambulatory Visit (INDEPENDENT_AMBULATORY_CARE_PROVIDER_SITE_OTHER): Payer: BC Managed Care – PPO | Admitting: Internal Medicine

## 2018-10-25 VITALS — BP 142/90 | HR 80 | Temp 98.5°F | Ht 64.2 in | Wt 363.2 lb

## 2018-10-25 DIAGNOSIS — Z6841 Body Mass Index (BMI) 40.0 and over, adult: Secondary | ICD-10-CM

## 2018-10-25 DIAGNOSIS — I10 Essential (primary) hypertension: Secondary | ICD-10-CM | POA: Diagnosis not present

## 2018-10-25 DIAGNOSIS — E1165 Type 2 diabetes mellitus with hyperglycemia: Secondary | ICD-10-CM | POA: Diagnosis not present

## 2018-10-25 DIAGNOSIS — Z23 Encounter for immunization: Secondary | ICD-10-CM

## 2018-10-25 MED ORDER — TETANUS-DIPHTH-ACELL PERTUSSIS 5-2.5-18.5 LF-MCG/0.5 IM SUSP
0.5000 mL | Freq: Once | INTRAMUSCULAR | Status: AC
  Administered 2018-10-25: 12:00:00 0.5 mL via INTRAMUSCULAR

## 2018-10-25 NOTE — Patient Instructions (Signed)

## 2018-10-26 LAB — HEMOGLOBIN A1C
Est. average glucose Bld gHb Est-mCnc: 249 mg/dL
Hgb A1c MFr Bld: 10.3 % — ABNORMAL HIGH (ref 4.8–5.6)

## 2018-10-27 ENCOUNTER — Encounter: Payer: Self-pay | Admitting: Internal Medicine

## 2018-10-30 NOTE — Progress Notes (Signed)
Subjective:     Patient ID: Crystal Escobar , female    DOB: [birth year] , 48 y.o.   MRN: [id number]   Chief Complaint  Patient presents with  . Diabetes  . Hypertension    HPI  Diabetes She presents for her follow-up diabetic visit. She has type 2 diabetes mellitus. There are no hypoglycemic associated symptoms. There are no diabetic associated symptoms. Pertinent negatives for diabetes include no blurred vision and no chest pain. There are no hypoglycemic complications. Risk factors for coronary artery disease include diabetes mellitus, dyslipidemia, hypertension, obesity and sedentary lifestyle. When asked about current treatments, none were reported. She is compliant with treatment some of the time. She is following a generally healthy diet. She participates in exercise intermittently. An ACE inhibitor/angiotensin II receptor blocker is being taken.  Hypertension This is a chronic problem. The current episode started more than 1 year ago. The problem has been gradually improving since onset. The problem is uncontrolled. Pertinent negatives include no blurred vision, chest pain, palpitations or shortness of breath.     Past Medical History:  Diagnosis Date  . Abnormal Pap smear 2011  . Diabetes mellitus   . DUB (dysfunctional uterine bleeding) 12/22/2012  . HLD (hyperlipidemia)   . Hypertension      Family History  Problem Relation Age of Onset  . Seizures Father   . Heart attack Mother   . Hypertension Mother   . Diabetes Mother   . Hypertension Sister   . Heart murmur Son   . Sickle cell anemia Maternal Aunt   . Hypertension Maternal Aunt      Current Outpatient Medications:  .  atorvastatin (LIPITOR) 20 MG tablet, Take 20 mg by mouth daily., Disp: , Rfl: 5 .  cetirizine (ZYRTEC) 10 MG tablet, Take 10 mg by mouth daily., Disp: , Rfl:  .  diclofenac sodium (VOLTAREN) 1 % GEL, Apply 4 g topically 4 (four) times daily as needed., Disp: 500 g, Rfl: 6 .  hydrochlorothiazide  (MICROZIDE) 12.5 MG capsule, Take 1 capsule by mouth once daily, Disp: 90 capsule, Rfl: 0 .  losartan (COZAAR) 50 MG tablet, Take 1 tablet by mouth once daily, Disp: 90 tablet, Rfl: 0 .  medroxyPROGESTERone (PROVERA) 10 MG tablet, Take 10 mg by mouth daily. Take on 1 tablet daily on DAY 1-10 of each month, Disp: , Rfl:  .  meloxicam (MOBIC) 15 MG tablet, Take 1 tablet by mouth once daily, Disp: 14 tablet, Rfl: 0 .  nabumetone (RELAFEN) 750 MG tablet, Take 1 tablet (750 mg total) by mouth 2 (two) times daily as needed., Disp: 60 tablet, Rfl: 6 .  OZEMPIC, 0.25 OR 0.5 MG/DOSE, 2 MG/1.5ML SOPN, INJECT 0.5 MG SUBCUTANEOUSLY ON THE SAME DAY EACH WEEK IN THE ABDOMEN, THIGHS, OR UPPER ARM,ROTATING INJECTION SITES, Disp: 2 mL, Rfl: 0 .  cyclobenzaprine (FLEXERIL) 10 MG tablet, Take 1 tablet (10 mg total) by mouth 3 (three) times daily as needed for muscle spasms. (Patient not taking: Reported on 10/25/2018), Disp: 30 tablet, Rfl: 0   No Known Allergies   Review of Systems  Constitutional: Negative.   Eyes: Negative for blurred vision.  Respiratory: Negative.  Negative for shortness of breath.   Cardiovascular: Negative.  Negative for chest pain and palpitations.  Gastrointestinal: Negative.   Neurological: Negative.   Psychiatric/Behavioral: Negative.      Today's Vitals   10/25/18 1140  BP: (!) 142/90  Pulse: 80  Temp: 98.5 F (36.9 C)  TempSrc: Oral  Weight: (!) 363 lb 3.2 oz (164.7 kg)  Height: 5' 4.2" (1.631 m)   Body mass index is 61.96 kg/m.   Objective:  Physical Exam Vitals signs and nursing note reviewed.  Constitutional:      Appearance: Normal appearance. She is obese.  HENT:     Head: Normocephalic and atraumatic.  Cardiovascular:     Rate and Rhythm: Normal rate and regular rhythm.     Heart sounds: Normal heart sounds.  Pulmonary:     Effort: Pulmonary effort is normal.     Breath sounds: Normal breath sounds.  Skin:    General: Skin is warm.  Neurological:      General: No focal deficit present.     Mental Status: She is alert.  Psychiatric:        Mood and Affect: Mood normal.        Behavior: Behavior normal.         Assessment And Plan:     1. Uncontrolled type 2 diabetes mellitus with hyperglycemia ([place])  I will check an a1c. Unfortunately, she has not been compliant with once weekly dosing of GLP1 agonists. I will consider use of Rybelsus, an oral GLP-1 agent that requires daily dosing.   - Hemoglobin A1c  2. Essential hypertension, benign  Fair control. If not improved at next visit, I will increase her dose to losartan 100mg  daily. She is encouraged to avoid adding salt to her foods and to increase her daily activity.   3. Morbid obesity with body mass index (BMI) of 60.0 to 69.9 in adult [facility])  Chronic. She agrees to consider bariatric surgery. I will refer her to [place] program as requested.  - Amb Referral to Bariatric Surgery  4. Need for vaccination  - Tdap (BOOSTRIX) injection 0.5 mL    [name], MD    THE PATIENT IS ENCOURAGED TO PRACTICE SOCIAL DISTANCING DUE TO THE COVID-19 PANDEMIC.

## 2018-10-31 ENCOUNTER — Encounter: Payer: Self-pay | Admitting: Internal Medicine

## 2018-10-31 ENCOUNTER — Other Ambulatory Visit: Payer: Self-pay | Admitting: Internal Medicine

## 2018-10-31 MED ORDER — FLUCONAZOLE 150 MG PO TABS: ORAL_TABLET | ORAL | 0 refills | Status: DC

## 2018-11-07 ENCOUNTER — Other Ambulatory Visit: Payer: Self-pay

## 2018-11-07 ENCOUNTER — Ambulatory Visit: Payer: BC Managed Care – PPO | Admitting: Podiatry

## 2018-11-07 DIAGNOSIS — E119 Type 2 diabetes mellitus without complications: Secondary | ICD-10-CM

## 2018-11-07 DIAGNOSIS — M722 Plantar fascial fibromatosis: Secondary | ICD-10-CM

## 2018-11-07 DIAGNOSIS — D3610 Benign neoplasm of peripheral nerves and autonomic nervous system, unspecified: Secondary | ICD-10-CM | POA: Diagnosis not present

## 2018-11-07 DIAGNOSIS — M2142 Flat foot [pes planus] (acquired), left foot: Secondary | ICD-10-CM

## 2018-11-07 DIAGNOSIS — M2141 Flat foot [pes planus] (acquired), right foot: Secondary | ICD-10-CM

## 2018-11-07 MED ORDER — DICLOFENAC SODIUM 75 MG PO TBEC: 75.0000 mg | DELAYED_RELEASE_TABLET | Freq: Two times a day (BID) | ORAL | 0 refills | Status: DC

## 2018-11-08 ENCOUNTER — Encounter: Payer: Self-pay | Admitting: Internal Medicine

## 2018-11-14 NOTE — Progress Notes (Signed)
Subjective: 48 year old female presents the office today for follow-up evaluation of left foot pain, likely neuroma.  She said the injection did help some.  She points more to the second interspace where she gets the majority of tenderness now. Denies any systemic complaints such as fevers, chills, nausea, vomiting. No acute changes since last appointment, and no other complaints at this time.   Objective: AAO x3, NAD DP/PT pulses palpable bilaterally, CRT less than 3 seconds Minimal discomfort of the third interspace.  Majority of tenderness is along second interspace today.  Mild discomfort submetatarsal 2.  There is no edema, erythema.  There is no area pinpoint tenderness.  Ankle, subtalar joint range of motion intact. No open lesions or pre-ulcerative lesions.  No pain with calf compression, swelling, warmth, erythema  Assessment: 48 year old female capsulitis, neuroma  Plan: -All treatment options discussed with the patient including all alternatives, risks, complications.  -Steroid injection performed to the second interspace today.  See procedure note below.  Continue.  We discussed again shoe modifications/orthtoics.  -Patient encouraged to call the office with any questions, concerns, change in symptoms.   Procedure: Injection neuroma Discussed alternatives, risks, complications and verbal consent was obtained.  Location: Right second interspace Skin Prep: Alcohol Injectate: 0.5cc 0.5% marcaine plain, 0.5 cc 2% lidocaine plain and, 1 cc kenalog 10. Disposition: Patient tolerated procedure well. Injection site dressed with a band-aid.  Post-injection care was discussed and return precautions discussed.   Trula Slade DPM

## 2018-11-17 ENCOUNTER — Encounter: Payer: Self-pay | Admitting: Internal Medicine

## 2018-11-21 ENCOUNTER — Encounter: Payer: Self-pay | Admitting: Neurology

## 2018-11-21 ENCOUNTER — Ambulatory Visit: Payer: BC Managed Care – PPO | Admitting: Internal Medicine

## 2018-11-22 ENCOUNTER — Other Ambulatory Visit: Payer: Self-pay | Admitting: Internal Medicine

## 2018-11-28 ENCOUNTER — Other Ambulatory Visit: Payer: Self-pay

## 2018-11-28 ENCOUNTER — Encounter: Payer: Self-pay | Admitting: Internal Medicine

## 2018-11-28 MED ORDER — RYBELSUS 3 MG PO TABS: 1.0000 | ORAL_TABLET | Freq: Every day | ORAL | 2 refills | Status: DC

## 2018-12-05 ENCOUNTER — Ambulatory Visit: Payer: BC Managed Care – PPO | Admitting: Orthotics

## 2018-12-05 ENCOUNTER — Other Ambulatory Visit: Payer: Self-pay

## 2018-12-05 DIAGNOSIS — M722 Plantar fascial fibromatosis: Secondary | ICD-10-CM

## 2018-12-05 DIAGNOSIS — M2141 Flat foot [pes planus] (acquired), right foot: Secondary | ICD-10-CM

## 2018-12-05 DIAGNOSIS — D3610 Benign neoplasm of peripheral nerves and autonomic nervous system, unspecified: Secondary | ICD-10-CM

## 2018-12-05 NOTE — Progress Notes (Signed)
Patient came in today to pick up custom made foot orthotics.  The goals were accomplished and the patient reported no dissatisfaction with said orthotics.  Patient was advised of breakin period and how to report any issues. 

## 2018-12-06 ENCOUNTER — Other Ambulatory Visit: Payer: BC Managed Care – PPO | Admitting: Orthotics

## 2018-12-13 ENCOUNTER — Encounter: Payer: Self-pay | Admitting: Internal Medicine

## 2018-12-15 ENCOUNTER — Encounter: Payer: Self-pay | Admitting: Neurology

## 2018-12-15 ENCOUNTER — Encounter: Payer: Self-pay | Admitting: Internal Medicine

## 2019-01-02 ENCOUNTER — Encounter: Payer: Self-pay | Admitting: Internal Medicine

## 2019-01-08 ENCOUNTER — Encounter: Payer: Self-pay | Admitting: Internal Medicine

## 2019-01-10 ENCOUNTER — Encounter: Payer: Self-pay | Admitting: Internal Medicine

## 2019-01-25 ENCOUNTER — Encounter: Payer: Self-pay | Admitting: Internal Medicine

## 2019-03-02 ENCOUNTER — Ambulatory Visit: Payer: BC Managed Care – PPO | Admitting: Internal Medicine

## 2019-03-02 ENCOUNTER — Encounter: Payer: Self-pay | Admitting: Internal Medicine

## 2019-03-02 ENCOUNTER — Other Ambulatory Visit: Payer: Self-pay

## 2019-03-02 VITALS — BP 124/78 | HR 65 | Temp 98.2°F | Ht 62.4 in | Wt 351.1 lb

## 2019-03-02 DIAGNOSIS — I10 Essential (primary) hypertension: Secondary | ICD-10-CM | POA: Diagnosis not present

## 2019-03-02 DIAGNOSIS — Z6841 Body Mass Index (BMI) 40.0 and over, adult: Secondary | ICD-10-CM

## 2019-03-02 DIAGNOSIS — E1165 Type 2 diabetes mellitus with hyperglycemia: Secondary | ICD-10-CM

## 2019-03-02 NOTE — Patient Instructions (Signed)
Diabetes Mellitus and Exercise Exercising regularly is important for your overall health, especially when you have diabetes (diabetes mellitus). Exercising is not only about losing weight. It has many other health benefits, such as increasing muscle strength and bone density and reducing body fat and stress. This leads to improved fitness, flexibility, and endurance, all of which result in better overall health. Exercise has additional benefits for people with diabetes, including:  Reducing appetite.  Helping to lower and control blood glucose.  Lowering blood pressure.  Helping to control amounts of fatty substances (lipids) in the blood, such as cholesterol and triglycerides.  Helping the body to respond better to insulin (improving insulin sensitivity).  Reducing how much insulin the body needs.  Decreasing the risk for heart disease by: ? Lowering cholesterol and triglyceride levels. ? Increasing the levels of good cholesterol. ? Lowering blood glucose levels. What is my activity plan? Your health care provider or certified diabetes educator can help you make a plan for the type and frequency of exercise (activity plan) that works for you. Make sure that you:  Do at least 150 minutes of moderate-intensity or vigorous-intensity exercise each week. This could be brisk walking, biking, or water aerobics. ? Do stretching and strength exercises, such as yoga or weightlifting, at least 2 times a week. ? Spread out your activity over at least 3 days of the week.  Get some form of physical activity every day. ? Do not go more than 2 days in a row without some kind of physical activity. ? Avoid being inactive for more than 30 minutes at a time. Take frequent breaks to walk or stretch.  Choose a type of exercise or activity that you enjoy, and set realistic goals.  Start slowly, and gradually increase the intensity of your exercise over time. What do I need to know about managing my  diabetes?   Check your blood glucose before and after exercising. ? If your blood glucose is 240 mg/dL (13.3 mmol/L) or higher before you exercise, check your urine for ketones. If you have ketones in your urine, do not exercise until your blood glucose returns to normal. ? If your blood glucose is 100 mg/dL (5.6 mmol/L) or lower, eat a snack containing 15-20 grams of carbohydrate. Check your blood glucose 15 minutes after the snack to make sure that your level is above 100 mg/dL (5.6 mmol/L) before you start your exercise.  Know the symptoms of low blood glucose (hypoglycemia) and how to treat it. Your risk for hypoglycemia increases during and after exercise. Common symptoms of hypoglycemia can include: ? Hunger. ? Anxiety. ? Sweating and feeling clammy. ? Confusion. ? Dizziness or feeling light-headed. ? Increased heart rate or palpitations. ? Blurry vision. ? Tingling or numbness around the mouth, lips, or tongue. ? Tremors or shakes. ? Irritability.  Keep a rapid-acting carbohydrate snack available before, during, and after exercise to help prevent or treat hypoglycemia.  Avoid injecting insulin into areas of the body that are going to be exercised. For example, avoid injecting insulin into: ? The arms, when playing tennis. ? The legs, when jogging.  Keep records of your exercise habits. Doing this can help you and your health care provider adjust your diabetes management plan as needed. Write down: ? Food that you eat before and after you exercise. ? Blood glucose levels before and after you exercise. ? The type and amount of exercise you have done. ? When your insulin is expected to peak, if you use   insulin. Avoid exercising at times when your insulin is peaking.  When you start a new exercise or activity, work with your health care provider to make sure the activity is safe for you, and to adjust your insulin, medicines, or food intake as needed.  Drink plenty of water while  you exercise to prevent dehydration or heat stroke. Drink enough fluid to keep your urine clear or pale yellow. Summary  Exercising regularly is important for your overall health, especially when you have diabetes (diabetes mellitus).  Exercising has many health benefits, such as increasing muscle strength and bone density and reducing body fat and stress.  Your health care provider or certified diabetes educator can help you make a plan for the type and frequency of exercise (activity plan) that works for you.  When you start a new exercise or activity, work with your health care provider to make sure the activity is safe for you, and to adjust your insulin, medicines, or food intake as needed. This information is not intended to replace advice given to you by your health care provider. Make sure you discuss any questions you have with your health care provider. Document Released: 07/04/2003 Document Revised: 11/05/2016 Document Reviewed: 09/23/2015 Elsevier Patient Education  2020 Elsevier Inc.  

## 2019-03-05 NOTE — Progress Notes (Signed)
Subjective:     Patient ID: Crystal Escobar , female    DOB: [birth year] , 48 y.o.   MRN: [id number]   Chief Complaint  Patient presents with  . Diabetes  . Hypertension    HPI  Diabetes She presents for her follow-up diabetic visit. She has type 2 diabetes mellitus. There are no hypoglycemic associated symptoms. There are no diabetic associated symptoms. Pertinent negatives for diabetes include no blurred vision and no chest pain. There are no hypoglycemic complications. Risk factors for coronary artery disease include diabetes mellitus, dyslipidemia, hypertension, obesity and sedentary lifestyle. When asked about current treatments, none were reported. She is compliant with treatment some of the time. She is following a generally healthy diet. She participates in exercise intermittently. An ACE inhibitor/angiotensin II receptor blocker is being taken.  Hypertension This is a chronic problem. The current episode started more than 1 year ago. The problem has been gradually improving since onset. The problem is uncontrolled. Pertinent negatives include no blurred vision, chest pain, palpitations or shortness of breath.     Past Medical History:  Diagnosis Date  . Abnormal Pap smear 2011  . Diabetes mellitus   . DUB (dysfunctional uterine bleeding) 12/22/2012  . HLD (hyperlipidemia)   . Hypertension      Family History  Problem Relation Age of Onset  . Seizures Father   . Heart attack Mother   . Hypertension Mother   . Diabetes Mother   . Hypertension Sister   . Heart murmur Son   . Sickle cell anemia Maternal Aunt   . Hypertension Maternal Aunt      Current Outpatient Medications:  .  atorvastatin (LIPITOR) 20 MG tablet, Take 20 mg by mouth daily., Disp: , Rfl: 5 .  Boswellia-Glucosamine-Vit D (OSTEO BI-FLEX ONE PER DAY PO), Take by mouth., Disp: , Rfl:  .  cetirizine (ZYRTEC) 10 MG tablet, Take 10 mg by mouth daily., Disp: , Rfl:  .  Cholecalciferol (VITAMIN D3) 125 MCG  (5000 UT) CAPS, Take by mouth. 2 per day, Disp: , Rfl:  .  Cyanocobalamin (VITAMIN B-12 PO), Take by mouth., Disp: , Rfl:  .  hydrochlorothiazide (MICROZIDE) 12.5 MG capsule, Take 1 capsule by mouth once daily, Disp: 90 capsule, Rfl: 1 .  losartan (COZAAR) 50 MG tablet, Take 1 tablet by mouth once daily, Disp: 90 tablet, Rfl: 1 .  nabumetone (RELAFEN) 750 MG tablet, Take 1 tablet (750 mg total) by mouth 2 (two) times daily as needed., Disp: 60 tablet, Rfl: 6 .  Probiotic Product (PROBIOTIC PO), Take by mouth., Disp: , Rfl:  .  Semaglutide (RYBELSUS) 3 MG TABS, Take 1 tablet by mouth daily., Disp: 30 tablet, Rfl: 2 .  cyclobenzaprine (FLEXERIL) 10 MG tablet, Take 1 tablet (10 mg total) by mouth 3 (three) times daily as needed for muscle spasms. (Patient not taking: Reported on 10/25/2018), Disp: 30 tablet, Rfl: 0 .  diclofenac (VOLTAREN) 75 MG EC tablet, Take 1 tablet (75 mg total) by mouth 2 (two) times daily. (Patient not taking: Reported on 03/02/2019), Disp: 30 tablet, Rfl: 0 .  diclofenac sodium (VOLTAREN) 1 % GEL, Apply 4 g topically 4 (four) times daily as needed. (Patient not taking: Reported on 03/02/2019), Disp: 500 g, Rfl: 6 .  medroxyPROGESTERone (PROVERA) 10 MG tablet, Take 10 mg by mouth daily. Take on 1 tablet daily on DAY 1-10 of each month, Disp: , Rfl:  .  meloxicam (MOBIC) 15 MG tablet, Take 1 tablet by mouth once daily (  Patient not taking: Reported on 03/02/2019), Disp: 14 tablet, Rfl: 0 .  OZEMPIC, 0.25 OR 0.5 MG/DOSE, 2 MG/1.5ML SOPN, INJECT 0.5 MG SUBCUTANEOUSLY ON THE SAME DAY EACH WEEK IN THE ABDOMEN, THIGHS, OR UPPER ARM,ROTATING INJECTION SITES (Patient not taking: Reported on 03/02/2019), Disp: 2 mL, Rfl: 0   No Known Allergies   Review of Systems  Constitutional: Negative.   Eyes: Negative for blurred vision.  Respiratory: Negative.  Negative for shortness of breath.   Cardiovascular: Negative.  Negative for chest pain and palpitations.  Gastrointestinal: Negative.    Neurological: Negative.   Psychiatric/Behavioral: Negative.      Today's Vitals   03/02/19 1105  BP: 124/78  Pulse: 65  Temp: 98.2 F (36.8 C)  TempSrc: Oral  Weight: (!) 351 lb 1.6 oz (159.3 kg)  Height: 5' 2.4" (1.585 m)   Body mass index is 63.4 kg/m.   Objective:  Physical Exam Vitals signs and nursing note reviewed.  Constitutional:      Appearance: Normal appearance. She is obese.  HENT:     Head: Normocephalic and atraumatic.  Cardiovascular:     Rate and Rhythm: Normal rate and regular rhythm.     Heart sounds: Normal heart sounds.  Pulmonary:     Effort: Pulmonary effort is normal.     Breath sounds: Normal breath sounds.  Skin:    General: Skin is warm.  Neurological:     General: No focal deficit present.     Mental Status: She is alert.  Psychiatric:        Mood and Affect: Mood normal.        Behavior: Behavior normal.         Assessment And Plan:     1. Uncontrolled type 2 diabetes mellitus with hyperglycemia ([place])  She recently had labs drawn at [facility], these were reviewed in full detail during her visit. Her a1c is now down to 7.3.  She was congratulated on her lifestyle changes and encouraged to keep up the great work.   2. Essential hypertension, benign  Chronic, well controlled. She will continue with current meds. She is encouraged to avoid adding salt to her foods.   3. Morbid obesity with body mass index (BMI) of 60.0 to 69.9 in adult [facility])  She was congratulated on her 12 pound weight loss since June. She is also participating in [place] program.  Recent notes in [place] were reviewed in full detail.  She plans to have gastric sleeve in the near future. She is hoping to have the surgery performed Jan 2021.   [name], MD    THE PATIENT IS ENCOURAGED TO PRACTICE SOCIAL DISTANCING DUE TO THE COVID-19 PANDEMIC.

## 2019-03-06 ENCOUNTER — Telehealth: Payer: Self-pay

## 2019-03-06 NOTE — Telephone Encounter (Signed)
Called pharm to verify insurance coverage. covermymeds is saying no coverage found for pt .

## 2019-03-07 ENCOUNTER — Telehealth: Payer: Self-pay

## 2019-03-07 NOTE — Telephone Encounter (Signed)
Opened telephone call to get insurance information for pa

## 2019-03-21 ENCOUNTER — Encounter: Payer: Self-pay | Admitting: Internal Medicine

## 2019-03-22 ENCOUNTER — Encounter: Payer: Self-pay | Admitting: Internal Medicine

## 2019-03-27 ENCOUNTER — Encounter: Payer: Self-pay | Admitting: Internal Medicine

## 2019-03-29 ENCOUNTER — Other Ambulatory Visit: Payer: Self-pay

## 2019-03-29 ENCOUNTER — Telehealth (INDEPENDENT_AMBULATORY_CARE_PROVIDER_SITE_OTHER): Payer: BC Managed Care – PPO | Admitting: Internal Medicine

## 2019-03-29 ENCOUNTER — Encounter: Payer: Self-pay | Admitting: Internal Medicine

## 2019-03-29 VITALS — Ht 62.4 in

## 2019-03-29 DIAGNOSIS — M170 Bilateral primary osteoarthritis of knee: Secondary | ICD-10-CM | POA: Diagnosis not present

## 2019-03-29 DIAGNOSIS — Z6841 Body Mass Index (BMI) 40.0 and over, adult: Secondary | ICD-10-CM | POA: Diagnosis not present

## 2019-03-29 MED ORDER — TRAMADOL HCL 50 MG PO TABS: 50.0000 mg | ORAL_TABLET | Freq: Four times a day (QID) | ORAL | 0 refills | Status: DC | PRN

## 2019-03-29 NOTE — Progress Notes (Signed)
Virtual Visit via Video   This visit type was conducted due to national recommendations for restrictions regarding the COVID-19 Pandemic (e.g. social distancing) in an effort to limit this patient's exposure and mitigate transmission in our community.  Due to her co-morbid illnesses, this patient is at least at moderate risk for complications without adequate follow up.  This format is felt to be most appropriate for this patient at this time.  All issues noted in this document were discussed and addressed.  A limited physical exam was performed with this format.    This visit type was conducted due to national recommendations for restrictions regarding the COVID-19 Pandemic (e.g. social distancing) in an effort to limit this patient's exposure and mitigate transmission in our community.  Patients identity confirmed using two different identifiers.  This format is felt to be most appropriate for this patient at this time.  All issues noted in this document were discussed and addressed.  No physical exam was performed (except for noted visual exam findings with Video Visits).    Date:  03/29/2019   ID:  [name], DOB [birth year], MRN [id number]  Patient Location:  In car, parked - she just got off of work.   Provider location:   Office    Chief Complaint:  "I need medicines for my knee"  History of Present Illness:    HADE DIMITRIOU is a 48 y.o. female who presents via video conferencing for a telehealth visit today.     The patient does not have symptoms concerning for COVID-19 infection (fever, chills, cough, or new shortness of breath).   She presents today for virtual visit. She prefers this method of contact due to [place] pandemic.  She presents today for further evaluation of b/l knee pain. She has been diagnosed with moderate-severe osteoarthritis of both knees by Ortho. She works at [employer], and stands a lot while at work. This sometimes gives her debilitating knee pain. She  has not had any relief with OTC meds including acetaminophen and ibuprofen.  Knee Pain  The incident occurred more than 1 week ago. There was no injury mechanism. The pain is at a severity of 8/10. The pain is severe. The pain has been fluctuating since onset. Associated symptoms include an inability to bear weight. She has tried acetaminophen and NSAIDs for the symptoms. The treatment provided mild relief.     Past Medical History:  Diagnosis Date  . Abnormal Pap smear 2011  . Diabetes mellitus   . DUB (dysfunctional uterine bleeding) 12/22/2012  . HLD (hyperlipidemia)   . Hypertension    Past Surgical History:  Procedure Laterality Date  . CARPAL TUNNEL RELEASE Right 2016  . CESAREAN SECTION    . KNEE SURGERY    . TUBAL LIGATION       No outpatient medications have been marked as taking for the 03/29/19 encounter (Telemedicine) with Glendale Chard, MD.     Allergies:   Patient has no known allergies.   Social History   Tobacco Use  . Smoking status: Former Smoker    Packs/day: 1.00    Years: 10.00    Pack years: 10.00    Types: Cigars    Quit date: 06/03/2018    Years since quitting: 0.8  . Smokeless tobacco: Never Used  . Tobacco comment: 1 cigar a day  Substance Use Topics  . Alcohol use: Yes    Comment: occasionally   . Drug use: No     Family Hx:  The patient's family history includes Diabetes in her mother; Heart attack in her mother; Heart murmur in her son; Hypertension in her maternal aunt, mother, and sister; Seizures in her father; Sickle cell anemia in her maternal aunt.  ROS:   Please see the history of present illness.    Review of Systems  Constitutional: Negative.   Respiratory: Negative.   Cardiovascular: Negative.   Gastrointestinal: Negative.   Musculoskeletal: Positive for joint pain.  Neurological: Negative.   Psychiatric/Behavioral: Negative.     All other systems reviewed and are negative.   Labs/Other Tests and Data Reviewed:     Recent Labs: 06/06/2018: ALT 30 08/02/2018: BUN 9; Creatinine, Ser 0.67; Hemoglobin 12.5; Platelets 328; Potassium 4.5; Sodium 132; TSH 2.240   Recent Lipid Panel Lab Results  Component Value Date/Time   CHOL 208 (H) 08/02/2018 10:46 AM   TRIG 119 08/02/2018 10:46 AM   HDL 47 08/02/2018 10:46 AM   CHOLHDL 4.4 08/02/2018 10:46 AM   CHOLHDL 4.1 Ratio 06/01/2006 08:54 PM   LDLCALC 137 (H) 08/02/2018 10:46 AM    Wt Readings from Last 3 Encounters:  03/02/19 (!) 351 lb 1.6 oz (159.3 kg)  10/25/18 (!) 363 lb 3.2 oz (164.7 kg)  08/02/18 (!) 362 lb 6.4 oz (164.4 kg)     Exam:    Vital Signs:  Ht 5' 2.4" (1.585 m)   BMI 63.40 kg/m     Physical Exam  Constitutional: She is oriented to person, place, and time and well-developed, well-nourished, and in no distress.  HENT:  Head: Normocephalic and atraumatic.  Neck: Normal range of motion.  Pulmonary/Chest: Effort normal.  Neurological: She is alert and oriented to person, place, and time.  Psychiatric: Affect normal.  Nursing note and vitals reviewed.   ASSESSMENT & PLAN:    1. Primary osteoarthritis of both knees  Chronic. She was given rx tramadol to use prn. She will let me know if this is effective for her. PDMP report reviewed. Possible side effects were discussed with the patient in detail.   2. Morbid obesity with body mass index (BMI) of 60.0 to 69.9 in adult [facility])  She is planning to have bariatric surgery Jan 2021. She is encouraged to let me know when she has surgical date scheduled.     COVID-19 Education: The signs and symptoms of COVID-19 were discussed with the patient and how to seek care for testing (follow up with PCP or arrange E-visit).  The importance of social distancing was discussed today.  Patient Risk:   After full review of this patients clinical status, I feel that they are at least moderate risk at this time.      Medication Adjustments/Labs and Tests Ordered: Current medicines are reviewed  at length with the patient today.  Concerns regarding medicines are outlined above.   Tests Ordered: No orders of the defined types were placed in this encounter.   Medication Changes: Meds ordered this encounter  Medications  . traMADol (ULTRAM) 50 MG tablet    Sig: Take 1 tablet (50 mg total) by mouth every 6 (six) hours as needed.    Dispense:  20 tablet    Refill:  0    Disposition:  Follow up prn  Signed, [name], MD

## 2019-03-29 NOTE — Patient Instructions (Signed)
Chronic Knee Pain, Adult Chronic knee pain is pain in one or both knees that lasts longer than 3 months. Symptoms of chronic knee pain may include swelling, stiffness, and discomfort. Age-related wear and tear (osteoarthritis) of the knee joint is the most common cause of chronic knee pain. Other possible causes include:  A long-term immune-related disease that causes inflammation of the knee (rheumatoid arthritis). This usually affects both knees.  Inflammatory arthritis, such as gout or pseudogout.  An injury to the knee that causes arthritis.  An injury to the knee that damages the ligaments. Ligaments are strong tissues that connect bones to each other.  Runner's knee or pain behind the kneecap. Treatment for chronic knee pain depends on the cause. The main treatments for chronic knee pain are physical therapy and weight loss. This condition may also be treated with medicines, injections, a knee sleeve or brace, and by using crutches. Rest, ice, compression (pressure), and elevation (RICE) therapy may also be recommended. Follow these instructions at home: If you have a knee sleeve or brace:   Wear it as told by your health care provider. Remove it only as told by your health care provider.  Loosen it if your toes tingle, become numb, or turn cold and blue.  Keep it clean.  If the sleeve or brace is not waterproof: ? Do not let it get wet. ? Remove it if allowed by your health care provider, or cover it with a watertight covering when you take a bath or a shower. Managing pain, stiffness, and swelling      If directed, apply heat to the affected area as often as told by your health care provider. Use the heat source that your health care provider recommends, such as a moist heat pack or a heating pad. ? If you have a removable sleeve or brace, remove it as told by your health care provider. ? Place a towel between your skin and the heat source. ? Leave the heat on for 20-30  minutes. ? Remove the heat if your skin turns bright red. This is especially important if you are unable to feel pain, heat, or cold. You may have a greater risk of getting burned.  If directed, put ice on the affected area. ? If you have a removable sleeve or brace, remove it as told by your health care provider. ? Put ice in a plastic bag. ? Place a towel between your skin and the bag. ? Leave the ice on for 20 minutes, 2-3 times a day.  Move your toes often to reduce stiffness and swelling.  Raise (elevate) the injured area above the level of your heart while you are sitting or lying down. Activity  Avoid activities where both feet leave the ground at the same time (high-impact activities). Examples are running, jumping rope, and doing jumping jacks.  Return to your normal activities as told by your health care provider. Ask your health care provider what activities are safe for you.  Follow the exercise plan that your health care provider designed for you. Your health care provider may suggest that you: ? Avoid activities that make knee pain worse. This may require you to change your exercise routines, sport participation, or job duties. ? Wear shoes with cushioned soles. ? Avoid high-impact activities or sports that require running and sudden changes in direction. ? Do physical therapy as told by your health care provider. Physical therapy is planned to match your needs and abilities. It may include  exercises for strength, flexibility, stability, and endurance. ? Do exercises that increase balance and strength, such as tai chi and yoga.  Do not use the injured limb to support your body weight until your health care provider says that you can. Use crutches, a cane, or a walker, as told by your health care provider. General instructions  Take over-the-counter and prescription medicines only as told by your health care provider.  Lose weight if you are overweight. Losing even a little  weight can reduce knee pain. Ask your health care provider what your ideal weight is, and how to safely lose extra weight. A food expert (dietitian) may be able to help you plan your meals.  Do not use any products that contain nicotine or tobacco, such as cigarettes, e-cigarettes, and chewing tobacco. These can delay healing. If you need help quitting, ask your health care provider.  Keep all follow-up visits as told by your health care provider. This is important. Contact a health care provider if:  You have knee pain that is not getting better or gets worse.  You are unable to do your physical therapy exercises due to knee pain. Get help right away if:  Your knee swells and the swelling becomes worse.  You cannot move your knee.  You have severe knee pain. Summary  Knee pain that lasts more than 3 months is considered chronic knee pain.  The main treatments for chronic knee pain are physical therapy and weight loss. You may also need to take medicines, wear a knee sleeve or brace, use crutches, and apply ice or heat.  Losing even a little weight can reduce knee pain. Ask your health care provider what your ideal weight is, and how to safely lose extra weight. A food expert (dietitian) may be able to help you plan your meals.  Work with a physical therapist to make a safe exercise program, as told by your health care provider. This information is not intended to replace advice given to you by your health care provider. Make sure you discuss any questions you have with your health care provider. Document Released: 06/23/2018 Document Revised: 06/23/2018 Document Reviewed: 06/23/2018 Elsevier Patient Education  2020 [employer].

## 2019-04-26 ENCOUNTER — Other Ambulatory Visit: Payer: Self-pay | Admitting: Internal Medicine

## 2019-04-26 NOTE — Telephone Encounter (Signed)
Refill request for tramadol

## 2019-04-28 HISTORY — PX: GASTRIC BYPASS: SHX52

## 2019-04-30 ENCOUNTER — Encounter: Payer: Self-pay | Admitting: Internal Medicine

## 2019-05-01 ENCOUNTER — Telehealth (INDEPENDENT_AMBULATORY_CARE_PROVIDER_SITE_OTHER): Payer: BC Managed Care – PPO | Admitting: Nurse Practitioner

## 2019-05-01 ENCOUNTER — Other Ambulatory Visit: Payer: Self-pay

## 2019-05-01 ENCOUNTER — Encounter: Payer: Self-pay | Admitting: Nurse Practitioner

## 2019-05-01 VITALS — Ht 62.0 in | Wt 343.0 lb

## 2019-05-01 DIAGNOSIS — R05 Cough: Secondary | ICD-10-CM | POA: Diagnosis not present

## 2019-05-01 DIAGNOSIS — R519 Headache, unspecified: Secondary | ICD-10-CM | POA: Diagnosis not present

## 2019-05-01 DIAGNOSIS — Z20822 Contact with and (suspected) exposure to covid-19: Secondary | ICD-10-CM

## 2019-05-01 DIAGNOSIS — R059 Cough, unspecified: Secondary | ICD-10-CM

## 2019-05-01 MED ORDER — AZITHROMYCIN 250 MG PO TABS: ORAL_TABLET | ORAL | 0 refills | Status: AC

## 2019-05-01 NOTE — Progress Notes (Addendum)
Virtual Visit via Video   This visit type was conducted due to national recommendations for restrictions regarding the COVID-19 Pandemic (e.g. social distancing) in an effort to limit this patient's exposure and mitigate transmission in our community.  Due to her co-morbid illnesses, this patient is at least at moderate risk for complications without adequate follow up.  This format is felt to be most appropriate for this patient at this time.  All issues noted in this document were discussed and addressed.  A limited physical exam was performed with this format.    This visit type was conducted due to national recommendations for restrictions regarding the COVID-19 Pandemic (e.g. social distancing) in an effort to limit this patient's exposure and mitigate transmission in our community.  Patients identity confirmed using two different identifiers.  This format is felt to be most appropriate for this patient at this time.  All issues noted in this document were discussed and addressed.  No physical exam was performed (except for noted visual exam findings with Video Visits).    Date:  05/09/2019   ID:  Crystal Escobar, DOB 02-16-1971, MRN [id number]  Patient Location:  Home - spoke with Crystal Escobar  Provider location:   Office    Chief Complaint:  Fever, chills and aches  History of Present Illness:    Crystal Escobar is a 49 y.o. female who presents via video conferencing for a telehealth visit today.    The patient does have symptoms concerning for COVID-19 infection (fever, chills, cough, or new shortness of breath).   She feels like she has pain to her lungs since Friday, fatigued and drained.  She went today to be tested today.  She went to [place] - stays in [place].  Denies shortness of breath.  She is taking theraflu. She is able to taste and smell.  Denies exposure to covid.  She works at [employer].    Headache  This is a new problem. The current episode started  in the past 7 days. The pain quality is not similar to prior headaches. Associated symptoms include a fever. Pertinent negatives include no abdominal pain, dizziness, drainage, sinus pressure or sore throat. There is no history of cancer.     Past Medical History:  Diagnosis Date  . Abnormal Pap smear 2011  . Diabetes mellitus   . DUB (dysfunctional uterine bleeding) 12/22/2012  . HLD (hyperlipidemia)   . Hypertension    Past Surgical History:  Procedure Laterality Date  . CARPAL TUNNEL RELEASE Right 2016  . CESAREAN SECTION    . KNEE SURGERY    . TUBAL LIGATION       Current Meds  Medication Sig  . atorvastatin (LIPITOR) 20 MG tablet Take 20 mg by mouth daily.  . cetirizine (ZYRTEC) 10 MG tablet Take 10 mg by mouth as needed.   . Cholecalciferol (VITAMIN D3) 125 MCG (5000 UT) CAPS Take by mouth. 2 per day  . Cyanocobalamin (VITAMIN B-12 PO) Take by mouth.  . hydrochlorothiazide (MICROZIDE) 12.5 MG capsule Take 1 capsule by mouth once daily  . losartan (COZAAR) 50 MG tablet Take 1 tablet by mouth once daily  . medroxyPROGESTERone (PROVERA) 10 MG tablet Take 10 mg by mouth daily. Take on 1 tablet daily on DAY 1-10 of each month  . Probiotic Product (PROBIOTIC PO) Take by mouth.  . Semaglutide (RYBELSUS) 3 MG TABS Take 1 tablet by mouth daily.  . traMADol (ULTRAM) 50 MG tablet TAKE 1  TABLET BY MOUTH EVERY 6 HOURS AS NEEDED     Allergies:   Patient has no known allergies.   Social History   Tobacco Use  . Smoking status: Former Smoker    Packs/day: 1.00    Years: 10.00    Pack years: 10.00    Types: Cigars    Quit date: 06/03/2018    Years since quitting: 0.9  . Smokeless tobacco: Never Used  . Tobacco comment: 1 cigar a day  Substance Use Topics  . Alcohol use: Yes    Comment: occasionally   . Drug use: No     Family Hx: The patient's family history includes Diabetes in her mother; Heart attack in her mother; Heart murmur in her son; Hypertension in her maternal aunt,  mother, and sister; Seizures in her father; Sickle cell anemia in her maternal aunt.  ROS:   Please see the history of present illness.    Review of Systems  Constitutional: Positive for fever and malaise/fatigue.  HENT: Negative for sinus pressure and sore throat.   Respiratory: Negative.        States "her lungs hurt"  Cardiovascular: Negative.   Gastrointestinal: Negative for abdominal pain.  Neurological: Positive for headaches. Negative for dizziness.  Psychiatric/Behavioral: Negative.     All other systems reviewed and are negative.   Labs/Other Tests and Data Reviewed:    Recent Labs: 06/06/2018: ALT 30 08/02/2018: BUN 9; Creatinine, Ser 0.67; Hemoglobin 12.5; Platelets 328; Potassium 4.5; Sodium 132; TSH 2.240   Recent Lipid Panel Lab Results  Component Value Date/Time   CHOL 208 (H) 08/02/2018 10:46 AM   TRIG 119 08/02/2018 10:46 AM   HDL 47 08/02/2018 10:46 AM   CHOLHDL 4.4 08/02/2018 10:46 AM   CHOLHDL 4.1 Ratio 06/01/2006 08:54 PM   LDLCALC 137 (H) 08/02/2018 10:46 AM    Wt Readings from Last 3 Encounters:  05/01/19 (!) 343 lb (155.6 kg)  03/02/19 (!) 351 lb 1.6 oz (159.3 kg)  10/25/18 (!) 363 lb 3.2 oz (164.7 kg)     Exam:    Vital Signs:  Ht 5\' 2"  (1.575 m)   Wt (!) 343 lb (155.6 kg)   LMP 04/01/2019   BMI 62.74 kg/m     Physical Exam  Constitutional: She is oriented to person, place, and time and well-developed, well-nourished, and in no distress. No distress.  Pulmonary/Chest: No respiratory distress.  Taking deep breaths when talking but able to speak in complete sentences  Neurological: She is alert and oriented to person, place, and time.  Psychiatric: Mood, memory, affect and judgment normal.    ASSESSMENT & PLAN:   1. Cough  Slight cough  Will treat with azithromycin due to high suspicion of covid and having to take several deep breaths when talking  She is to self isolate until her results return if positive needs to remain self  quarantined for 14 days - azithromycin (ZITHROMAX) 250 MG tablet; Take 2 tablets (500 mg) on  Day 1,  followed by 1 tablet (250 mg) once daily on Days 2 through 5.  Dispense: 6 each; Refill: 0  2. Acute nonintractable headache, unspecified headache type  Persistent headache  Advised to take tylenol as needed and rest  COVID-19 Education: The signs and symptoms of COVID-19 were discussed with the patient and how to seek care for testing (follow up with PCP or arrange E-visit).  The importance of social distancing was discussed today.  Patient Risk:   After full review of this  patients clinical status, I feel that they are at least moderate risk at this time.  Time:   Today, I have spent  minutes/ seconds with the patient with telehealth technology discussing above diagnoses.     Medication Adjustments/Labs and Tests Ordered: Current medicines are reviewed at length with the patient today.  Concerns regarding medicines are outlined above.   Tests Ordered: No orders of the defined types were placed in this encounter.   Medication Changes: Meds ordered this encounter  Medications  . azithromycin (ZITHROMAX) 250 MG tablet    Sig: Take 2 tablets (500 mg) on  Day 1,  followed by 1 tablet (250 mg) once daily on Days 2 through 5.    Dispense:  6 each    Refill:  0    Disposition:  Follow up prn  Signed, Minette Brine, FNP

## 2019-05-04 ENCOUNTER — Encounter: Payer: Self-pay | Admitting: Internal Medicine

## 2019-05-09 ENCOUNTER — Encounter: Payer: Self-pay | Admitting: Internal Medicine

## 2019-05-12 ENCOUNTER — Encounter: Payer: Self-pay | Admitting: Internal Medicine

## 2019-05-13 ENCOUNTER — Encounter: Payer: Self-pay | Admitting: Internal Medicine

## 2019-05-15 ENCOUNTER — Encounter: Payer: Self-pay | Admitting: Internal Medicine

## 2019-05-25 ENCOUNTER — Encounter: Payer: Self-pay | Admitting: Internal Medicine

## 2019-05-29 HISTORY — PX: GASTRIC BYPASS: SHX52

## 2019-05-30 ENCOUNTER — Other Ambulatory Visit: Payer: Self-pay | Admitting: Internal Medicine

## 2019-06-15 ENCOUNTER — Encounter: Payer: Self-pay | Admitting: Internal Medicine

## 2019-06-15 ENCOUNTER — Inpatient Hospital Stay: Payer: Self-pay | Admitting: Internal Medicine

## 2019-06-15 ENCOUNTER — Telehealth (INDEPENDENT_AMBULATORY_CARE_PROVIDER_SITE_OTHER): Payer: BC Managed Care – PPO | Admitting: Internal Medicine

## 2019-06-15 ENCOUNTER — Other Ambulatory Visit: Payer: Self-pay

## 2019-06-15 VITALS — Ht 62.0 in

## 2019-06-15 DIAGNOSIS — Z6841 Body Mass Index (BMI) 40.0 and over, adult: Secondary | ICD-10-CM | POA: Diagnosis not present

## 2019-06-15 DIAGNOSIS — Z9884 Bariatric surgery status: Secondary | ICD-10-CM

## 2019-06-15 DIAGNOSIS — Z09 Encounter for follow-up examination after completed treatment for conditions other than malignant neoplasm: Secondary | ICD-10-CM

## 2019-06-15 DIAGNOSIS — E1165 Type 2 diabetes mellitus with hyperglycemia: Secondary | ICD-10-CM

## 2019-06-15 DIAGNOSIS — I10 Essential (primary) hypertension: Secondary | ICD-10-CM

## 2019-06-15 NOTE — Patient Instructions (Signed)

## 2019-06-15 NOTE — Progress Notes (Signed)
Virtual Visit via Video   This visit type was conducted due to national recommendations for restrictions regarding the COVID-19 Pandemic (e.g. social distancing) in an effort to limit this patient's exposure and mitigate transmission in our community.  Due to her co-morbid illnesses, this patient is at least at moderate risk for complications without adequate follow up.  This format is felt to be most appropriate for this patient at this time.  All issues noted in this document were discussed and addressed.  A limited physical exam was performed with this format.    This visit type was conducted due to national recommendations for restrictions regarding the COVID-19 Pandemic (e.g. social distancing) in an effort to limit this patient's exposure and mitigate transmission in our community.  Patients identity confirmed using two different identifiers.  This format is felt to be most appropriate for this patient at this time.  All issues noted in this document were discussed and addressed.  No physical exam was performed (except for noted visual exam findings with Video Visits).    Date:  06/15/2019   ID:  Crystal Escobar, DOB 12-30-1970, MRN [id number]  Patient Location:  Home  Provider location:   Office    Chief Complaint:  "I have hospital f/u"  History of Present Illness:    Crystal Escobar is a 49 y.o. female who presents via video conferencing for a telehealth visit today.  =  The patient does not have symptoms concerning for COVID-19 infection (fever, chills, cough, or new shortness of breath).   She presents today for virtual visit. She prefers this method of contact due to COVID-19 pandemic. She presents today for hospital f/u. She was admitted to [facility] on 2/10 for gastric bypass surgery. She did not have any post-operative complications. She was discharged in stable condition on 2/11 without any issues. She feels well. She has no concerns. She has had some intermittent  nausea, it is fleeting. Her starting weight was 349 pounds. She reports that she has since lost 20 pounds. She is having regular bowel movements, and has been active around the house.     Past Medical History:  Diagnosis Date  . Abnormal Pap smear 2011  . Diabetes mellitus   . DUB (dysfunctional uterine bleeding) 12/22/2012  . HLD (hyperlipidemia)   . Hypertension    Past Surgical History:  Procedure Laterality Date  . CARPAL TUNNEL RELEASE Right 2016  . CESAREAN SECTION    . KNEE SURGERY    . TUBAL LIGATION       Current Meds  Medication Sig  . gabapentin (NEURONTIN) 300 MG capsule Take 300 mg by mouth 3 (three) times daily. For 10 days  . losartan (COZAAR) 25 MG tablet Take 25 mg by mouth daily.  . medroxyPROGESTERone (PROVERA) 10 MG tablet Take 10 mg by mouth daily. Take on 1 tablet daily on DAY 1-10 of each month  . pantoprazole (PROTONIX) 40 MG tablet Take 40 mg by mouth daily.     Allergies:   Patient has no known allergies.   Social History   Tobacco Use  . Smoking status: Former Smoker    Packs/day: 1.00    Years: 10.00    Pack years: 10.00    Types: Cigars    Quit date: 06/03/2018    Years since quitting: 1.0  . Smokeless tobacco: Never Used  . Tobacco comment: 1 cigar a day  Substance Use Topics  . Alcohol use: Yes    Comment: occasionally   .  Drug use: No     Family Hx: The patient's family history includes Diabetes in her mother; Heart attack in her mother; Heart murmur in her son; Hypertension in her maternal aunt, mother, and sister; Seizures in her father; Sickle cell anemia in her maternal aunt.  ROS:   Please see the history of present illness.    Review of Systems  Constitutional: Negative.   Respiratory: Negative.   Cardiovascular: Negative.   Gastrointestinal: Negative.   Neurological: Negative.   Psychiatric/Behavioral: Negative.     All other systems reviewed and are negative.   Labs/Other Tests and Data Reviewed:    Recent Labs:  08/02/2018: BUN 9; Creatinine, Ser 0.67; Hemoglobin 12.5; Platelets 328; Potassium 4.5; Sodium 132; TSH 2.240   Recent Lipid Panel Lab Results  Component Value Date/Time   CHOL 208 (H) 08/02/2018 10:46 AM   TRIG 119 08/02/2018 10:46 AM   HDL 47 08/02/2018 10:46 AM   CHOLHDL 4.4 08/02/2018 10:46 AM   CHOLHDL 4.1 Ratio 06/01/2006 08:54 PM   LDLCALC 137 (H) 08/02/2018 10:46 AM    Wt Readings from Last 3 Encounters:  05/01/19 (!) 343 lb (155.6 kg)  03/02/19 (!) 351 lb 1.6 oz (159.3 kg)  10/25/18 (!) 363 lb 3.2 oz (164.7 kg)     Exam:    Vital Signs:  Ht 5\' 2"  (1.575 m)   LMP 05/10/2019   BMI 62.74 kg/m     Physical Exam  Constitutional: She is oriented to person, place, and time and well-developed, well-nourished, and in no distress.  HENT:  Head: Normocephalic and atraumatic.  Pulmonary/Chest: Effort normal.  Musculoskeletal:     Cervical back: Normal range of motion.  Neurological: She is alert and oriented to person, place, and time.  Psychiatric: Affect normal.  Nursing note and vitals reviewed.   ASSESSMENT & PLAN:    1. Morbid obesity with body mass index (BMI) of 60.0 to 69.9 in adult [facility])  TCM PERFORMED. A MEMBER OF THE CLINICAL TEAM SPOKE WITH THE PATIENT UPON DISCHARGE. DISCHARGE SUMMARY WAS REVIEWED IN FULL DETAIL DURING THE VISIT. MEDS RECONCILED AND COMPARED TO DISCHARGE MEDS. MEDICATION LIST WAS UPDATED AND REVIEWED WITH THE PATIENT. GREATER THAN 50% FACE TO FACE TIME WAS SPENT IN COUNSELING AND COORDINATION OF CARE. ALL QUESTIONS WERE ANSWERED TO THE SATISFACTION OF THE PATIENT.    2. Uncontrolled type 2 diabetes mellitus with hyperglycemia (HCC)  Chronic. Pt advised that her need for medication will decrease as she continues to lose weight.   3. Essential hypertension, benign  Chronic, she was unable to take vital signs today. She will continue with current meds.     COVID-19 Education: The signs and symptoms of COVID-19 were discussed with the  patient and how to seek care for testing (follow up with PCP or arrange E-visit).  The importance of social distancing was discussed today.  Patient Risk:   After full review of this patients clinical status, I feel that they are at least moderate risk at this time.  Time:   Today, I have spent 30 minutes/ seconds with the patient with telehealth technology discussing above diagnoses.    I personally spent 30 minutes face-to-face and non-face-to-face in the care of this patient, which includes all pre-, intra-, and post visit time on the date of service.  Medication Adjustments/Labs and Tests Ordered: Current medicines are reviewed at length with the patient today.  Concerns regarding medicines are outlined above.   Tests Ordered: No orders of the defined types  were placed in this encounter.   Medication Changes: No orders of the defined types were placed in this encounter.   Disposition:  Follow up prn  Signed, [name], MD

## 2019-06-21 ENCOUNTER — Other Ambulatory Visit: Payer: Self-pay | Admitting: Nurse Practitioner

## 2019-06-22 NOTE — Telephone Encounter (Signed)
Refill request tramadol

## 2019-07-03 ENCOUNTER — Encounter: Payer: Self-pay | Admitting: Internal Medicine

## 2019-07-04 ENCOUNTER — Ambulatory Visit (INDEPENDENT_AMBULATORY_CARE_PROVIDER_SITE_OTHER): Payer: BC Managed Care – PPO | Admitting: Internal Medicine

## 2019-07-04 ENCOUNTER — Other Ambulatory Visit: Payer: Self-pay

## 2019-07-04 ENCOUNTER — Encounter: Payer: Self-pay | Admitting: Internal Medicine

## 2019-07-04 VITALS — BP 118/76 | HR 71 | Temp 97.4°F | Ht 62.0 in | Wt 329.2 lb

## 2019-07-04 DIAGNOSIS — Z6841 Body Mass Index (BMI) 40.0 and over, adult: Secondary | ICD-10-CM

## 2019-07-04 DIAGNOSIS — N898 Other specified noninflammatory disorders of vagina: Secondary | ICD-10-CM | POA: Diagnosis not present

## 2019-07-04 DIAGNOSIS — R6889 Other general symptoms and signs: Secondary | ICD-10-CM | POA: Diagnosis not present

## 2019-07-04 DIAGNOSIS — Z9884 Bariatric surgery status: Secondary | ICD-10-CM

## 2019-07-04 LAB — POCT URINALYSIS DIPSTICK
Glucose, UA: NEGATIVE
Ketones, UA: 40
Leukocytes, UA: NEGATIVE
Nitrite, UA: NEGATIVE
Protein, UA: POSITIVE — AB
Spec Grav, UA: 1.02 (ref 1.010–1.025)
Urobilinogen, UA: 1 E.U./dL
pH, UA: 6 (ref 5.0–8.0)

## 2019-07-04 MED ORDER — FLUCONAZOLE 150 MG PO TABS: ORAL_TABLET | ORAL | 1 refills | Status: DC

## 2019-07-04 NOTE — Patient Instructions (Signed)

## 2019-07-04 NOTE — Progress Notes (Signed)
This visit occurred during the SARS-CoV-2 public health emergency.  Safety protocols were in place, including screening questions prior to the visit, additional usage of staff PPE, and extensive cleaning of exam room while observing appropriate contact time as indicated for disinfecting solutions.  Subjective:     Patient ID: Crystal Escobar , female    DOB: 1970-12-11 , 49 y.o.   MRN: [id number]   Chief Complaint  Patient presents with  . Vaginitis    HPI  She presents today for further evaluation of vaginitis. She c/o vaginal itching. She denies having any vaginal discharge. She denies dysuria. No pelvic pain.     Past Medical History:  Diagnosis Date  . Abnormal Pap smear 2011  . Diabetes mellitus   . DUB (dysfunctional uterine bleeding) 12/22/2012  . HLD (hyperlipidemia)   . Hypertension      Family History  Problem Relation Age of Onset  . Seizures Father   . Heart attack Mother   . Hypertension Mother   . Diabetes Mother   . Hypertension Sister   . Heart murmur Son   . Sickle cell anemia Maternal Aunt   . Hypertension Maternal Aunt      Current Outpatient Medications:  .  Boswellia-Glucosamine-Vit D (OSTEO BI-FLEX ONE PER DAY PO), Take by mouth., Disp: , Rfl:  .  cetirizine (ZYRTEC) 10 MG tablet, Take 10 mg by mouth as needed. , Disp: , Rfl:  .  Cholecalciferol (VITAMIN D3) 125 MCG (5000 UT) CAPS, Take by mouth. 2 per day, Disp: , Rfl:  .  losartan (COZAAR) 25 MG tablet, Take 25 mg by mouth daily., Disp: , Rfl:  .  medroxyPROGESTERone (PROVERA) 10 MG tablet, Take 10 mg by mouth daily. Take on 1 tablet daily on DAY 1-10 of each month, Disp: , Rfl:  .  Multiple Vitamin (MULTIVITAMIN) capsule, Take 1 capsule by mouth daily., Disp: , Rfl:  .  pantoprazole (PROTONIX) 40 MG tablet, Take 40 mg by mouth daily., Disp: , Rfl:  .  Probiotic Product (PROBIOTIC PO), Take by mouth., Disp: , Rfl:  .  atorvastatin (LIPITOR) 20 MG tablet, Take 1 tablet (20 mg total) by mouth  daily., Disp: 90 tablet, Rfl: 1 .  traMADol (ULTRAM) 50 MG tablet, TAKE 1 TABLET BY MOUTH EVERY 6 HOURS AS NEEDED, Disp: 30 tablet, Rfl: 0   No Known Allergies   Review of Systems  Constitutional: Negative.   Respiratory: Negative.   Cardiovascular: Negative.   Gastrointestinal: Negative.   Endocrine: Positive for cold intolerance.       She had bariatric surgery in Feb. She reports being intolerant of cold since the surgery. She admits compliance with post-surgical supplementation.   Neurological: Negative.   Psychiatric/Behavioral: Negative.      Today's Vitals   07/04/19 1216  BP: 118/76  Pulse: 71  Temp: (!) 97.4 F (36.3 C)  TempSrc: Oral  SpO2: 94%  Weight: (!) 329 lb 3.2 oz (149.3 kg)  Height: 5\' 2"  (1.575 m)   Body mass index is 60.21 kg/m.   Wt Readings from Last 3 Encounters:  08/03/19 (!) 318 lb 9.6 oz (144.5 kg)  07/04/19 (!) 329 lb 3.2 oz (149.3 kg)  05/01/19 (!) 343 lb (155.6 kg)     Objective:  Physical Exam Vitals and nursing note reviewed.  Constitutional:      Appearance: Normal appearance.  HENT:     Head: Normocephalic and atraumatic.  Cardiovascular:     Rate and Rhythm: Normal rate and  regular rhythm.     Heart sounds: Normal heart sounds.  Pulmonary:     Effort: Pulmonary effort is normal.     Breath sounds: Normal breath sounds.  Skin:    General: Skin is warm.  Neurological:     General: No focal deficit present.     Mental Status: She is alert.  Psychiatric:        Mood and Affect: Mood normal.        Behavior: Behavior normal.         Assessment And Plan:     1. Vaginal itching  I will check urinalysis. She declines vaginal exam - she is followed by GYN. U/a negative, she was given rx fluconazole.    - POCT Urinalysis Dipstick (81002)  2. Cold intolerance  I will check CBC to r/o anemia. Thyroid function checked in April 2020 and was normal. [facility] checked TSH in Oct 2020 and it was also normal at that time.   - CBC no  Diff  3. Class 3 severe obesity due to excess calories with serious comorbidity and body mass index (BMI) of 60.0 to 69.9 in adult [facility])  She was congratulated on her 25 pound weight loss since Jan 2021. She is encouraged to incorporate more exercise into her daily routine.   4. Status post bariatric surgery  I will check iron levels to determine if additional iron supplementation is necessary.   - Iron and IBC [id number]) - Ferritin    [name], MD    THE PATIENT IS ENCOURAGED TO PRACTICE SOCIAL DISTANCING DUE TO THE COVID-19 PANDEMIC.

## 2019-07-05 ENCOUNTER — Ambulatory Visit: Payer: BC Managed Care – PPO | Admitting: Internal Medicine

## 2019-07-05 ENCOUNTER — Encounter: Payer: Self-pay | Admitting: Internal Medicine

## 2019-07-05 LAB — IRON AND TIBC
Iron Saturation: 12 % — ABNORMAL LOW (ref 15–55)
Iron: 35 ug/dL (ref 27–159)
Total Iron Binding Capacity: 291 ug/dL (ref 250–450)
UIBC: 256 ug/dL (ref 131–425)

## 2019-07-05 LAB — CBC
Hematocrit: 39.1 % (ref 34.0–46.6)
Hemoglobin: 12.5 g/dL (ref 11.1–15.9)
MCH: 26.7 pg (ref 26.6–33.0)
MCHC: 32 g/dL (ref 31.5–35.7)
MCV: 83 fL (ref 79–97)
Platelets: 299 10*3/uL (ref 150–450)
RBC: 4.69 x10E6/uL (ref 3.77–5.28)
RDW: 16.5 % — ABNORMAL HIGH (ref 11.7–15.4)
WBC: 4.6 10*3/uL (ref 3.4–10.8)

## 2019-07-05 LAB — FERRITIN: Ferritin: 29 ng/mL (ref 15–150)

## 2019-07-14 ENCOUNTER — Other Ambulatory Visit: Payer: Self-pay | Admitting: Internal Medicine

## 2019-07-17 NOTE — Telephone Encounter (Signed)
Tramadol refill

## 2019-07-26 ENCOUNTER — Encounter: Payer: Self-pay | Admitting: Internal Medicine

## 2019-08-03 ENCOUNTER — Ambulatory Visit: Payer: BC Managed Care – PPO | Admitting: Internal Medicine

## 2019-08-03 ENCOUNTER — Encounter: Payer: Self-pay | Admitting: Internal Medicine

## 2019-08-03 ENCOUNTER — Other Ambulatory Visit: Payer: Self-pay

## 2019-08-03 VITALS — BP 128/74 | HR 88 | Temp 97.3°F | Ht 62.6 in | Wt 318.6 lb

## 2019-08-03 DIAGNOSIS — G4733 Obstructive sleep apnea (adult) (pediatric): Secondary | ICD-10-CM

## 2019-08-03 DIAGNOSIS — I10 Essential (primary) hypertension: Secondary | ICD-10-CM | POA: Diagnosis not present

## 2019-08-03 DIAGNOSIS — E1165 Type 2 diabetes mellitus with hyperglycemia: Secondary | ICD-10-CM

## 2019-08-03 DIAGNOSIS — Z0000 Encounter for general adult medical examination without abnormal findings: Secondary | ICD-10-CM | POA: Diagnosis not present

## 2019-08-03 LAB — POCT UA - MICROALBUMIN
Creatinine, POC: 300 mg/dL
Microalbumin Ur, POC: 150 mg/L

## 2019-08-03 LAB — POCT URINALYSIS DIPSTICK
Blood, UA: NEGATIVE
Glucose, UA: NEGATIVE
Leukocytes, UA: NEGATIVE
Nitrite, UA: NEGATIVE
Protein, UA: POSITIVE — AB
Spec Grav, UA: >=1.03 — AB (ref 1.010–1.025)
Urobilinogen, UA: 0.2 E.U./dL
pH, UA: 5.5 (ref 5.0–8.0)

## 2019-08-03 NOTE — Patient Instructions (Signed)
Health Maintenance, Female Adopting a healthy lifestyle and getting preventive care are important in promoting health and wellness. Ask your health care provider about:  The right schedule for you to have regular tests and exams.  Things you can do on your own to prevent diseases and keep yourself healthy. What should I know about diet, weight, and exercise? Eat a healthy diet   Eat a diet that includes plenty of vegetables, fruits, low-fat dairy products, and lean protein.  Do not eat a lot of foods that are high in solid fats, added sugars, or sodium. Maintain a healthy weight Body mass index (BMI) is used to identify weight problems. It estimates body fat based on height and weight. Your health care provider can help determine your BMI and help you achieve or maintain a healthy weight. Get regular exercise Get regular exercise. This is one of the most important things you can do for your health. Most adults should:  Exercise for at least 150 minutes each week. The exercise should increase your heart rate and make you sweat (moderate-intensity exercise).  Do strengthening exercises at least twice a week. This is in addition to the moderate-intensity exercise.  Spend less time sitting. Even light physical activity can be beneficial. Watch cholesterol and blood lipids Have your blood tested for lipids and cholesterol at 47 years of age, then have this test every 5 years. Have your cholesterol levels checked more often if:  Your lipid or cholesterol levels are high.  You are older than 47 years of age.  You are at high risk for heart disease. What should I know about cancer screening? Depending on your health history and family history, you may need to have cancer screening at various ages. This may include screening for:  Breast cancer.  Cervical cancer.  Colorectal cancer.  Skin cancer.  Lung cancer. What should I know about heart disease, diabetes, and high blood  pressure? Blood pressure and heart disease  High blood pressure causes heart disease and increases the risk of stroke. This is more likely to develop in people who have high blood pressure readings, are of African descent, or are overweight.  Have your blood pressure checked: ? Every 3-5 years if you are 18-39 years of age. ? Every year if you are 40 years old or older. Diabetes Have regular diabetes screenings. This checks your fasting blood sugar level. Have the screening done:  Once every three years after age 40 if you are at a normal weight and have a low risk for diabetes.  More often and at a younger age if you are overweight or have a high risk for diabetes. What should I know about preventing infection? Hepatitis B If you have a higher risk for hepatitis B, you should be screened for this virus. Talk with your health care provider to find out if you are at risk for hepatitis B infection. Hepatitis C Testing is recommended for:  Everyone born from 1945 through 1965.  Anyone with known risk factors for hepatitis C. Sexually transmitted infections (STIs)  Get screened for STIs, including gonorrhea and chlamydia, if: ? You are sexually active and are younger than 47 years of age. ? You are older than 47 years of age and your health care provider tells you that you are at risk for this type of infection. ? Your sexual activity has changed since you were last screened, and you are at increased risk for chlamydia or gonorrhea. Ask your health care provider if   you are at risk.  Ask your health care provider about whether you are at high risk for HIV. Your health care provider may recommend a prescription medicine to help prevent HIV infection. If you choose to take medicine to prevent HIV, you should first get tested for HIV. You should then be tested every 3 months for as long as you are taking the medicine. Pregnancy  If you are about to stop having your period (premenopausal) and  you may become pregnant, seek counseling before you get pregnant.  Take 400 to 800 micrograms (mcg) of folic acid every day if you become pregnant.  Ask for birth control (contraception) if you want to prevent pregnancy. Osteoporosis and menopause Osteoporosis is a disease in which the bones lose minerals and strength with aging. This can result in bone fractures. If you are 65 years old or older, or if you are at risk for osteoporosis and fractures, ask your health care provider if you should:  Be screened for bone loss.  Take a calcium or vitamin D supplement to lower your risk of fractures.  Be given hormone replacement therapy (HRT) to treat symptoms of menopause. Follow these instructions at home: Lifestyle  Do not use any products that contain nicotine or tobacco, such as cigarettes, e-cigarettes, and chewing tobacco. If you need help quitting, ask your health care provider.  Do not use street drugs.  Do not share needles.  Ask your health care provider for help if you need support or information about quitting drugs. Alcohol use  Do not drink alcohol if: ? Your health care provider tells you not to drink. ? You are pregnant, may be pregnant, or are planning to become pregnant.  If you drink alcohol: ? Limit how much you use to 0-1 drink a day. ? Limit intake if you are breastfeeding.  Be aware of how much alcohol is in your drink. In the [place] one drink equals one 12 oz bottle of beer (355 mL), one 5 oz glass of wine (148 mL), or one 1 oz glass of hard liquor (44 mL). General instructions  Schedule regular health, dental, and eye exams.  Stay current with your vaccines.  Tell your health care provider if: ? You often feel depressed. ? You have ever been abused or do not feel safe at home. Summary  Adopting a healthy lifestyle and getting preventive care are important in promoting health and wellness.  Follow your health care provider's instructions about healthy  diet, exercising, and getting tested or screened for diseases.  Follow your health care provider's instructions on monitoring your cholesterol and blood pressure. This information is not intended to replace advice given to you by your health care provider. Make sure you discuss any questions you have with your health care provider. Document Revised: 04/06/2018 Document Reviewed: 04/06/2018 [employer]vier Patient Education  2020 [employer]  

## 2019-08-03 NOTE — Progress Notes (Signed)
This visit occurred during the SARS-CoV-2 public health emergency.  Safety protocols were in place, including screening questions prior to the visit, additional usage of staff PPE, and extensive cleaning of exam room while observing appropriate contact time as indicated for disinfecting solutions.  Subjective:     Patient ID: Crystal Escobar , female    DOB: 1971/02/08 , 49 y.o.   MRN: [id number]   Chief Complaint  Patient presents with  . Annual Exam  . Diabetes  . Hypertension    HPI  She is here today for a full physical examination. She is followed by Dr. Vanessa Kick for her Gyn care. She was last seen in Nov 2020. She has no specific concerns or complaints at this time. She has not had any complications since her bariatric surgery February 2021.   Diabetes She presents for her follow-up diabetic visit. She has type 2 diabetes mellitus. There are no hypoglycemic associated symptoms. There are no diabetic associated symptoms. Pertinent negatives for diabetes include no blurred vision and no chest pain. There are no hypoglycemic complications. Risk factors for coronary artery disease include diabetes mellitus, dyslipidemia, hypertension, obesity and sedentary lifestyle. When asked about current treatments, none were reported. She is compliant with treatment some of the time. She is following a generally healthy diet. She participates in exercise intermittently. An ACE inhibitor/angiotensin II receptor blocker is being taken.  Hypertension This is a chronic problem. The current episode started more than 1 year ago. The problem has been gradually improving since onset. The problem is uncontrolled. Pertinent negatives include no blurred vision, chest pain, palpitations or shortness of breath.     Past Medical History:  Diagnosis Date  . Abnormal Pap smear 2011  . Diabetes mellitus   . DUB (dysfunctional uterine bleeding) 12/22/2012  . HLD (hyperlipidemia)   . Hypertension      Family  History  Problem Relation Age of Onset  . Seizures Father   . Heart attack Mother   . Hypertension Mother   . Diabetes Mother   . Hypertension Sister   . Heart murmur Son   . Sickle cell anemia Maternal Aunt   . Hypertension Maternal Aunt      Current Outpatient Medications:  .  Boswellia-Glucosamine-Vit D (OSTEO BI-FLEX ONE PER DAY PO), Take by mouth., Disp: , Rfl:  .  cetirizine (ZYRTEC) 10 MG tablet, Take 10 mg by mouth as needed. , Disp: , Rfl:  .  Cholecalciferol (VITAMIN D3) 125 MCG (5000 UT) CAPS, Take by mouth. 2 per day, Disp: , Rfl:  .  losartan (COZAAR) 25 MG tablet, Take 25 mg by mouth daily., Disp: , Rfl:  .  medroxyPROGESTERone (PROVERA) 10 MG tablet, Take 10 mg by mouth daily. Take on 1 tablet daily on DAY 1-10 of each month, Disp: , Rfl:  .  Multiple Vitamin (MULTIVITAMIN) capsule, Take 1 capsule by mouth daily., Disp: , Rfl:  .  pantoprazole (PROTONIX) 40 MG tablet, Take 40 mg by mouth daily., Disp: , Rfl:  .  Probiotic Product (PROBIOTIC PO), Take by mouth., Disp: , Rfl:  .  traMADol (ULTRAM) 50 MG tablet, TAKE 1 TABLET BY MOUTH EVERY 6 HOURS AS NEEDED, Disp: 30 tablet, Rfl: 0 .  atorvastatin (LIPITOR) 20 MG tablet, Take 20 mg by mouth daily., Disp: , Rfl: 5 .  fluconazole (DIFLUCAN) 150 MG tablet, Take one tab po today, repeat in 48 hours (Patient not taking: Reported on 08/03/2019), Disp: 2 tablet, Rfl: 1   No Known  Allergies    The patient states she uses none for birth control. Last LMP was Patient's last menstrual period was 06/09/2019 (approximate).. Negative for Dysmenorrhea Negative for: breast discharge, breast lump(s), breast pain and breast self exam. Associated symptoms include abnormal vaginal bleeding. Pertinent negatives include abnormal bleeding (hematology), anxiety, decreased libido, depression, difficulty falling sleep, dyspareunia, history of infertility, nocturia, sexual dysfunction, sleep disturbances, urinary incontinence, urinary urgency, vaginal  discharge and vaginal itching. Diet regular.The patient states her exercise level is  intermittent.   . The patient's tobacco use is:  Social History   Tobacco Use  Smoking Status Former Smoker  . Packs/day: 1.00  . Years: 10.00  . Pack years: 10.00  . Types: Cigars  . Quit date: 06/03/2018  . Years since quitting: 1.1  Smokeless Tobacco Never Used  Tobacco Comment   1 cigar a day  . She has been exposed to passive smoke. The patient's alcohol use is:  Social History   Substance and Sexual Activity  Alcohol Use Yes   Comment: occasionally     Review of Systems  Constitutional: Negative.   HENT: Negative.   Eyes: Negative.  Negative for blurred vision.  Respiratory: Negative.  Negative for shortness of breath.   Cardiovascular: Negative.  Negative for chest pain and palpitations.  Endocrine: Negative.   Genitourinary: Negative.   Musculoskeletal: Negative.   Skin: Negative.   Allergic/Immunologic: Negative.   Neurological: Negative.   Hematological: Negative.   Psychiatric/Behavioral: Negative.       The patient states she uses none for birth control. Last LMP was Patient's last menstrual period was 06/09/2019 (approximate).. Negative for Dysmenorrhea  Negative for: breast discharge, breast lump(s), breast pain and breast self exam. Associated symptoms include abnormal vaginal bleeding. Pertinent negatives include abnormal bleeding (hematology), anxiety, decreased libido, depression, difficulty falling sleep, dyspareunia, history of infertility, nocturia, sexual dysfunction, sleep disturbances, urinary incontinence, urinary urgency, vaginal discharge and vaginal itching. Diet regular.The patient states her exercise level is    . The patient's tobacco use is:  Social History   Tobacco Use  Smoking Status Former Smoker  . Packs/day: 1.00  . Years: 10.00  . Pack years: 10.00  . Types: Cigars  . Quit date: 06/03/2018  . Years since quitting: 1.1  Smokeless Tobacco Never  Used  Tobacco Comment   1 cigar a day  . She has been exposed to passive smoke. The patient's alcohol use is:  Social History   Substance and Sexual Activity  Alcohol Use Yes   Comment: occasionally    Today's Vitals   08/03/19 1018  BP: 128/74  Pulse: 88  Temp: (!) 97.3 F (36.3 C)  TempSrc: Oral  Weight: (!) 318 lb 9.6 oz (144.5 kg)  Height: 5' 2.6" (1.59 m)   Body mass index is 57.16 kg/m.   Wt Readings from Last 3 Encounters:  08/03/19 (!) 318 lb 9.6 oz (144.5 kg)  07/04/19 (!) 329 lb 3.2 oz (149.3 kg)  05/01/19 (!) 343 lb (155.6 kg)     Objective:  Physical Exam Vitals and nursing note reviewed.  Constitutional:      Appearance: Normal appearance. She is obese.  HENT:     Head: Normocephalic and atraumatic.     Right Ear: Tympanic membrane, ear canal and external ear normal.     Left Ear: Tympanic membrane, ear canal and external ear normal.     Nose:     Comments: Deferred, masked    Mouth/Throat:     Comments:  Deferred, masked Eyes:     Extraocular Movements: Extraocular movements intact.     Conjunctiva/sclera: Conjunctivae normal.     Pupils: Pupils are equal, round, and reactive to light.  Cardiovascular:     Rate and Rhythm: Normal rate and regular rhythm.     Pulses: Normal pulses.          Dorsalis pedis pulses are 2+ on the right side and 2+ on the left side.       Posterior tibial pulses are 2+ on the right side and 2+ on the left side.     Heart sounds: Normal heart sounds.  Pulmonary:     Effort: Pulmonary effort is normal.     Breath sounds: Normal breath sounds.  Chest:     Breasts: Tanner Score is 5.        Right: Normal.        Left: Normal.  Abdominal:     General: Bowel sounds are normal.     Palpations: Abdomen is soft.     Comments: Obese, soft. Difficult to assess organomegaly.   Genitourinary:    Comments: deferred Musculoskeletal:        General: Normal range of motion.     Cervical back: Normal range of motion and neck  supple.  Feet:     Right foot:     Protective Sensation: 5 sites tested. 5 sites sensed.     Skin integrity: Skin integrity normal.     Toenail Condition: Right toenails are normal.     Left foot:     Protective Sensation: 5 sites tested. 5 sites sensed.     Skin integrity: Skin integrity normal.     Toenail Condition: Left toenails are normal.  Skin:    General: Skin is warm and dry.  Neurological:     General: No focal deficit present.     Mental Status: She is alert and oriented to person, place, and time.  Psychiatric:        Mood and Affect: Mood normal.        Behavior: Behavior normal.         Assessment And Plan:   1. Routine general medical examination at health care facility  A full exam was performed.  Importance of monthly self breast exams was discussed with the patient.  PATIENT IS ADVISED TO GET 30-45 MINUTES REGULAR EXERCISE NO LESS THAN FOUR TO FIVE DAYS PER WEEK - BOTH WEIGHTBEARING EXERCISES AND AEROBIC ARE RECOMMENDED.  SHE IS ADVISED TO FOLLOW A HEALTHY DIET WITH AT LEAST SIX FRUITS/VEGGIES PER DAY, DECREASE INTAKE OF RED MEAT, AND TO INCREASE FISH INTAKE TO TWO DAYS PER WEEK.  MEATS/FISH SHOULD NOT BE FRIED, BAKED OR BROILED IS PREFERABLE.  I SUGGEST WEARING SPF 50 SUNSCREEN ON EXPOSED PARTS AND ESPECIALLY WHEN IN THE DIRECT SUNLIGHT FOR AN EXTENDED PERIOD OF TIME.  PLEASE AVOID FAST FOOD RESTAURANTS AND INCREASE YOUR WATER INTAKE.  - CMP14+EGFR - Lipid panel - Vitamin B12 - Hemoglobin A1c  2. Uncontrolled type 2 diabetes mellitus with hyperglycemia (HCC)  Diabetic foot exam was performed. I DISCUSSED WITH THE PATIENT AT LENGTH REGARDING THE GOALS OF GLYCEMIC CONTROL AND POSSIBLE LONG-TERM COMPLICATIONS.  I  ALSO STRESSED THE IMPORTANCE OF COMPLIANCE WITH HOME GLUCOSE MONITORING, DIETARY RESTRICTIONS INCLUDING AVOIDANCE OF SUGARY DRINKS/PROCESSED FOODS,  ALONG WITH REGULAR EXERCISE.  I  ALSO STRESSED THE IMPORTANCE OF ANNUAL EYE EXAMS, SELF FOOT CARE AND  COMPLIANCE WITH OFFICE VISITS.  - POCT Urinalysis Dipstick (81002) -  POCT UA - Microalbumin - EKG 12-Lead  3. HYPERTENSION, BENIGN ESSENTIAL  Chronic, well controlled. She will continue with current meds. She is encouraged to avoid adding salt to her foods. EKG performed, NSR w/o acute changes. She will rto in six months for re-evaluation.   - POCT Urinalysis Dipstick (81002) - POCT UA - Microalbumin - EKG 12-Lead  4. Obstructive sleep apnea  Chronic. Importance of treatment to decrease risk of heart failure, stroke, arrhythmia and worsening HTN. She is encouraged to use CPAP at least five hours per night. If mask continues to be uncomfortable, she is advised to contact DME company to get new mask options to try.    [name], MD    THE PATIENT IS ENCOURAGED TO PRACTICE SOCIAL DISTANCING DUE TO THE COVID-19 PANDEMIC.

## 2019-08-04 LAB — CMP14+EGFR
ALT: 15 IU/L (ref 0–32)
AST: 14 IU/L (ref 0–40)
Albumin/Globulin Ratio: 1.1 — ABNORMAL LOW (ref 1.2–2.2)
Albumin: 3.8 g/dL (ref 3.8–4.8)
Alkaline Phosphatase: 104 IU/L (ref 39–117)
BUN/Creatinine Ratio: 20 (ref 9–23)
BUN: 11 mg/dL (ref 6–24)
Bilirubin Total: 0.3 mg/dL (ref 0.0–1.2)
CO2: 19 mmol/L — ABNORMAL LOW (ref 20–29)
Calcium: 9.3 mg/dL (ref 8.7–10.2)
Chloride: 109 mmol/L — ABNORMAL HIGH (ref 96–106)
Creatinine, Ser: 0.56 mg/dL — ABNORMAL LOW (ref 0.57–1.00)
GFR calc Af Amer: 129 mL/min/{1.73_m2} (ref >59)
GFR calc non Af Amer: 112 mL/min/{1.73_m2} (ref >59)
Globulin, Total: 3.4 g/dL (ref 1.5–4.5)
Glucose: 98 mg/dL (ref 65–99)
Potassium: 4.4 mmol/L (ref 3.5–5.2)
Sodium: 140 mmol/L (ref 134–144)
Total Protein: 7.2 g/dL (ref 6.0–8.5)

## 2019-08-04 LAB — LIPID PANEL
Chol/HDL Ratio: 3.2 ratio (ref 0.0–4.4)
Cholesterol, Total: 191 mg/dL (ref 100–199)
HDL: 59 mg/dL (ref >39)
LDL Chol Calc (NIH): 116 mg/dL — ABNORMAL HIGH (ref 0–99)
Triglycerides: 88 mg/dL (ref 0–149)
VLDL Cholesterol Cal: 16 mg/dL (ref 5–40)

## 2019-08-04 LAB — HEMOGLOBIN A1C
Est. average glucose Bld gHb Est-mCnc: 143 mg/dL
Hgb A1c MFr Bld: 6.6 % — ABNORMAL HIGH (ref 4.8–5.6)

## 2019-08-04 LAB — VITAMIN B12: Vitamin B-12: 1044 pg/mL (ref 232–1245)

## 2019-08-06 ENCOUNTER — Encounter: Payer: Self-pay | Admitting: Internal Medicine

## 2019-08-08 ENCOUNTER — Other Ambulatory Visit: Payer: Self-pay

## 2019-08-08 MED ORDER — ATORVASTATIN CALCIUM 20 MG PO TABS: 20.0000 mg | ORAL_TABLET | Freq: Every day | ORAL | 1 refills | Status: DC

## 2019-09-05 ENCOUNTER — Other Ambulatory Visit: Payer: Self-pay | Admitting: Internal Medicine

## 2019-10-03 ENCOUNTER — Encounter: Payer: Self-pay | Admitting: Internal Medicine

## 2019-11-08 ENCOUNTER — Ambulatory Visit: Payer: BC Managed Care – PPO | Admitting: Internal Medicine

## 2019-11-13 ENCOUNTER — Encounter: Payer: Self-pay | Admitting: Internal Medicine

## 2019-11-13 ENCOUNTER — Other Ambulatory Visit: Payer: Self-pay

## 2019-11-13 ENCOUNTER — Ambulatory Visit: Payer: BC Managed Care – PPO | Admitting: Internal Medicine

## 2019-11-13 ENCOUNTER — Other Ambulatory Visit: Payer: Self-pay | Admitting: Internal Medicine

## 2019-11-13 VITALS — BP 128/80 | HR 74 | Temp 98.0°F | Ht 62.2 in | Wt 311.0 lb

## 2019-11-13 DIAGNOSIS — E1165 Type 2 diabetes mellitus with hyperglycemia: Secondary | ICD-10-CM | POA: Diagnosis not present

## 2019-11-13 DIAGNOSIS — Z6841 Body Mass Index (BMI) 40.0 and over, adult: Secondary | ICD-10-CM

## 2019-11-13 DIAGNOSIS — I10 Essential (primary) hypertension: Secondary | ICD-10-CM

## 2019-11-13 DIAGNOSIS — Z9884 Bariatric surgery status: Secondary | ICD-10-CM | POA: Diagnosis not present

## 2019-11-13 NOTE — Patient Instructions (Signed)
Diabetes Mellitus and Exercise Exercising regularly is important for your overall health, especially when you have diabetes (diabetes mellitus). Exercising is not only about losing weight. It has many other health benefits, such as increasing muscle strength and bone density and reducing body fat and stress. This leads to improved fitness, flexibility, and endurance, all of which result in better overall health. Exercise has additional benefits for people with diabetes, including:  Reducing appetite.  Helping to lower and control blood glucose.  Lowering blood pressure.  Helping to control amounts of fatty substances (lipids) in the blood, such as cholesterol and triglycerides.  Helping the body to respond better to insulin (improving insulin sensitivity).  Reducing how much insulin the body needs.  Decreasing the risk for heart disease by: ? Lowering cholesterol and triglyceride levels. ? Increasing the levels of good cholesterol. ? Lowering blood glucose levels. What is my activity plan? Your health care provider or certified diabetes educator can help you make a plan for the type and frequency of exercise (activity plan) that works for you. Make sure that you:  Do at least 150 minutes of moderate-intensity or vigorous-intensity exercise each week. This could be brisk walking, biking, or water aerobics. ? Do stretching and strength exercises, such as yoga or weightlifting, at least 2 times a week. ? Spread out your activity over at least 3 days of the week.  Get some form of physical activity every day. ? Do not go more than 2 days in a row without some kind of physical activity. ? Avoid being inactive for more than 30 minutes at a time. Take frequent breaks to walk or stretch.  Choose a type of exercise or activity that you enjoy, and set realistic goals.  Start slowly, and gradually increase the intensity of your exercise over time. What do I need to know about managing my  diabetes?   Check your blood glucose before and after exercising. ? If your blood glucose is 240 mg/dL (13.3 mmol/L) or higher before you exercise, check your urine for ketones. If you have ketones in your urine, do not exercise until your blood glucose returns to normal. ? If your blood glucose is 100 mg/dL (5.6 mmol/L) or lower, eat a snack containing 15-20 grams of carbohydrate. Check your blood glucose 15 minutes after the snack to make sure that your level is above 100 mg/dL (5.6 mmol/L) before you start your exercise.  Know the symptoms of low blood glucose (hypoglycemia) and how to treat it. Your risk for hypoglycemia increases during and after exercise. Common symptoms of hypoglycemia can include: ? Hunger. ? Anxiety. ? Sweating and feeling clammy. ? Confusion. ? Dizziness or feeling light-headed. ? Increased heart rate or palpitations. ? Blurry vision. ? Tingling or numbness around the mouth, lips, or tongue. ? Tremors or shakes. ? Irritability.  Keep a rapid-acting carbohydrate snack available before, during, and after exercise to help prevent or treat hypoglycemia.  Avoid injecting insulin into areas of the body that are going to be exercised. For example, avoid injecting insulin into: ? The arms, when playing tennis. ? The legs, when jogging.  Keep records of your exercise habits. Doing this can help you and your health care provider adjust your diabetes management plan as needed. Write down: ? Food that you eat before and after you exercise. ? Blood glucose levels before and after you exercise. ? The type and amount of exercise you have done. ? When your insulin is expected to peak, if you use   insulin. Avoid exercising at times when your insulin is peaking.  When you start a new exercise or activity, work with your health care provider to make sure the activity is safe for you, and to adjust your insulin, medicines, or food intake as needed.  Drink plenty of water while  you exercise to prevent dehydration or heat stroke. Drink enough fluid to keep your urine clear or pale yellow. Summary  Exercising regularly is important for your overall health, especially when you have diabetes (diabetes mellitus).  Exercising has many health benefits, such as increasing muscle strength and bone density and reducing body fat and stress.  Your health care provider or certified diabetes educator can help you make a plan for the type and frequency of exercise (activity plan) that works for you.  When you start a new exercise or activity, work with your health care provider to make sure the activity is safe for you, and to adjust your insulin, medicines, or food intake as needed. This information is not intended to replace advice given to you by your health care provider. Make sure you discuss any questions you have with your health care provider. Document Revised: 11/05/2016 Document Reviewed: 09/23/2015 Elsevier Patient Education  2020 [employer]  

## 2019-11-13 NOTE — Progress Notes (Signed)
This visit occurred during the SARS-CoV-2 public health emergency.  Safety protocols were in place, including screening questions prior to the visit, additional usage of staff PPE, and extensive cleaning of exam room while observing appropriate contact time as indicated for disinfecting solutions.  Subjective:     Patient ID: Crystal Escobar , female    DOB: 1971/02/10 , 49 y.o.   MRN: [id number]   Chief Complaint  Patient presents with  . Diabetes  . Hypertension    HPI  Diabetes She presents for her follow-up diabetic visit. She has type 2 diabetes mellitus. There are no hypoglycemic associated symptoms. There are no diabetic associated symptoms. Pertinent negatives for diabetes include no blurred vision. There are no hypoglycemic complications. Risk factors for coronary artery disease include diabetes mellitus, dyslipidemia, hypertension, obesity and sedentary lifestyle. When asked about current treatments, none were reported. She is compliant with treatment some of the time. She is following a generally healthy diet. She participates in exercise intermittently. An ACE inhibitor/angiotensin II receptor blocker is being taken.  Hypertension This is a chronic problem. The current episode started more than 1 year ago. The problem has been gradually improving since onset. The problem is uncontrolled. Pertinent negatives include no blurred vision. Risk factors for coronary artery disease include obesity, sedentary lifestyle and diabetes mellitus. Past treatments include angiotensin blockers. The current treatment provides moderate improvement.     Past Medical History:  Diagnosis Date  . Abnormal Pap smear 2011  . Diabetes mellitus   . DUB (dysfunctional uterine bleeding) 12/22/2012  . HLD (hyperlipidemia)   . Hypertension      Family History  Problem Relation Age of Onset  . Seizures Father   . Heart attack Mother   . Hypertension Mother   . Diabetes Mother   . Hypertension Sister    . Heart murmur Son   . Sickle cell anemia Maternal Aunt   . Hypertension Maternal Aunt      Current Outpatient Medications:  .  atorvastatin (LIPITOR) 20 MG tablet, Take 1 tablet (20 mg total) by mouth daily., Disp: 90 tablet, Rfl: 1 .  Boswellia-Glucosamine-Vit D (OSTEO BI-FLEX ONE PER DAY PO), Take by mouth., Disp: , Rfl:  .  cetirizine (ZYRTEC) 10 MG tablet, Take 10 mg by mouth as needed. , Disp: , Rfl:  .  Cholecalciferol (VITAMIN D3) 125 MCG (5000 UT) CAPS, Take by mouth. 2 per day, Disp: , Rfl:  .  losartan (COZAAR) 25 MG tablet, Take 25 mg by mouth daily., Disp: , Rfl:  .  losartan (COZAAR) 50 MG tablet, Take 1 tablet by mouth once daily, Disp: 90 tablet, Rfl: 0 .  medroxyPROGESTERone (PROVERA) 10 MG tablet, Take 10 mg by mouth daily. Take on 1 tablet daily on DAY 1-10 of each month, Disp: , Rfl:  .  Multiple Vitamin (MULTIVITAMIN) capsule, Take 1 capsule by mouth daily., Disp: , Rfl:  .  pantoprazole (PROTONIX) 40 MG tablet, Take 40 mg by mouth daily., Disp: , Rfl:  .  Probiotic Product (PROBIOTIC PO), Take by mouth., Disp: , Rfl:  .  Semaglutide (RYBELSUS) 3 MG TABS, Take by mouth., Disp: , Rfl:  .  traMADol (ULTRAM) 50 MG tablet, TAKE 1 TABLET BY MOUTH EVERY 6 HOURS AS NEEDED, Disp: 30 tablet, Rfl: 0   No Known Allergies   Review of Systems  Constitutional: Negative.   Eyes: Negative for blurred vision.  Respiratory: Negative.   Cardiovascular: Negative.   Gastrointestinal: Negative.   Neurological: Negative.  Today's Vitals   11/13/19 1419  BP: 128/80  Pulse: 74  Temp: 98 F (36.7 C)  TempSrc: Oral  Weight: (!) 311 lb (141.1 kg)  Height: 5' 2.2" (1.58 m)   Body mass index is 56.52 kg/m.   Wt Readings from Last 3 Encounters:  11/13/19 (!) 311 lb (141.1 kg)  08/03/19 (!) 318 lb 9.6 oz (144.5 kg)  07/04/19 (!) 329 lb 3.2 oz (149.3 kg)    Objective:  Physical Exam Vitals and nursing note reviewed.  Constitutional:      Appearance: Normal appearance.  She is obese.  HENT:     Head: Normocephalic and atraumatic.  Cardiovascular:     Rate and Rhythm: Normal rate and regular rhythm.     Heart sounds: Normal heart sounds.  Pulmonary:     Effort: Pulmonary effort is normal.     Breath sounds: Normal breath sounds.  Skin:    General: Skin is warm.  Neurological:     General: No focal deficit present.     Mental Status: She is alert.  Psychiatric:        Mood and Affect: Mood normal.        Behavior: Behavior normal.         Assessment And Plan:     1. Uncontrolled type 2 diabetes mellitus with hyperglycemia (HCC)  Chronic, I will check labs as listed below. I will adjust meds as needed.  Importance of regular exercise was discussed with the patient.   - Hemoglobin A1c - BMP8+EGFR  2. HYPERTENSION, BENIGN ESSENTIAL  Chronic, well controlled. She will continue with current meds. She is encouraged to avoid adding salt to her foods.    3. Class 3 severe obesity due to excess calories with serious comorbidity and body mass index (BMI) of 50.0 to 59.9 in adult (HCC)  BMI 56.  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 She has lost 7 pounds since April. She is encouraged to incorporate more exercise into her daily routine. She is advised to aim for at least 30 minutes five days per week.   4. Status post bariatric surgery    Patient was given opportunity to ask questions. Patient verbalized understanding of the plan and was able to repeat key elements of the plan. All questions were answered to their satisfaction.   [name], MD   I, [name], MD, have reviewed all documentation for this visit. The documentation on 11/13/19 for the exam, diagnosis, procedures, and orders are all accurate and complete.  THE PATIENT IS ENCOURAGED TO PRACTICE SOCIAL DISTANCING DUE TO THE COVID-19 PANDEMIC.

## 2019-11-14 ENCOUNTER — Other Ambulatory Visit: Payer: Self-pay

## 2019-11-14 ENCOUNTER — Telehealth: Payer: Self-pay

## 2019-11-14 ENCOUNTER — Encounter: Payer: Self-pay | Admitting: Internal Medicine

## 2019-11-14 LAB — BMP8+EGFR
BUN/Creatinine Ratio: 19 (ref 9–23)
BUN: 12 mg/dL (ref 6–24)
CO2: 23 mmol/L (ref 20–29)
Calcium: 9.8 mg/dL (ref 8.7–10.2)
Chloride: 105 mmol/L (ref 96–106)
Creatinine, Ser: 0.62 mg/dL (ref 0.57–1.00)
GFR calc Af Amer: 125 mL/min/{1.73_m2} (ref >59)
GFR calc non Af Amer: 108 mL/min/{1.73_m2} (ref >59)
Glucose: 87 mg/dL (ref 65–99)
Potassium: 4.4 mmol/L (ref 3.5–5.2)
Sodium: 142 mmol/L (ref 134–144)

## 2019-11-14 LAB — HEMOGLOBIN A1C
Est. average glucose Bld gHb Est-mCnc: 128 mg/dL
Hgb A1c MFr Bld: 6.1 % — ABNORMAL HIGH (ref 4.8–5.6)

## 2019-11-14 NOTE — Telephone Encounter (Signed)
Looked at dispense history. 

## 2019-11-14 NOTE — Progress Notes (Signed)
Pt sent message to inform provider that she is taking 50mg  of losartan

## 2019-11-16 LAB — HM DIABETES EYE EXAM

## 2020-01-14 ENCOUNTER — Other Ambulatory Visit: Payer: Self-pay | Admitting: Internal Medicine

## 2020-01-17 ENCOUNTER — Other Ambulatory Visit: Payer: Self-pay | Admitting: Internal Medicine

## 2020-01-17 NOTE — Telephone Encounter (Signed)
Ultram refill  

## 2020-01-26 ENCOUNTER — Other Ambulatory Visit: Payer: Self-pay

## 2020-01-26 ENCOUNTER — Ambulatory Visit
Admission: RE | Admit: 2020-01-26 | Discharge: 2020-01-26 | Disposition: A | Discharge status: Home / Self Care | Payer: BC Managed Care – PPO | Source: Ambulatory Visit | Attending: Emergency Medicine | Admitting: Emergency Medicine

## 2020-01-26 VITALS — BP 150/89 | HR 72 | Temp 98.2°F | Resp 20

## 2020-01-26 DIAGNOSIS — M25522 Pain in left elbow: Secondary | ICD-10-CM | POA: Diagnosis not present

## 2020-01-26 DIAGNOSIS — M778 Other enthesopathies, not elsewhere classified: Secondary | ICD-10-CM

## 2020-01-26 MED ORDER — PREDNISONE 20 MG PO TABS
20.0000 mg | ORAL_TABLET | Freq: Two times a day (BID) | ORAL | 0 refills | Status: AC
Start: 2020-01-26 — End: 2020-01-31

## 2020-01-26 NOTE — ED Triage Notes (Signed)
Pt presents with left elbow pain that began 3 days ago, denies injury, pain radiates to hand

## 2020-01-26 NOTE — ED Provider Notes (Signed)
[facility]   [id number] 01/26/20 Arrival Time: 5809  CC: LT elbow PAIN  SUBJECTIVE: History from: patient. Crystal Escobar is a 49 y.o. female complains of LT elbow pain x 3 days.  Denies a precipitating event or specific injury.  Localizes the pain to the back of LT elbow.  Describes the pain as intermittent and burning in character.  Has tried OTC medications without relief.  Denies aggravating factors.  Denies similar symptoms in the past.  Complains of numbness and tingling into LT arm.  Denies fever, chills, erythema, ecchymosis, effusion, weakness.  ROS: As per HPI.  All other pertinent ROS negative.     Past Medical History:  Diagnosis Date  . Abnormal Pap smear 2011  . Diabetes mellitus   . DUB (dysfunctional uterine bleeding) 12/22/2012  . HLD (hyperlipidemia)   . Hypertension    Past Surgical History:  Procedure Laterality Date  . CARPAL TUNNEL RELEASE Right 2016  . CESAREAN SECTION    . GASTRIC BYPASS  2021  . KNEE SURGERY    . TUBAL LIGATION     No Known Allergies No current facility-administered medications on file prior to encounter.   Current Outpatient Medications on File Prior to Encounter  Medication Sig Dispense Refill  . atorvastatin (LIPITOR) 20 MG tablet Take 1 tablet by mouth once daily 30 tablet 0  . Boswellia-Glucosamine-Vit D (OSTEO BI-FLEX ONE PER DAY PO) Take by mouth.    . cetirizine (ZYRTEC) 10 MG tablet Take 10 mg by mouth as needed.     . Cholecalciferol (VITAMIN D3) 125 MCG (5000 UT) CAPS Take by mouth. 2 per day    . losartan (COZAAR) 50 MG tablet Take 1 tablet by mouth once daily 90 tablet 0  . medroxyPROGESTERone (PROVERA) 10 MG tablet Take 10 mg by mouth daily. Take on 1 tablet daily on DAY 1-10 of each month    . Multiple Vitamin (MULTIVITAMIN) capsule Take 1 capsule by mouth daily.    . pantoprazole (PROTONIX) 40 MG tablet Take 40 mg by mouth daily.    . Probiotic Product (PROBIOTIC PO) Take by mouth.    . Semaglutide  (RYBELSUS) 3 MG TABS Take by mouth.    . traMADol (ULTRAM) 50 MG tablet TAKE 1 TABLET BY MOUTH EVERY 6 HOURS AS NEEDED 30 tablet 0   Social History   Socioeconomic History  . Marital status: Married    Spouse name: Not on file  . Number of children: Not on file  . Years of education: Not on file  . Highest education level: Not on file  Occupational History  . Not on file  Tobacco Use  . Smoking status: Former Smoker    Packs/day: 1.00    Years: 10.00    Pack years: 10.00    Types: Cigars    Quit date: 06/03/2018    Years since quitting: 1.6  . Smokeless tobacco: Never Used  . Tobacco comment: 1 cigar a day  Vaping Use  . Vaping Use: Never used  Substance and Sexual Activity  . Alcohol use: Yes    Comment: occasionally   . Drug use: No  . Sexual activity: Yes    Partners: Male    Birth control/protection: Surgical    Comment: BTL  Other Topics Concern  . Not on file  Social History Narrative  . Not on file   Social Determinants of Health   Financial Resource Strain:   . Difficulty of Paying Living Expenses: Not on file  Food Insecurity:   . Worried About Charity fundraiser in the Last Year: Not on file  . Ran Out of Food in the Last Year: Not on file  Transportation Needs:   . Lack of Transportation (Medical): Not on file  . Lack of Transportation (Non-Medical): Not on file  Physical Activity:   . Days of Exercise per Week: Not on file  . Minutes of Exercise per Session: Not on file  Stress:   . Feeling of Stress : Not on file  Social Connections:   . Frequency of Communication with Friends and Family: Not on file  . Frequency of Social Gatherings with Friends and Family: Not on file  . Attends Religious Services: Not on file  . Active Member of Clubs or Organizations: Not on file  . Attends Archivist Meetings: Not on file  . Marital Status: Not on file  Intimate Partner Violence:   . Fear of Current or Ex-Partner: Not on file  . Emotionally  Abused: Not on file  . Physically Abused: Not on file  . Sexually Abused: Not on file   Family History  Problem Relation Age of Onset  . Seizures Father   . Heart attack Mother   . Hypertension Mother   . Diabetes Mother   . Hypertension Sister   . Heart murmur Son   . Sickle cell anemia Maternal Aunt   . Hypertension Maternal Aunt     OBJECTIVE:  Vitals:   01/26/20 1801  BP: (!) 150/89  Pulse: 72  Resp: 20  Temp: 98.2 F (36.8 C)  SpO2: 95%    General appearance: ALERT; in no acute distress.  Head: NCAT Lungs: Normal respiratory effort CV: Radial pulse 2+ Musculoskeletal: LT elbow Inspection: Skin warm, dry, clear and intact without obvious erythema, effusion, or ecchymosis.  Palpation: mildly TTP over superior posterior elbow ROM: FROM active and passive Strength: 5/5 elbow flexion, 5/5 elbow extension, 5/5 grip strength Skin: warm and dry Neurologic: Ambulates without difficulty Psychological: alert and cooperative; normal mood and affect  ASSESSMENT & PLAN:  1. Left elbow pain   2. Elbow tendinitis     Meds ordered this encounter  Medications  . predniSONE (DELTASONE) 20 MG tablet    Sig: Take 1 tablet (20 mg total) by mouth 2 (two) times daily with a meal for 5 days.    Dispense:  10 tablet    Refill:  0    Order Specific Question:   Supervising Provider    Answer:   Raylene Everts [[id number]]    Continue conservative management of rest, ice, and gentle stretches Prednisone prescribed.  Take as directed and to completion Follow up with orthopedist if symptoms persist Return or go to the ER if you have any new or worsening symptoms (fever, chills, chest pain, redness, swelling, deformity, bruising, etc...)   Reviewed expectations re: course of current medical issues. Questions answered. Outlined signs and symptoms indicating need for more acute intervention. Patient verbalized understanding. After Visit Summary given.    Lestine Box,  PA-C 01/26/20 1838

## 2020-01-26 NOTE — Discharge Instructions (Signed)
Continue conservative management of rest, ice, and gentle stretches Prednisone prescribed.  Take as directed and to completion Follow up with orthopedist if symptoms persist Return or go to the ER if you have any new or worsening symptoms (fever, chills, chest pain, redness, swelling, deformity, bruising, etc...)

## 2020-02-13 ENCOUNTER — Ambulatory Visit: Payer: BC Managed Care – PPO | Admitting: Internal Medicine

## 2020-02-13 ENCOUNTER — Encounter: Payer: Self-pay | Admitting: Internal Medicine

## 2020-02-13 ENCOUNTER — Other Ambulatory Visit: Payer: Self-pay

## 2020-02-13 VITALS — BP 132/74 | HR 86 | Temp 98.2°F | Ht 62.2 in | Wt 305.4 lb

## 2020-02-13 DIAGNOSIS — I10 Essential (primary) hypertension: Secondary | ICD-10-CM | POA: Diagnosis not present

## 2020-02-13 DIAGNOSIS — Z23 Encounter for immunization: Secondary | ICD-10-CM | POA: Diagnosis not present

## 2020-02-13 DIAGNOSIS — E1165 Type 2 diabetes mellitus with hyperglycemia: Secondary | ICD-10-CM | POA: Diagnosis not present

## 2020-02-13 DIAGNOSIS — Z6841 Body Mass Index (BMI) 40.0 and over, adult: Secondary | ICD-10-CM

## 2020-02-13 DIAGNOSIS — N946 Dysmenorrhea, unspecified: Secondary | ICD-10-CM

## 2020-02-13 NOTE — Patient Instructions (Signed)
Diabetes Mellitus and Exercise Exercising regularly is important for your overall health, especially when you have diabetes (diabetes mellitus). Exercising is not only about losing weight. It has many other health benefits, such as increasing muscle strength and bone density and reducing body fat and stress. This leads to improved fitness, flexibility, and endurance, all of which result in better overall health. Exercise has additional benefits for people with diabetes, including:  Reducing appetite.  Helping to lower and control blood glucose.  Lowering blood pressure.  Helping to control amounts of fatty substances (lipids) in the blood, such as cholesterol and triglycerides.  Helping the body to respond better to insulin (improving insulin sensitivity).  Reducing how much insulin the body needs.  Decreasing the risk for heart disease by: ? Lowering cholesterol and triglyceride levels. ? Increasing the levels of good cholesterol. ? Lowering blood glucose levels. What is my activity plan? Your health care provider or certified diabetes educator can help you make a plan for the type and frequency of exercise (activity plan) that works for you. Make sure that you:  Do at least 150 minutes of moderate-intensity or vigorous-intensity exercise each week. This could be brisk walking, biking, or water aerobics. ? Do stretching and strength exercises, such as yoga or weightlifting, at least 2 times a week. ? Spread out your activity over at least 3 days of the week.  Get some form of physical activity every day. ? Do not go more than 2 days in a row without some kind of physical activity. ? Avoid being inactive for more than 30 minutes at a time. Take frequent breaks to walk or stretch.  Choose a type of exercise or activity that you enjoy, and set realistic goals.  Start slowly, and gradually increase the intensity of your exercise over time. What do I need to know about managing my  diabetes?   Check your blood glucose before and after exercising. ? If your blood glucose is 240 mg/dL (13.3 mmol/L) or higher before you exercise, check your urine for ketones. If you have ketones in your urine, do not exercise until your blood glucose returns to normal. ? If your blood glucose is 100 mg/dL (5.6 mmol/L) or lower, eat a snack containing 15-20 grams of carbohydrate. Check your blood glucose 15 minutes after the snack to make sure that your level is above 100 mg/dL (5.6 mmol/L) before you start your exercise.  Know the symptoms of low blood glucose (hypoglycemia) and how to treat it. Your risk for hypoglycemia increases during and after exercise. Common symptoms of hypoglycemia can include: ? Hunger. ? Anxiety. ? Sweating and feeling clammy. ? Confusion. ? Dizziness or feeling light-headed. ? Increased heart rate or palpitations. ? Blurry vision. ? Tingling or numbness around the mouth, lips, or tongue. ? Tremors or shakes. ? Irritability.  Keep a rapid-acting carbohydrate snack available before, during, and after exercise to help prevent or treat hypoglycemia.  Avoid injecting insulin into areas of the body that are going to be exercised. For example, avoid injecting insulin into: ? The arms, when playing tennis. ? The legs, when jogging.  Keep records of your exercise habits. Doing this can help you and your health care provider adjust your diabetes management plan as needed. Write down: ? Food that you eat before and after you exercise. ? Blood glucose levels before and after you exercise. ? The type and amount of exercise you have done. ? When your insulin is expected to peak, if you use   insulin. Avoid exercising at times when your insulin is peaking.  When you start a new exercise or activity, work with your health care provider to make sure the activity is safe for you, and to adjust your insulin, medicines, or food intake as needed.  Drink plenty of water while  you exercise to prevent dehydration or heat stroke. Drink enough fluid to keep your urine clear or pale yellow. Summary  Exercising regularly is important for your overall health, especially when you have diabetes (diabetes mellitus).  Exercising has many health benefits, such as increasing muscle strength and bone density and reducing body fat and stress.  Your health care provider or certified diabetes educator can help you make a plan for the type and frequency of exercise (activity plan) that works for you.  When you start a new exercise or activity, work with your health care provider to make sure the activity is safe for you, and to adjust your insulin, medicines, or food intake as needed. This information is not intended to replace advice given to you by your health care provider. Make sure you discuss any questions you have with your health care provider. Document Revised: 11/05/2016 Document Reviewed: 09/23/2015 Elsevier Patient Education  2020 [employer]  

## 2020-02-13 NOTE — Progress Notes (Signed)
I,[name],acting as a Education administrator for [name], MD.,have documented all relevant documentation on the behalf of [name], MD,as directed by  [name], MD while in the presence of [name], MD.  This visit occurred during the SARS-CoV-2 public health emergency.  Safety protocols were in place, including screening questions prior to the visit, additional usage of staff PPE, and extensive cleaning of exam room while observing appropriate contact time as indicated for disinfecting solutions.  Subjective:     Patient ID: Crystal Escobar , female    DOB: [birth year] , 49 y.o.   MRN: [id number]   Chief Complaint  Patient presents with  . Diabetes  . Hypertension    HPI  Patient is here today for dm and htn follow up. She reports compliance with medications. She has no complaints at this time.  She admits she is not exercising as much as she should.   Diabetes She presents for her follow-up diabetic visit. She has type 2 diabetes mellitus. There are no hypoglycemic associated symptoms. There are no diabetic associated symptoms. Pertinent negatives for diabetes include no blurred vision. There are no hypoglycemic complications. Risk factors for coronary artery disease include diabetes mellitus, dyslipidemia, hypertension, obesity and sedentary lifestyle. When asked about current treatments, none were reported. She is compliant with treatment some of the time. She is following a generally healthy diet. She participates in exercise intermittently. An ACE inhibitor/angiotensin II receptor blocker is being taken.  Hypertension This is a chronic problem. The current episode started more than 1 year ago. The problem has been gradually improving since onset. The problem is uncontrolled. Pertinent negatives include no blurred vision. Risk factors for coronary artery disease include obesity, sedentary lifestyle and diabetes mellitus. Past treatments include angiotensin blockers. The  current treatment provides moderate improvement.     Past Medical History:  Diagnosis Date  . Abnormal Pap smear 2011  . Diabetes mellitus   . DUB (dysfunctional uterine bleeding) 12/22/2012  . HLD (hyperlipidemia)   . Hypertension      Family History  Problem Relation Age of Onset  . Seizures Father   . Heart attack Mother   . Hypertension Mother   . Diabetes Mother   . Hypertension Sister   . Heart murmur Son   . Sickle cell anemia Maternal Aunt   . Hypertension Maternal Aunt      Current Outpatient Medications:  .  ASHWAGANDHA PO, Take by mouth., Disp: , Rfl:  .  atorvastatin (LIPITOR) 20 MG tablet, Take 1 tablet by mouth once daily, Disp: 30 tablet, Rfl: 0 .  cetirizine (ZYRTEC) 10 MG tablet, Take 10 mg by mouth as needed. , Disp: , Rfl:  .  Cholecalciferol (VITAMIN D3) 125 MCG (5000 UT) CAPS, Take by mouth. 2 per day, Disp: , Rfl:  .  losartan (COZAAR) 50 MG tablet, Take 1 tablet by mouth once daily, Disp: 90 tablet, Rfl: 0 .  medroxyPROGESTERone (PROVERA) 10 MG tablet, Take 10 mg by mouth daily. Take on 1 tablet daily on DAY 1-10 of each month, Disp: , Rfl:  .  Multiple Vitamin (MULTIVITAMIN) capsule, Take 1 capsule by mouth daily., Disp: , Rfl:  .  pantoprazole (PROTONIX) 40 MG tablet, Take 40 mg by mouth daily., Disp: , Rfl:  .  Probiotic Product (PROBIOTIC PO), Take by mouth., Disp: , Rfl:  .  Semaglutide (RYBELSUS) 3 MG TABS, Take by mouth., Disp: , Rfl:  .  traMADol (ULTRAM) 50 MG tablet, TAKE  1 TABLET BY MOUTH EVERY 6 HOURS AS NEEDED, Disp: 30 tablet, Rfl: 0   No Known Allergies   Review of Systems  Constitutional: Negative.   Eyes: Negative for blurred vision.  Respiratory: Negative.   Cardiovascular: Negative.   Gastrointestinal: Negative.   Genitourinary:       She c/o worsening menstrual cramps. Currently followed by GYN - has been taking ibuprofen prn.   Neurological: Negative.      Today's Vitals   02/13/20 1155  BP: 132/74  Pulse: 86  Temp:  98.2 F (36.8 C)  TempSrc: Oral  Weight: (!) 305 lb 6.4 oz (138.5 kg)  Height: 5' 2.2" (1.58 m)   Body mass index is 55.5 kg/m.   Objective:  Physical Exam Vitals and nursing note reviewed.  Constitutional:      Appearance: Normal appearance. She is obese.  HENT:     Head: Normocephalic and atraumatic.  Cardiovascular:     Rate and Rhythm: Normal rate and regular rhythm.     Heart sounds: Normal heart sounds.  Pulmonary:     Effort: Pulmonary effort is normal.     Breath sounds: Normal breath sounds.  Skin:    General: Skin is warm.  Neurological:     General: No focal deficit present.     Mental Status: She is alert.  Psychiatric:        Mood and Affect: Mood normal.        Behavior: Behavior normal.         Assessment And Plan:     1. Uncontrolled type 2 diabetes mellitus with hyperglycemia (HCC) Comments: Chronic, I will check labs as listed below. I will adjust meds as needed.  - Hemoglobin A1c - CMP14+EGFR  2. HYPERTENSION, BENIGN ESSENTIAL Comments: Chronic, controlled. She will continue with current meds. She is encouraged to follow low sodium diet.   3. Dysmenorrhea Comments: Chronic. Further workup as per GYN. Encouraged to use ibuprofen only as needed.  4. Class 3 severe obesity due to excess calories with serious comorbidity and body mass index (BMI) of 50.0 to 59.9 in adult [facility]) Comments: BMI 55. She was congratulated on her weight loss thus far. She has lost 46 pounds since Nov 2020.  Again, importance of exercise was discussed w/ pt.   5. Need for influenza vaccination Comments: She was given flu vaccine.  - Flu Vaccine QUAD 6+ mos PF IM (Fluarix Quad PF)    Patient was given opportunity to ask questions. Patient verbalized understanding of the plan and was able to repeat key elements of the plan. All questions were answered to their satisfaction.  [name], MD   I, [name], MD, have reviewed all documentation for this visit.  The documentation on 02/14/20 for the exam, diagnosis, procedures, and orders are all accurate and complete.  THE PATIENT IS ENCOURAGED TO PRACTICE SOCIAL DISTANCING DUE TO THE COVID-19 PANDEMIC.

## 2020-02-14 LAB — CMP14+EGFR
ALT: 16 IU/L (ref 0–32)
AST: 16 IU/L (ref 0–40)
Albumin/Globulin Ratio: 1.1 — ABNORMAL LOW (ref 1.2–2.2)
Albumin: 3.8 g/dL (ref 3.8–4.8)
Alkaline Phosphatase: 128 IU/L — ABNORMAL HIGH (ref 44–121)
BUN/Creatinine Ratio: 21 (ref 9–23)
BUN: 13 mg/dL (ref 6–24)
Bilirubin Total: 0.2 mg/dL (ref 0.0–1.2)
CO2: 20 mmol/L (ref 20–29)
Calcium: 9.3 mg/dL (ref 8.7–10.2)
Chloride: 106 mmol/L (ref 96–106)
Creatinine, Ser: 0.61 mg/dL (ref 0.57–1.00)
GFR calc Af Amer: 126 mL/min/{1.73_m2} (ref >59)
GFR calc non Af Amer: 109 mL/min/{1.73_m2} (ref >59)
Globulin, Total: 3.4 g/dL (ref 1.5–4.5)
Glucose: 91 mg/dL (ref 65–99)
Potassium: 4.5 mmol/L (ref 3.5–5.2)
Sodium: 140 mmol/L (ref 134–144)
Total Protein: 7.2 g/dL (ref 6.0–8.5)

## 2020-02-14 LAB — HEMOGLOBIN A1C
Est. average glucose Bld gHb Est-mCnc: 137 mg/dL
Hgb A1c MFr Bld: 6.4 % — ABNORMAL HIGH (ref 4.8–5.6)

## 2020-02-20 ENCOUNTER — Encounter: Payer: Self-pay | Admitting: Internal Medicine

## 2020-02-21 ENCOUNTER — Encounter: Payer: Self-pay | Admitting: Internal Medicine

## 2020-02-21 ENCOUNTER — Other Ambulatory Visit: Payer: Self-pay | Admitting: Internal Medicine

## 2020-02-21 DIAGNOSIS — Z3009 Encounter for other general counseling and advice on contraception: Secondary | ICD-10-CM

## 2020-02-21 NOTE — Telephone Encounter (Signed)
She didn't really tell me why I'm having issue she just said it could be scar tissue she wasn't exactly sure she did a [place] sound an she said it came back clear. She wanted me to do the IUD but I don't want to have that done so she prescribed me birth control but I did research they  aren't suggested for women over 40 can have trouble with blood clots so I don't wanna take them either

## 2020-02-27 ENCOUNTER — Other Ambulatory Visit: Payer: Self-pay | Admitting: Internal Medicine

## 2020-02-28 NOTE — Telephone Encounter (Signed)
Ultram refill

## 2020-03-06 ENCOUNTER — Ambulatory Visit: Payer: BC Managed Care – PPO | Admitting: Nurse Practitioner

## 2020-04-14 ENCOUNTER — Other Ambulatory Visit: Payer: Self-pay | Admitting: Internal Medicine

## 2020-05-09 ENCOUNTER — Encounter: Payer: Self-pay | Admitting: Internal Medicine

## 2020-05-09 ENCOUNTER — Other Ambulatory Visit: Payer: Self-pay

## 2020-05-09 ENCOUNTER — Ambulatory Visit: Payer: BC Managed Care – PPO | Admitting: Internal Medicine

## 2020-05-09 VITALS — BP 122/74 | HR 72 | Temp 97.9°F | Ht 62.2 in | Wt 302.2 lb

## 2020-05-09 DIAGNOSIS — I10 Essential (primary) hypertension: Secondary | ICD-10-CM

## 2020-05-09 DIAGNOSIS — Z1211 Encounter for screening for malignant neoplasm of colon: Secondary | ICD-10-CM

## 2020-05-09 DIAGNOSIS — Z6841 Body Mass Index (BMI) 40.0 and over, adult: Secondary | ICD-10-CM

## 2020-05-09 DIAGNOSIS — E1165 Type 2 diabetes mellitus with hyperglycemia: Secondary | ICD-10-CM

## 2020-05-09 MED ORDER — RYBELSUS 7 MG PO TABS: 7.0000 mg | ORAL_TABLET | Freq: Every day | ORAL | 3 refills | Status: DC

## 2020-05-09 NOTE — Progress Notes (Signed)
I,[name],acting as a Education administrator for [name], MD.,have documented all relevant documentation on the behalf of [name], MD,as directed by  [name], MD while in the presence of [name], MD.  This visit occurred during the SARS-CoV-2 public health emergency.  Safety protocols were in place, including screening questions prior to the visit, additional usage of staff PPE, and extensive cleaning of exam room while observing appropriate contact time as indicated for disinfecting solutions.  Subjective:     Patient ID: Crystal Escobar , female    DOB: 06-13-1970 , 50 y.o.   MRN: [id number]   Chief Complaint  Patient presents with  . Diabetes  . Hypertension    HPI  Patient is here today for DM/HTN f/u. She reports compliance with meds. She denies headaches, chest pain and shortness of breath. She admits she is not exercising as much as she should.   Diabetes She presents for her follow-up diabetic visit. She has type 2 diabetes mellitus. There are no hypoglycemic associated symptoms. There are no diabetic associated symptoms. Pertinent negatives for diabetes include no blurred vision. There are no hypoglycemic complications. Risk factors for coronary artery disease include diabetes mellitus, dyslipidemia, hypertension, obesity and sedentary lifestyle. When asked about current treatments, none were reported. She is compliant with treatment some of the time. She is following a generally healthy diet. She participates in exercise intermittently. An ACE inhibitor/angiotensin II receptor blocker is being taken.  Hypertension This is a chronic problem. The current episode started more than 1 year ago. The problem has been gradually improving since onset. The problem is uncontrolled. Pertinent negatives include no blurred vision. Risk factors for coronary artery disease include obesity, sedentary lifestyle and diabetes mellitus. Past treatments include angiotensin  blockers. The current treatment provides moderate improvement.     Past Medical History:  Diagnosis Date  . Abnormal Pap smear 2011  . Diabetes mellitus   . DUB (dysfunctional uterine bleeding) 12/22/2012  . HLD (hyperlipidemia)   . Hypertension      Family History  Problem Relation Age of Onset  . Seizures Father   . Heart attack Mother   . Hypertension Mother   . Diabetes Mother   . Hypertension Sister   . Heart murmur Son   . Sickle cell anemia Maternal Aunt   . Hypertension Maternal Aunt      Current Outpatient Medications:  .  ASHWAGANDHA PO, Take by mouth., Disp: , Rfl:  .  atorvastatin (LIPITOR) 20 MG tablet, Take 1 tablet by mouth once daily, Disp: 30 tablet, Rfl: 0 .  cetirizine (ZYRTEC) 10 MG tablet, Take 10 mg by mouth as needed. , Disp: , Rfl:  .  Cholecalciferol (VITAMIN D3) 125 MCG (5000 UT) CAPS, Take by mouth. 2 per day, Disp: , Rfl:  .  losartan (COZAAR) 50 MG tablet, Take 1 tablet by mouth once daily, Disp: 90 tablet, Rfl: 0 .  Multiple Vitamin (MULTIVITAMIN) capsule, Take 1 capsule by mouth daily., Disp: , Rfl:  .  Probiotic Product (PROBIOTIC PO), Take by mouth., Disp: , Rfl:  .  Semaglutide (RYBELSUS) 7 MG TABS, Take 7 mg by mouth daily., Disp: 30 tablet, Rfl: 3 .  traMADol (ULTRAM) 50 MG tablet, TAKE 1 TABLET BY MOUTH EVERY 6 HOURS AS NEEDED, Disp: 30 tablet, Rfl: 0 .  JENCYCLA 0.35 MG tablet, Take 1 tablet by mouth daily., Disp: , Rfl:  .  medroxyPROGESTERone (PROVERA) 10 MG tablet, Take 10 mg by mouth daily.  Take on 1 tablet daily on DAY 1-10 of each month (Patient not taking: Reported on 05/09/2020), Disp: , Rfl:    No Known Allergies   Review of Systems  Constitutional: Negative.   Eyes: Negative for blurred vision.  Respiratory: Negative.   Cardiovascular: Negative.   Gastrointestinal: Negative.   Psychiatric/Behavioral: Negative.   All other systems reviewed and are negative.    Today's Vitals   05/09/20 1500  BP: 122/74  Pulse: 72   Temp: 97.9 F (36.6 C)  TempSrc: Oral  Weight: (!) 302 lb 3.2 oz (137.1 kg)  Height: 5' 2.2" (1.58 m)   Body mass index is 54.92 kg/m.   Wt Readings from Last 3 Encounters:  05/09/20 (!) 302 lb 3.2 oz (137.1 kg)  02/13/20 (!) 305 lb 6.4 oz (138.5 kg)  11/13/19 (!) 311 lb (141.1 kg)   Objective:  Physical Exam Vitals and nursing note reviewed.  Constitutional:      Appearance: Normal appearance. She is obese.  HENT:     Head: Normocephalic and atraumatic.  Cardiovascular:     Rate and Rhythm: Normal rate and regular rhythm.     Heart sounds: Normal heart sounds.  Pulmonary:     Breath sounds: Normal breath sounds.  Musculoskeletal:     Cervical back: Normal range of motion.  Skin:    General: Skin is warm.  Neurological:     General: No focal deficit present.     Mental Status: She is alert and oriented to person, place, and time.         Assessment And Plan:     1. Uncontrolled type 2 diabetes mellitus with hyperglycemia (HCC) Comments: Chronic. I will increase Rybelsus to $RemoveBef'7mg'zhRWtBRgIE$  daily.  She is reminded to stop eating when full, take upon awakening and to wait 30 min prior to eating/drinking.  - BMP8+EGFR - Hemoglobin A1c - Lipid panel  2. HYPERTENSION, BENIGN ESSENTIAL Comments: Chronic, well controlled. She will continue with current meds. She is encouraged to avoid adding salt to her foods.   3. Class 3 severe obesity due to excess calories with serious comorbidity and body mass index (BMI) of 50.0 to 59.9 in adult [facility]) Comments: BMI 54.  She was congratulated on her 9 pound weight loss since July 2021. Advised to aim to lose another 10 pounds over next 2-3 months. She will f/u 8 wks.   4. Special screening for malignant neoplasm of colon Comments: She agrees to GI referral for CRC screening.  - Ambulatory referral to Gastroenterology  Patient was given opportunity to ask questions. Patient verbalized understanding of the plan and was able to repeat key  elements of the plan. All questions were answered to their satisfaction.  [name], MD   I, [name], MD, have reviewed all documentation for this visit. The documentation on 05/08/20 for the exam, diagnosis, procedures, and orders are all accurate and complete.  THE PATIENT IS ENCOURAGED TO PRACTICE SOCIAL DISTANCING DUE TO THE COVID-19 PANDEMIC.

## 2020-05-09 NOTE — Patient Instructions (Signed)
Diabetes Mellitus and Foot Care Foot care is an important part of your health, especially when you have diabetes. Diabetes may cause you to have problems because of poor blood flow (circulation) to your feet and legs, which can cause your skin to:  Become thinner and drier.  Break more easily.  Heal more slowly.  Peel and crack. You may also have nerve damage (neuropathy) in your legs and feet, causing decreased feeling in them. This means that you may not notice minor injuries to your feet that could lead to more serious problems. Noticing and addressing any potential problems early is the best way to prevent future foot problems. How to care for your feet Foot hygiene  Wash your feet daily with warm water and mild soap. Do not use hot water. Then, pat your feet and the areas between your toes until they are completely dry. Do not soak your feet as this can dry your skin.  Trim your toenails straight across. Do not dig under them or around the cuticle. File the edges of your nails with an emery board or nail file.  Apply a moisturizing lotion or petroleum jelly to the skin on your feet and to dry, brittle toenails. Use lotion that does not contain alcohol and is unscented. Do not apply lotion between your toes.   Shoes and socks  Wear clean socks or stockings every day. Make sure they are not too tight. Do not wear knee-high stockings since they may decrease blood flow to your legs.  Wear shoes that fit properly and have enough cushioning. Always look in your shoes before you put them on to be sure there are no objects inside.  To break in new shoes, wear them for just a few hours a day. This prevents injuries on your feet. Wounds, scrapes, corns, and calluses  Check your feet daily for blisters, cuts, bruises, sores, and redness. If you cannot see the bottom of your feet, use a mirror or ask someone for help.  Do not cut corns or calluses or try to remove them with medicine.  If you  find a minor scrape, cut, or break in the skin on your feet, keep it and the skin around it clean and dry. You may clean these areas with mild soap and water. Do not clean the area with peroxide, alcohol, or iodine.  If you have a wound, scrape, corn, or callus on your foot, look at it several times a day to make sure it is healing and not infected. Check for: ? Redness, swelling, or pain. ? Fluid or blood. ? Warmth. ? Pus or a bad smell.   General tips  Do not cross your legs. This may decrease blood flow to your feet.  Do not use heating pads or hot water bottles on your feet. They may burn your skin. If you have lost feeling in your feet or legs, you may not know this is happening until it is too late.  Protect your feet from hot and cold by wearing shoes, such as at the beach or on hot pavement.  Schedule a complete foot exam at least once a year (annually) or more often if you have foot problems. Report any cuts, sores, or bruises to your health care provider immediately. Where to find more information  American Diabetes Association: www.diabetes.org  Association of Diabetes Care & Education Specialists: www.diabeteseducator.org Contact a health care provider if:  You have a medical condition that increases your risk of infection and   you have any cuts, sores, or bruises on your feet.  You have an injury that is not healing.  You have redness on your legs or feet.  You feel burning or tingling in your legs or feet.  You have pain or cramps in your legs and feet.  Your legs or feet are numb.  Your feet always feel cold.  You have pain around any toenails. Get help right away if:  You have a wound, scrape, corn, or callus on your foot and: ? You have pain, swelling, or redness that gets worse. ? You have fluid or blood coming from the wound, scrape, corn, or callus. ? Your wound, scrape, corn, or callus feels warm to the touch. ? You have pus or a bad smell coming from  the wound, scrape, corn, or callus. ? You have a fever. ? You have a red line going up your leg. Summary  Check your feet every day for blisters, cuts, bruises, sores, and redness.  Apply a moisturizing lotion or petroleum jelly to the skin on your feet and to dry, brittle toenails.  Wear shoes that fit properly and have enough cushioning.  If you have foot problems, report any cuts, sores, or bruises to your health care provider immediately.  Schedule a complete foot exam at least once a year (annually) or more often if you have foot problems. This information is not intended to replace advice given to you by your health care provider. Make sure you discuss any questions you have with your health care provider. Document Revised: 11/02/2019 Document Reviewed: 11/02/2019 Elsevier Patient Education  2021 [employer]  

## 2020-05-10 LAB — LIPID PANEL
Chol/HDL Ratio: 2.4 ratio (ref 0.0–4.4)
Cholesterol, Total: 135 mg/dL (ref 100–199)
HDL: 56 mg/dL (ref >39)
LDL Chol Calc (NIH): 65 mg/dL (ref 0–99)
Triglycerides: 68 mg/dL (ref 0–149)
VLDL Cholesterol Cal: 14 mg/dL (ref 5–40)

## 2020-05-10 LAB — BMP8+EGFR
BUN/Creatinine Ratio: 12 (ref 9–23)
BUN: 7 mg/dL (ref 6–24)
CO2: 23 mmol/L (ref 20–29)
Calcium: 9.6 mg/dL (ref 8.7–10.2)
Chloride: 103 mmol/L (ref 96–106)
Creatinine, Ser: 0.59 mg/dL (ref 0.57–1.00)
GFR calc Af Amer: 126 mL/min/{1.73_m2} (ref >59)
GFR calc non Af Amer: 109 mL/min/{1.73_m2} (ref >59)
Glucose: 112 mg/dL — ABNORMAL HIGH (ref 65–99)
Potassium: 4.5 mmol/L (ref 3.5–5.2)
Sodium: 138 mmol/L (ref 134–144)

## 2020-05-10 LAB — HEMOGLOBIN A1C
Est. average glucose Bld gHb Est-mCnc: 128 mg/dL
Hgb A1c MFr Bld: 6.1 % — ABNORMAL HIGH (ref 4.8–5.6)

## 2020-05-15 ENCOUNTER — Ambulatory Visit: Payer: BC Managed Care – PPO | Admitting: Internal Medicine

## 2020-05-15 ENCOUNTER — Encounter: Payer: Self-pay | Admitting: Internal Medicine

## 2020-05-17 ENCOUNTER — Telehealth: Payer: Self-pay

## 2020-05-17 NOTE — Telephone Encounter (Signed)
Unable to complete pa for rybelsus

## 2020-05-20 ENCOUNTER — Other Ambulatory Visit: Payer: Self-pay | Admitting: Internal Medicine

## 2020-05-29 ENCOUNTER — Other Ambulatory Visit: Payer: Self-pay

## 2020-05-29 LAB — HM MAMMOGRAPHY: HM Mammogram: NORMAL (ref 0–4)

## 2020-05-29 NOTE — Telephone Encounter (Signed)
Prior auth done for rybelsus waiting on response from the pt's insurance.

## 2020-05-31 ENCOUNTER — Encounter: Payer: Self-pay | Admitting: Internal Medicine

## 2020-06-03 ENCOUNTER — Encounter: Payer: Self-pay | Admitting: Internal Medicine

## 2020-06-06 ENCOUNTER — Other Ambulatory Visit: Payer: Self-pay

## 2020-06-06 NOTE — Telephone Encounter (Signed)
Called the pt's insurance and they will fax over a prior authorization form for rybelsus 7 [id number]

## 2020-06-11 ENCOUNTER — Encounter: Payer: Self-pay | Admitting: Internal Medicine

## 2020-06-26 ENCOUNTER — Ambulatory Visit: Payer: BC Managed Care – PPO | Admitting: Gastroenterology

## 2020-07-03 ENCOUNTER — Ambulatory Visit: Payer: BC Managed Care – PPO | Admitting: Gastroenterology

## 2020-07-09 ENCOUNTER — Other Ambulatory Visit: Payer: Self-pay

## 2020-07-09 ENCOUNTER — Encounter: Payer: Self-pay | Admitting: Gastroenterology

## 2020-07-09 ENCOUNTER — Ambulatory Visit (INDEPENDENT_AMBULATORY_CARE_PROVIDER_SITE_OTHER): Payer: BC Managed Care – PPO | Admitting: Gastroenterology

## 2020-07-09 DIAGNOSIS — Z01818 Encounter for other preprocedural examination: Secondary | ICD-10-CM

## 2020-07-09 DIAGNOSIS — Z1211 Encounter for screening for malignant neoplasm of colon: Secondary | ICD-10-CM | POA: Diagnosis not present

## 2020-07-09 DIAGNOSIS — Z6841 Body Mass Index (BMI) 40.0 and over, adult: Secondary | ICD-10-CM

## 2020-07-09 MED ORDER — PEG 3350-KCL-NA BICARB-NACL 420 G PO SOLR: 4000.0000 mL | ORAL | 0 refills | Status: DC

## 2020-07-09 NOTE — Progress Notes (Signed)
Primary Care Physician:  Glendale Chard, MD  Primary Gastroenterologist:  Elon Alas. Abbey Chatters, DO   Chief Complaint  Patient presents with  . Consult    TCS never done prior    HPI:  Crystal Escobar is a 50 y.o. female here at the request of Dr. Glendale Chard to schedule screening colonoscopy.  Due to BMI, she was brought in for evaluation for determination of appropriate anesthesia needed for procedure.  Maximum weight in the past 2 years 360 pounds.  Underwent gastric bypass February 2021.  Weight is down to 307 pounds.   BM regular. No melena, brbpr. No abdominal pain. No n/v. No heartburn. Sometimes gassy. No dysphagia.  No prior colonoscopy.  No family history of colon cancer.   Current Outpatient Medications  Medication Sig Dispense Refill  . ASHWAGANDHA PO Take by mouth. 1-2 times per day    . atorvastatin (LIPITOR) 20 MG tablet Take 1 tablet by mouth once daily 30 tablet 0  . cetirizine (ZYRTEC) 10 MG tablet Take 10 mg by mouth as needed.     . Cholecalciferol (VITAMIN D3) 125 MCG (5000 UT) CAPS Take by mouth. 3 per day    . JENCYCLA 0.35 MG tablet Take 1 tablet by mouth daily.    Marland Kitchen losartan (COZAAR) 50 MG tablet Take 1 tablet by mouth once daily 90 tablet 0  . Multiple Vitamin (MULTIVITAMIN) capsule Take 1 capsule by mouth daily.    . Probiotic Product (PROBIOTIC PO) Take by mouth.    . Semaglutide (RYBELSUS) 7 MG TABS Take 7 mg by mouth daily. 30 tablet 3   No current facility-administered medications for this visit.    Allergies as of 07/09/2020  . (No Known Allergies)    Past Medical History:  Diagnosis Date  . Abnormal Pap smear 2011  . Diabetes mellitus   . DUB (dysfunctional uterine bleeding) 12/22/2012  . HLD (hyperlipidemia)   . Hypertension   . Sleep apnea    does not use machine as prescribed    Past Surgical History:  Procedure Laterality Date  . CARPAL TUNNEL RELEASE Right 2016  . CESAREAN SECTION    . 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 Shawna Orleans  .  KNEE SURGERY Left    cartilage  . TUBAL LIGATION      Family History  Problem Relation Age of Onset  . Seizures Father   . Heart attack Mother   . Hypertension Mother   . Diabetes Mother   . Hypertension Sister   . Heart murmur Son   . Lung cancer Maternal Grandmother   . Sickle cell anemia Maternal Aunt   . Hypertension Maternal Aunt   . Colon cancer Neg Hx     Social History   Socioeconomic History  . Marital status: Married    Spouse name: Not on file  . Number of children: Not on file  . Years of education: Not on file  . Highest education level: Not on file  Occupational History  . Not on file  Tobacco Use  . Smoking status: Former Smoker    Packs/day: 1.00    Years: 10.00    Pack years: 10.00    Types: Cigars    Quit date: 06/03/2018    Years since quitting: 2.1  . Smokeless tobacco: Never Used  . Tobacco comment: 1 cigar a day  Vaping Use  . Vaping Use: Never used  Substance and Sexual Activity  . Alcohol use: Yes    Comment:  occasionally   . Drug use: No  . Sexual activity: Yes    Partners: Male    Birth control/protection: Surgical    Comment: BTL  Other Topics Concern  . Not on file  Social History Narrative  . Not on file   Social Determinants of Health   Financial Resource Strain: Not on file  Food Insecurity: Not on file  Transportation Needs: Not on file  Physical Activity: Not on file  Stress: Not on file  Social Connections: Not on file  Intimate Partner Violence: Not on file      ROS:  General: Negative for anorexia, unintentional weight loss, fever, chills, fatigue, weakness. Eyes: Negative for vision changes.  ENT: Negative for hoarseness, difficulty swallowing , nasal congestion. CV: Negative for chest pain, angina, palpitations, dyspnea on exertion, peripheral edema.  Respiratory: Negative for dyspnea at rest, dyspnea on exertion, cough, sputum, wheezing.  GI: See history of present illness. GU:  Negative for dysuria,  hematuria, urinary incontinence, urinary frequency, nocturnal urination.  MS: Negative for joint pain, low back pain.  Derm: Negative for rash or itching.  Neuro: Negative for weakness, abnormal sensation, seizure, frequent headaches, memory loss, confusion.  Psych: Negative for anxiety, depression, suicidal ideation, hallucinations.  Endo: Negative for unusual weight change.  Heme: Negative for bruising or bleeding. Allergy: Negative for rash or hives.    Physical Examination:  BP (!) 141/87   Pulse 65   Temp (!) 97.5 F (36.4 C)   Ht 5\' 2"  (1.575 m)   Wt (!) 307 lb 9.6 oz (139.5 kg)   LMP 07/01/2020   BMI 56.26 kg/m    General: Well-nourished, well-developed in no acute distress.  Head: Normocephalic, atraumatic.   Eyes: Conjunctiva pink, no icterus. Mouth: Masked. Neck: Supple without thyromegaly, masses, or lymphadenopathy.  Lungs: Clear to auscultation bilaterally.  Heart: Regular rate and rhythm, no murmurs rubs or gallops.  Abdomen: Bowel sounds are normal, nontender, nondistended, no hepatosplenomegaly or masses, no abdominal bruits or    hernia , no rebound or guarding.   Rectal: not performed Extremities: No lower extremity edema. No clubbing or deformities.  Neuro: Alert and oriented x 4 , grossly normal neurologically.  Skin: Warm and dry, no rash or jaundice.   Psych: Alert and cooperative, normal mood and affect.  Labs: Lab Results  Component Value Date   CREATININE 0.59 05/09/2020   BUN 7 05/09/2020   NA 138 05/09/2020   K 4.5 05/09/2020   CL 103 05/09/2020   CO2 23 05/09/2020   Lab Results  Component Value Date   ALT 16 02/13/2020   AST 16 02/13/2020   ALKPHOS 128 (H) 02/13/2020   BILITOT 0.2 02/13/2020   Lab Results  Component Value Date   HGBA1C 6.1 (H) 05/09/2020   Lab Results  Component Value Date   WBC 4.6 07/04/2019   HGB 12.5 07/04/2019   HCT 39.1 07/04/2019   MCV 83 07/04/2019   PLT 299 07/04/2019     Imaging Studies: No  results found.  Assessment/plan:  Pleasant 50 year old female with history of morbid obesity, sleep apnea (noncompliant), diabetes presenting to schedule first-ever screening colonoscopy. Due to multiple comorbidities she will require sedation with assistance of anesthesiology. Recommend colonoscopy with Dr. Abbey Chatters with propofol. ASA III.  I have discussed the risks, alternatives, benefits with regards to but not limited to the risk of reaction to medication, bleeding, infection, perforation and the patient is agreeable to proceed. Written consent to be obtained.

## 2020-07-09 NOTE — Patient Instructions (Signed)
1. Colonoscopy as scheduled. Please see separate instructions. 

## 2020-07-12 ENCOUNTER — Telehealth: Payer: Self-pay | Admitting: Gastroenterology

## 2020-07-12 NOTE — Telephone Encounter (Signed)
Patient needs to reschedule her procedure, can not get off work

## 2020-07-12 NOTE — Telephone Encounter (Signed)
Called pt. She has been rescheduled to 5/17 am appt. Aware will mail new prep instructions with new covid/pre-op appt. She voiced understanding.

## 2020-07-15 ENCOUNTER — Encounter: Payer: Self-pay | Admitting: *Deleted

## 2020-07-19 LAB — HM PAP SMEAR: HM Pap smear: NORMAL

## 2020-08-05 ENCOUNTER — Encounter: Payer: BC Managed Care – PPO | Admitting: Internal Medicine

## 2020-08-08 ENCOUNTER — Encounter: Payer: Self-pay | Admitting: Internal Medicine

## 2020-08-08 ENCOUNTER — Ambulatory Visit: Payer: BC Managed Care – PPO | Admitting: Internal Medicine

## 2020-08-08 ENCOUNTER — Other Ambulatory Visit: Payer: Self-pay

## 2020-08-08 VITALS — BP 130/78 | HR 68 | Temp 97.6°F | Ht 62.0 in | Wt 300.0 lb

## 2020-08-08 DIAGNOSIS — L304 Erythema intertrigo: Secondary | ICD-10-CM | POA: Diagnosis not present

## 2020-08-08 DIAGNOSIS — Z23 Encounter for immunization: Secondary | ICD-10-CM | POA: Diagnosis not present

## 2020-08-08 DIAGNOSIS — Z0000 Encounter for general adult medical examination without abnormal findings: Secondary | ICD-10-CM | POA: Diagnosis not present

## 2020-08-08 DIAGNOSIS — I10 Essential (primary) hypertension: Secondary | ICD-10-CM

## 2020-08-08 DIAGNOSIS — E1165 Type 2 diabetes mellitus with hyperglycemia: Secondary | ICD-10-CM

## 2020-08-08 LAB — POCT URINALYSIS DIPSTICK
Bilirubin, UA: NEGATIVE
Blood, UA: NEGATIVE
Glucose, UA: NEGATIVE
Ketones, UA: NEGATIVE
Nitrite, UA: NEGATIVE
Protein, UA: POSITIVE — AB
Spec Grav, UA: 1.015 (ref 1.010–1.025)
Urobilinogen, UA: 0.2 E.U./dL
pH, UA: 7.5 (ref 5.0–8.0)

## 2020-08-08 LAB — POCT UA - MICROALBUMIN
Albumin/Creatinine Ratio, Urine, POC: >300
Creatinine, POC: 50 mg/dL
Microalbumin Ur, POC: 150 mg/L

## 2020-08-08 MED ORDER — NYSTATIN 100000 UNIT/GM EX POWD: 1.0000 "application " | Freq: Three times a day (TID) | CUTANEOUS | 0 refills | Status: DC

## 2020-08-08 NOTE — Progress Notes (Signed)
I,Crystal Escobar,acting as a Education administrator for Crystal Greenland, MD.,have documented all relevant documentation on the behalf of Crystal Greenland, MD,as directed by  Crystal Greenland, MD while in the presence of Crystal Greenland, MD.  This visit occurred during the SARS-CoV-2 public health emergency.  Safety protocols were in place, including screening questions prior to the visit, additional usage of staff PPE, and extensive cleaning of exam room while observing appropriate contact time as indicated for disinfecting solutions.  Subjective:     Patient ID: Crystal Escobar , female    DOB: 01-25-1971 , 50 y.o.   MRN: [id number]   Chief Complaint  Patient presents with  . Annual Exam  . Diabetes  . Hypertension    HPI  She is here today for a full physical examination. She is followed by Dr. Vanessa Escobar for her Gyn care.  She has no specific concerns or complaints at this time.  Diabetes She presents for her follow-up diabetic visit. She has type 2 diabetes mellitus. There are no hypoglycemic associated symptoms. There are no diabetic associated symptoms. Pertinent negatives for diabetes include no blurred vision and no chest pain. There are no hypoglycemic complications. Risk factors for coronary artery disease include diabetes mellitus, dyslipidemia, hypertension, obesity and sedentary lifestyle. When asked about current treatments, none were reported. She is compliant with treatment some of the time. She is following a generally healthy diet. She participates in exercise intermittently. An ACE inhibitor/angiotensin II receptor blocker is being taken.  Hypertension This is a chronic problem. The current episode started more than 1 year ago. The problem has been gradually improving since onset. The problem is uncontrolled. Pertinent negatives include no blurred vision, chest pain, palpitations or shortness of breath.     Past Medical History:  Diagnosis Date  . Abnormal Pap smear 2011  . Diabetes  mellitus   . DUB (dysfunctional uterine bleeding) 12/22/2012  . HLD (hyperlipidemia)   . Hypertension   . Sleep apnea    does not use machine as prescribed     Family History  Problem Relation Age of Onset  . Seizures Father   . Heart attack Mother   . Hypertension Mother   . Diabetes Mother   . Hypertension Sister   . Heart murmur Son   . Lung cancer Maternal Grandmother   . Sickle cell anemia Maternal Aunt   . Hypertension Maternal Aunt   . Colon cancer Neg Hx      Current Outpatient Medications:  .  ASHWAGANDHA PO, Take by mouth. 1-2 times per day, Disp: , Rfl:  .  atorvastatin (LIPITOR) 20 MG tablet, Take 1 tablet by mouth once daily, Disp: 30 tablet, Rfl: 0 .  cetirizine (ZYRTEC) 10 MG tablet, Take 10 mg by mouth as needed. , Disp: , Rfl:  .  Cholecalciferol (VITAMIN D3) 125 MCG (5000 UT) CAPS, Take by mouth. 3 per day, Disp: , Rfl:  .  JENCYCLA 0.35 MG tablet, Take 1 tablet by mouth daily., Disp: , Rfl:  .  losartan (COZAAR) 50 MG tablet, Take 1 tablet by mouth once daily, Disp: 90 tablet, Rfl: 0 .  Multiple Vitamin (MULTIVITAMIN) capsule, Take 1 capsule by mouth daily., Disp: , Rfl:  .  nystatin (NYSTATIN) powder, Apply 1 application topically 3 (three) times daily., Disp: 60 g, Rfl: 0 .  polyethylene glycol-electrolytes (TRILYTE) 420 g solution, Take 4,000 mLs by mouth as directed., Disp: 4000 mL, Rfl: 0 .  Probiotic Product (PROBIOTIC PO), Take by  mouth., Disp: , Rfl:  .  Semaglutide (RYBELSUS) 7 MG TABS, Take 7 mg by mouth daily., Disp: 30 tablet, Rfl: 3   No Known Allergies    The patient states she uses OCP (estrogen/progesterone) for birth control. Last LMP was No LMP recorded. (Menstrual status: Irregular Periods).. Negative for Dysmenorrhea. Negative for: breast discharge, breast lump(s), breast pain and breast self exam. Associated symptoms include abnormal vaginal bleeding. Pertinent negatives include abnormal bleeding (hematology), anxiety, decreased libido,  depression, difficulty falling sleep, dyspareunia, history of infertility, nocturia, sexual dysfunction, sleep disturbances, urinary incontinence, urinary urgency, vaginal discharge and vaginal itching. Diet regular.The patient states her exercise level is  intermittent.  . The patient's tobacco use is:  Social History   Tobacco Use  Smoking Status Former Smoker  . Packs/day: 1.00  . Years: 10.00  . Pack years: 10.00  . Types: Cigars  . Quit date: 06/03/2018  . Years since quitting: 2.1  Smokeless Tobacco Never Used  Tobacco Comment   1 cigar a day  . She has been exposed to passive smoke. The patient's alcohol use is:  Social History   Substance and Sexual Activity  Alcohol Use Yes   Comment: occasionally    Review of Systems  Constitutional: Negative.   HENT: Negative.   Eyes: Negative.  Negative for blurred vision.  Respiratory: Negative.  Negative for shortness of breath.   Cardiovascular: Negative.  Negative for chest pain and palpitations.  Gastrointestinal: Negative.   Endocrine: Negative.   Genitourinary: Negative.   Musculoskeletal: Negative.   Skin: Positive for rash.  Allergic/Immunologic: Negative.   Neurological: Negative.   Hematological: Negative.   Psychiatric/Behavioral: Negative.      Today's Vitals   08/08/20 0936  BP: 130/78  Pulse: 68  Temp: 97.6 F (36.4 C)  TempSrc: Oral  Weight: 300 lb (136.1 kg)  Height: $Remove'5\' 2"'rjNAUxw$  (1.575 m)   Body mass index is 54.87 kg/m.  Wt Readings from Last 3 Encounters:  08/08/20 300 lb (136.1 kg)  07/09/20 (!) 307 lb 9.6 oz (139.5 kg)  05/09/20 (!) 302 lb 3.2 oz (137.1 kg)    Objective:  Physical Exam Vitals and nursing note reviewed.  Constitutional:      Appearance: Normal appearance. She is obese.  HENT:     Head: Normocephalic and atraumatic.     Right Ear: Tympanic membrane, ear canal and external ear normal.     Left Ear: Tympanic membrane, ear canal and external ear normal.     Nose:     Comments:  Masked     Mouth/Throat:     Comments: Masked  Eyes:     Extraocular Movements: Extraocular movements intact.     Conjunctiva/sclera: Conjunctivae normal.     Pupils: Pupils are equal, round, and reactive to light.  Cardiovascular:     Rate and Rhythm: Normal rate and regular rhythm.     Pulses: Normal pulses.          Dorsalis pedis pulses are 2+ on the right side and 2+ on the left side.     Heart sounds: Normal heart sounds.  Pulmonary:     Effort: Pulmonary effort is normal.     Breath sounds: Normal breath sounds.  Chest:  Breasts:     Tanner Score is 5.     Right: Normal.     Left: Normal.    Abdominal:     General: Bowel sounds are normal.     Palpations: Abdomen is soft.  Comments: Large pannus  Genitourinary:    Comments: deferred Musculoskeletal:        General: Normal range of motion.     Cervical back: Normal range of motion and neck supple.  Feet:     Right foot:     Protective Sensation: 5 sites tested. 5 sites sensed.     Skin integrity: Dry skin present.     Toenail Condition: Right toenails are normal.     Left foot:     Protective Sensation: 5 sites tested. 5 sites sensed.     Skin integrity: Dry skin present.     Toenail Condition: Left toenails are normal.  Skin:    General: Skin is warm.     Findings: Erythema and rash present.     Comments: Area of erythema in abdominal skin fold. No vesicular lesions noted  Neurological:     General: No focal deficit present.     Mental Status: She is alert and oriented to person, place, and time.  Psychiatric:        Mood and Affect: Mood normal.        Behavior: Behavior normal.         Assessment And Plan:     1. Routine general medical examination at health care facility Comments: A full exam was performed.  Importance of monthly self breast exams was discussed with the patient. She is scheduled for colonoscopy in May. PATIENT IS ADVISED TO GET 30-45 MINUTES REGULAR EXERCISE NO LESS THAN FOUR TO  FIVE DAYS PER WEEK - BOTH WEIGHTBEARING EXERCISES AND AEROBIC ARE RECOMMENDED.  PATIENT IS ADVISED TO FOLLOW A HEALTHY DIET WITH AT LEAST SIX FRUITS/VEGGIES PER DAY, DECREASE INTAKE OF RED MEAT, AND TO INCREASE FISH INTAKE TO TWO DAYS PER WEEK.  MEATS/FISH SHOULD NOT BE FRIED, BAKED OR BROILED IS PREFERABLE.  IT IS ALSO IMPORTANT TO CUT BACK ON YOUR SUGAR INTAKE. PLEASE AVOID ANYTHING WITH ADDED SUGAR, CORN SYRUP OR OTHER SWEETENERS. IF YOU MUST USE A SWEETENER, YOU CAN TRY STEVIA. IT IS ALSO IMPORTANT TO AVOID ARTIFICIALLY SWEETENERS AND DIET BEVERAGES. LASTLY, I SUGGEST WEARING SPF 50 SUNSCREEN ON EXPOSED PARTS AND ESPECIALLY WHEN IN THE DIRECT SUNLIGHT FOR AN EXTENDED PERIOD OF TIME.  PLEASE AVOID FAST FOOD RESTAURANTS AND INCREASE YOUR WATER INTAKE.  - CBC - Hemoglobin A1c - CMP14+EGFR  2. Uncontrolled type 2 diabetes mellitus with hyperglycemia ([place]) Comments: Diabetic foot exam was performed. I DISCUSSED WITH THE PATIENT AT LENGTH REGARDING THE GOALS OF GLYCEMIC CONTROL AND POSSIBLE LONG-TERM COMPLICATIONS.  I  ALSO STRESSED THE IMPORTANCE OF COMPLIANCE WITH HOME GLUCOSE MONITORING, DIETARY RESTRICTIONS INCLUDING AVOIDANCE OF SUGARY DRINKS/PROCESSED FOODS,  ALONG WITH REGULAR EXERCISE.  I  ALSO STRESSED THE IMPORTANCE OF ANNUAL EYE EXAMS, SELF FOOT CARE AND COMPLIANCE WITH OFFICE VISITS.   3. HYPERTENSION, BENIGN ESSENTIAL Comments: Chronic, controlled. She is encouraged to limit her sodium intake. EKG performed, NSR w/o acute changes.  She will f/u in 4 months for f/u.  - POCT UA - Microalbumin - POCT Urinalysis Dipstick (81002) - EKG 12-Lead  4. Intertrigo Comments: She was given rx Nystatin powder to apply to affected area twice daily prn. She is advised to take pictures during each occurrence.   5. Immunization due Comments: She was given Pneumovax-23 to update her immunization history.      Patient was given opportunity to ask questions. Patient verbalized understanding of the  plan and was able to repeat key elements of the plan. All questions were answered  to their satisfaction.    I, Crystal Greenland, MD, have reviewed all documentation for this visit. The documentation on 08/08/20 for the exam, diagnosis, procedures, and orders are all accurate and complete.  THE PATIENT IS ENCOURAGED TO PRACTICE SOCIAL DISTANCING DUE TO THE COVID-19 PANDEMIC.

## 2020-08-08 NOTE — Patient Instructions (Signed)
Health Maintenance, Female Adopting a healthy lifestyle and getting preventive care are important in promoting health and wellness. Ask your health care provider about:  The right schedule for you to have regular tests and exams.  Things you can do on your own to prevent diseases and keep yourself healthy. What should I know about diet, weight, and exercise? Eat a healthy diet  Eat a diet that includes plenty of vegetables, fruits, low-fat dairy products, and lean protein.  Do not eat a lot of foods that are high in solid fats, added sugars, or sodium.   Maintain a healthy weight Body mass index (BMI) is used to identify weight problems. It estimates body fat based on height and weight. Your health care provider can help determine your BMI and help you achieve or maintain a healthy weight. Get regular exercise Get regular exercise. This is one of the most important things you can do for your health. Most adults should:  Exercise for at least 150 minutes each week. The exercise should increase your heart rate and make you sweat (moderate-intensity exercise).  Do strengthening exercises at least twice a week. This is in addition to the moderate-intensity exercise.  Spend less time sitting. Even light physical activity can be beneficial. Watch cholesterol and blood lipids Have your blood tested for lipids and cholesterol at 48 years of age, then have this test every 5 years. Have your cholesterol levels checked more often if:  Your lipid or cholesterol levels are high.  You are older than 48 years of age.  You are at high risk for heart disease. What should I know about cancer screening? Depending on your health history and family history, you may need to have cancer screening at various ages. This may include screening for:  Breast cancer.  Cervical cancer.  Colorectal cancer.  Skin cancer.  Lung cancer. What should I know about heart disease, diabetes, and high blood  pressure? Blood pressure and heart disease  High blood pressure causes heart disease and increases the risk of stroke. This is more likely to develop in people who have high blood pressure readings, are of African descent, or are overweight.  Have your blood pressure checked: ? Every 3-5 years if you are 18-39 years of age. ? Every year if you are 40 years old or older. Diabetes Have regular diabetes screenings. This checks your fasting blood sugar level. Have the screening done:  Once every three years after age 40 if you are at a normal weight and have a low risk for diabetes.  More often and at a younger age if you are overweight or have a high risk for diabetes. What should I know about preventing infection? Hepatitis B If you have a higher risk for hepatitis B, you should be screened for this virus. Talk with your health care provider to find out if you are at risk for hepatitis B infection. Hepatitis C Testing is recommended for:  Everyone born from 1945 through 1965.  Anyone with known risk factors for hepatitis C. Sexually transmitted infections (STIs)  Get screened for STIs, including gonorrhea and chlamydia, if: ? You are sexually active and are younger than 48 years of age. ? You are older than 48 years of age and your health care provider tells you that you are at risk for this type of infection. ? Your sexual activity has changed since you were last screened, and you are at increased risk for chlamydia or gonorrhea. Ask your health care provider   if you are at risk.  Ask your health care provider about whether you are at high risk for HIV. Your health care provider may recommend a prescription medicine to help prevent HIV infection. If you choose to take medicine to prevent HIV, you should first get tested for HIV. You should then be tested every 3 months for as long as you are taking the medicine. Pregnancy  If you are about to stop having your period (premenopausal) and  you may become pregnant, seek counseling before you get pregnant.  Take 400 to 800 micrograms (mcg) of folic acid every day if you become pregnant.  Ask for birth control (contraception) if you want to prevent pregnancy. Osteoporosis and menopause Osteoporosis is a disease in which the bones lose minerals and strength with aging. This can result in bone fractures. If you are 65 years old or older, or if you are at risk for osteoporosis and fractures, ask your health care provider if you should:  Be screened for bone loss.  Take a calcium or vitamin D supplement to lower your risk of fractures.  Be given hormone replacement therapy (HRT) to treat symptoms of menopause. Follow these instructions at home: Lifestyle  Do not use any products that contain nicotine or tobacco, such as cigarettes, e-cigarettes, and chewing tobacco. If you need help quitting, ask your health care provider.  Do not use street drugs.  Do not share needles.  Ask your health care provider for help if you need support or information about quitting drugs. Alcohol use  Do not drink alcohol if: ? Your health care provider tells you not to drink. ? You are pregnant, may be pregnant, or are planning to become pregnant.  If you drink alcohol: ? Limit how much you use to 0-1 drink a day. ? Limit intake if you are breastfeeding.  Be aware of how much alcohol is in your drink. In the [place] one drink equals one 12 oz bottle of beer (355 mL), one 5 oz glass of wine (148 mL), or one 1 oz glass of hard liquor (44 mL). General instructions  Schedule regular health, dental, and eye exams.  Stay current with your vaccines.  Tell your health care provider if: ? You often feel depressed. ? You have ever been abused or do not feel safe at home. Summary  Adopting a healthy lifestyle and getting preventive care are important in promoting health and wellness.  Follow your health care provider's instructions about healthy  diet, exercising, and getting tested or screened for diseases.  Follow your health care provider's instructions on monitoring your cholesterol and blood pressure. This information is not intended to replace advice given to you by your health care provider. Make sure you discuss any questions you have with your health care provider. Document Revised: 04/06/2018 Document Reviewed: 04/06/2018 Elsevier Patient Education  2021 [employer]  

## 2020-08-09 LAB — CMP14+EGFR
ALT: 19 IU/L (ref 0–32)
AST: 19 IU/L (ref 0–40)
Albumin/Globulin Ratio: 1.2 (ref 1.2–2.2)
Albumin: 4.2 g/dL (ref 3.8–4.8)
Alkaline Phosphatase: 135 IU/L — ABNORMAL HIGH (ref 44–121)
BUN/Creatinine Ratio: 15 (ref 9–23)
BUN: 10 mg/dL (ref 6–24)
Bilirubin Total: 0.3 mg/dL (ref 0.0–1.2)
CO2: 22 mmol/L (ref 20–29)
Calcium: 9.8 mg/dL (ref 8.7–10.2)
Chloride: 101 mmol/L (ref 96–106)
Creatinine, Ser: 0.67 mg/dL (ref 0.57–1.00)
Globulin, Total: 3.6 g/dL (ref 1.5–4.5)
Glucose: 103 mg/dL — ABNORMAL HIGH (ref 65–99)
Potassium: 4.9 mmol/L (ref 3.5–5.2)
Sodium: 139 mmol/L (ref 134–144)
Total Protein: 7.8 g/dL (ref 6.0–8.5)
eGFR: 108 mL/min/{1.73_m2} (ref >59)

## 2020-08-09 LAB — HEMOGLOBIN A1C
Est. average glucose Bld gHb Est-mCnc: 134 mg/dL
Hgb A1c MFr Bld: 6.3 % — ABNORMAL HIGH (ref 4.8–5.6)

## 2020-08-09 LAB — CBC
Hematocrit: 41 % (ref 34.0–46.6)
Hemoglobin: 13.1 g/dL (ref 11.1–15.9)
MCH: 26.8 pg (ref 26.6–33.0)
MCHC: 32 g/dL (ref 31.5–35.7)
MCV: 84 fL (ref 79–97)
Platelets: 358 10*3/uL (ref 150–450)
RBC: 4.89 x10E6/uL (ref 3.77–5.28)
RDW: 16.5 % — ABNORMAL HIGH (ref 11.7–15.4)
WBC: 5.9 10*3/uL (ref 3.4–10.8)

## 2020-08-16 ENCOUNTER — Other Ambulatory Visit (HOSPITAL_COMMUNITY): Payer: BC Managed Care – PPO

## 2020-09-04 NOTE — Patient Instructions (Signed)
Crystal Escobar  09/04/2020     @PREFPERIOPPHARMACY @   Your procedure is scheduled on   09/10/2020   Report to [facility] at  [place]  A.M.   Call this number if you have problems the morning of surgery:  [phone]   Remember:  Follow the diet and prep instructions given to you by the office.                     Take these medicines the morning of surgery with A SIP OF WATER   Zyrtec.     Please brush your teeth.  Do not wear jewelry, make-up or nail polish.  Do not wear lotions, powders, or perfumes, or deodorant.  Do not shave 48 hours prior to surgery.  Men may shave face and neck.  Do not bring valuables to the hospital.  [facility] is not responsible for any belongings or valuables.  Contacts, dentures or bridgework may not be worn into surgery.  Leave your suitcase in the car.  After surgery it may be brought to your room.  For patients admitted to the hospital, discharge time will be determined by your treatment team.  Patients discharged the day of surgery will not be allowed to drive home and must have someone with them for 24 hours.    Special instructions:  DO NOT smoke tobacco or vape for 24 hours before your procedure.  Please read over the following fact sheets that you were given. Anesthesia Post-op Instructions and Care and Recovery After Surgery       Colonoscopy, Adult, Care After This sheet gives you information about how to care for yourself after your procedure. Your health care provider may also give you more specific instructions. If you have problems or questions, contact your health care provider. What can I expect after the procedure? After the procedure, it is common to have:  A small amount of blood in your stool for 24 hours after the procedure.  Some gas.  Mild cramping or bloating of your abdomen. Follow these instructions at home: Eating and drinking  Drink enough fluid to keep your urine pale yellow.  Follow  instructions from your health care provider about eating or drinking restrictions.  Resume your normal diet as instructed by your health care provider. Avoid heavy or fried foods that are hard to digest.   Activity  Rest as told by your health care provider.  Avoid sitting for a long time without moving. Get up to take short walks every 1-2 hours. This is important to improve blood flow and breathing. Ask for help if you feel weak or unsteady.  Return to your normal activities as told by your health care provider. Ask your health care provider what activities are safe for you. Managing cramping and bloating  Try walking around when you have cramps or feel bloated.  Apply heat to your abdomen as told by your health care provider. Use the heat source that your health care provider recommends, such as a moist heat pack or a heating pad. ? Place a towel between your skin and the heat source. ? Leave the heat on for 20-30 minutes. ? Remove the heat if your skin turns bright red. This is especially important if you are unable to feel pain, heat, or cold. You may have a greater risk of getting burned.   General instructions  If you were given a sedative during the procedure, it can affect  you for several hours. Do not drive or operate machinery until your health care provider says that it is safe.  For the first 24 hours after the procedure: ? Do not sign important documents. ? Do not drink alcohol. ? Do your regular daily activities at a slower pace than normal. ? Eat soft foods that are easy to digest.  Take over-the-counter and prescription medicines only as told by your health care provider.  Keep all follow-up visits as told by your health care provider. This is important. Contact a health care provider if:  You have blood in your stool 2-3 days after the procedure. Get help right away if you have:  More than a small spotting of blood in your stool.  Large blood clots in your  stool.  Swelling of your abdomen.  Nausea or vomiting.  A fever.  Increasing pain in your abdomen that is not relieved with medicine. Summary  After the procedure, it is common to have a small amount of blood in your stool. You may also have mild cramping and bloating of your abdomen.  If you were given a sedative during the procedure, it can affect you for several hours. Do not drive or operate machinery until your health care provider says that it is safe.  Get help right away if you have a lot of blood in your stool, nausea or vomiting, a fever, or increased pain in your abdomen. This information is not intended to replace advice given to you by your health care provider. Make sure you discuss any questions you have with your health care provider. Document Revised: 04/07/2019 Document Reviewed: 11/07/2018 Elsevier Patient Education  2021 [place] After This sheet gives you information about how to care for yourself after your procedure. Your health care provider may also give you more specific instructions. If you have problems or questions, contact your health care provider. What can I expect after the procedure? After the procedure, it is common to have:  Tiredness.  Forgetfulness about what happened after the procedure.  Impaired judgment for important decisions.  Nausea or vomiting.  Some difficulty with balance. Follow these instructions at home: For the time period you were told by your health care provider:  Rest as needed.  Do not participate in activities where you could fall or become injured.  Do not drive or use machinery.  Do not drink alcohol.  Do not take sleeping pills or medicines that cause drowsiness.  Do not make important decisions or sign legal documents.  Do not take care of children on your own.      Eating and drinking  Follow the diet that is recommended by your health care provider.  Drink  enough fluid to keep your urine pale yellow.  If you vomit: ? Drink water, juice, or soup when you can drink without vomiting. ? Make sure you have little or no nausea before eating solid foods. General instructions  Have a responsible adult stay with you for the time you are told. It is important to have someone help care for you until you are awake and alert.  Take over-the-counter and prescription medicines only as told by your health care provider.  If you have sleep apnea, surgery and certain medicines can increase your risk for breathing problems. Follow instructions from your health care provider about wearing your sleep device: ? Anytime you are sleeping, including during daytime naps. ? While taking prescription pain medicines, sleeping medicines,  or medicines that make you drowsy.  Avoid smoking.  Keep all follow-up visits as told by your health care provider. This is important. Contact a health care provider if:  You keep feeling nauseous or you keep vomiting.  You feel light-headed.  You are still sleepy or having trouble with balance after 24 hours.  You develop a rash.  You have a fever.  You have redness or swelling around the IV site. Get help right away if:  You have trouble breathing.  You have new-onset confusion at home. Summary  For several hours after your procedure, you may feel tired. You may also be forgetful and have poor judgment.  Have a responsible adult stay with you for the time you are told. It is important to have someone help care for you until you are awake and alert.  Rest as told. Do not drive or operate machinery. Do not drink alcohol or take sleeping pills.  Get help right away if you have trouble breathing, or if you suddenly become confused. This information is not intended to replace advice given to you by your health care provider. Make sure you discuss any questions you have with your health care provider. Document Revised:  12/28/2019 Document Reviewed: 03/16/2019 Elsevier Patient Education  2021 [employer].

## 2020-09-06 ENCOUNTER — Encounter (HOSPITAL_COMMUNITY)
Admission: RE | Admit: 2020-09-06 | Discharge: 2020-09-06 | Disposition: A | Discharge status: Home / Self Care | Payer: BC Managed Care – PPO | Source: Ambulatory Visit | Attending: Internal Medicine | Admitting: Internal Medicine

## 2020-09-06 ENCOUNTER — Other Ambulatory Visit (HOSPITAL_COMMUNITY)
Admission: RE | Admit: 2020-09-06 | Discharge: 2020-09-06 | Disposition: A | Discharge status: Home / Self Care | Payer: BC Managed Care – PPO | Source: Ambulatory Visit | Attending: Internal Medicine | Admitting: Internal Medicine

## 2020-09-06 ENCOUNTER — Encounter (HOSPITAL_COMMUNITY): Payer: Self-pay

## 2020-09-06 ENCOUNTER — Other Ambulatory Visit: Payer: Self-pay

## 2020-09-06 DIAGNOSIS — Z20822 Contact with and (suspected) exposure to covid-19: Secondary | ICD-10-CM | POA: Insufficient documentation

## 2020-09-06 DIAGNOSIS — Z01812 Encounter for preprocedural laboratory examination: Secondary | ICD-10-CM | POA: Insufficient documentation

## 2020-09-06 LAB — PREGNANCY, URINE: Preg Test, Ur: NEGATIVE

## 2020-09-07 LAB — SARS CORONAVIRUS 2 (TAT 6-24 HRS): SARS Coronavirus 2: NEGATIVE

## 2020-09-10 ENCOUNTER — Ambulatory Visit (HOSPITAL_COMMUNITY): Payer: BC Managed Care – PPO | Admitting: Certified Registered"

## 2020-09-10 ENCOUNTER — Encounter (HOSPITAL_COMMUNITY): Payer: Self-pay

## 2020-09-10 ENCOUNTER — Encounter (HOSPITAL_COMMUNITY)
Admission: RE | Disposition: A | Discharge status: Home / Self Care | Payer: Self-pay | Source: Home / Self Care | Attending: Internal Medicine

## 2020-09-10 ENCOUNTER — Other Ambulatory Visit: Payer: Self-pay

## 2020-09-10 ENCOUNTER — Ambulatory Visit (HOSPITAL_COMMUNITY)
Admission: RE | Admit: 2020-09-10 | Discharge: 2020-09-10 | Disposition: A | Discharge status: Home / Self Care | Payer: BC Managed Care – PPO | Attending: Internal Medicine | Admitting: Internal Medicine

## 2020-09-10 DIAGNOSIS — Z87891 Personal history of nicotine dependence: Secondary | ICD-10-CM | POA: Diagnosis not present

## 2020-09-10 DIAGNOSIS — Z79899 Other long term (current) drug therapy: Secondary | ICD-10-CM | POA: Diagnosis not present

## 2020-09-10 DIAGNOSIS — Z1211 Encounter for screening for malignant neoplasm of colon: Secondary | ICD-10-CM | POA: Diagnosis not present

## 2020-09-10 DIAGNOSIS — D175 Benign lipomatous neoplasm of intra-abdominal organs: Secondary | ICD-10-CM | POA: Insufficient documentation

## 2020-09-10 DIAGNOSIS — K635 Polyp of colon: Secondary | ICD-10-CM

## 2020-09-10 DIAGNOSIS — Z9884 Bariatric surgery status: Secondary | ICD-10-CM | POA: Diagnosis not present

## 2020-09-10 DIAGNOSIS — K648 Other hemorrhoids: Secondary | ICD-10-CM | POA: Diagnosis not present

## 2020-09-10 HISTORY — PX: POLYPECTOMY: SHX5525

## 2020-09-10 HISTORY — PX: COLONOSCOPY WITH PROPOFOL: SHX5780

## 2020-09-10 LAB — GLUCOSE, CAPILLARY: Glucose-Capillary: 80 mg/dL (ref 70–99)

## 2020-09-10 SURGERY — COLONOSCOPY WITH PROPOFOL
Anesthesia: General

## 2020-09-10 MED ORDER — LIDOCAINE HCL (CARDIAC) PF 100 MG/5ML IV SOSY
PREFILLED_SYRINGE | INTRAVENOUS | Status: DC | PRN
  Administered 2020-09-10: 100 mg via INTRAVENOUS

## 2020-09-10 MED ORDER — STERILE WATER FOR IRRIGATION IR SOLN
Status: DC | PRN
  Administered 2020-09-10: 1.5 mL

## 2020-09-10 MED ORDER — PROPOFOL 500 MG/50ML IV EMUL
INTRAVENOUS | Status: DC | PRN
  Administered 2020-09-10: 100 mg via INTRAVENOUS
  Administered 2020-09-10: 125 ug/kg/min via INTRAVENOUS

## 2020-09-10 MED ORDER — LIDOCAINE HCL (PF) 2 % IJ SOLN
INTRAMUSCULAR | Status: AC
  Filled 2020-09-10: qty 5

## 2020-09-10 MED ORDER — PROPOFOL 10 MG/ML IV BOLUS
INTRAVENOUS | Status: AC
  Filled 2020-09-10: qty 40

## 2020-09-10 MED ORDER — PROPOFOL 10 MG/ML IV BOLUS
INTRAVENOUS | Status: AC
  Filled 2020-09-10: qty 20

## 2020-09-10 MED ORDER — LACTATED RINGERS IV SOLN
INTRAVENOUS | Status: DC
  Administered 2020-09-10: 1000 mL via INTRAVENOUS

## 2020-09-10 NOTE — Anesthesia Preprocedure Evaluation (Signed)
Anesthesia Evaluation  Patient identified by MRN, date of birth, ID band Patient awake    Reviewed: Allergy & Precautions, H&P , NPO status , Patient's Chart, lab work & pertinent test results, reviewed documented beta blocker date and time   Airway Mallampati: II  TM Distance: >3 FB Neck ROM: full    Dental no notable dental hx.    Pulmonary sleep apnea , former smoker,    Pulmonary exam normal breath sounds clear to auscultation       Cardiovascular Exercise Tolerance: Good hypertension, negative cardio ROS   Rhythm:regular Rate:Normal     Neuro/Psych  Neuromuscular disease negative psych ROS   GI/Hepatic negative GI ROS, Neg liver ROS,   Endo/Other  negative endocrine ROSdiabetes, Well Controlled, Type 2  Renal/GU negative Renal ROS  negative genitourinary   Musculoskeletal   Abdominal   Peds  Hematology negative hematology ROS (+)   Anesthesia Other Findings   Reproductive/Obstetrics negative OB ROS                             Anesthesia Physical Anesthesia Plan  ASA: III  Anesthesia Plan: General   Post-op Pain Management:    Induction:   PONV Risk Score and Plan: Propofol infusion  Airway Management Planned:   Additional Equipment:   Intra-op Plan:   Post-operative Plan:   Informed Consent: I have reviewed the patients History and Physical, chart, labs and discussed the procedure including the risks, benefits and alternatives for the proposed anesthesia with the patient or authorized representative who has indicated his/her understanding and acceptance.     Dental Advisory Given  Plan Discussed with: CRNA  Anesthesia Plan Comments:         Anesthesia Quick Evaluation

## 2020-09-10 NOTE — Op Note (Signed)
[facility] Patient Name: Crystal Escobar Procedure Date: 09/10/2020 8:14 AM MRN: [id number] Date of Birth: 1970/09/03 Attending MD: Elon Alas. Abbey Chatters DO CSN: [id number] Age: 50 Admit Type: Outpatient Procedure:                Colonoscopy Indications:              Screening for colorectal malignant neoplasm Providers:                Elon Alas. Abbey Chatters, DO, Caprice Kluver, Tammy Vaught,                            RN, Raphael Gibney, Technician Referring MD:              Medicines:                See the Anesthesia note for documentation of the                            administered medications Complications:            No immediate complications. Estimated Blood Loss:     Estimated blood loss was minimal. Procedure:                Pre-Anesthesia Assessment:                           - The anesthesia plan was to use monitored                            anesthesia care (MAC).                           After obtaining informed consent, the colonoscope                            was passed under direct vision. Throughout the                            procedure, the patient's blood pressure, pulse, and                            oxygen saturations were monitored continuously. The                            [id number]-H190DL ([id number]) scope was introduced through                            the anus and advanced to the the cecum, identified                            by appendiceal orifice and ileocecal valve. The                            colonoscopy was performed without difficulty. The                            patient tolerated the procedure  well. The quality                            of the bowel preparation was evaluated using the                            BBPS [facility] Bowel Preparation Scale) with scores                            of: Right Colon = 2 (minor amount of residual                            staining, small fragments of stool and/or opaque                            liquid,  but mucosa seen well), Transverse Colon = 3                            (entire mucosa seen well with no residual staining,                            small fragments of stool or opaque liquid) and Left                            Colon = 3 (entire mucosa seen well with no residual                            staining, small fragments of stool or opaque                            liquid). The total BBPS score equals 8. The quality                            of the bowel preparation was good. Scope In: 8:25:55 AM Scope Out: 8:41:34 AM Scope Withdrawal Time: 0 hours 11 minutes 49 seconds  Total Procedure Duration: 0 hours 15 minutes 39 seconds  Findings:      The perianal and digital rectal examinations were normal.      Non-bleeding internal hemorrhoids were found during endoscopy.      A 7 mm polyp was found in the descending colon. The polyp was sessile.       The polyp was removed with a cold snare. Resection and retrieval were       complete.      The exam was otherwise without abnormality. Impression:               - Non-bleeding internal hemorrhoids.                           - One 7 mm polyp in the descending colon, removed                            with a cold snare. Resected and retrieved.                           -  The examination was otherwise normal. Moderate Sedation:      Per Anesthesia Care Recommendation:           - Patient has a contact number available for                            emergencies. The signs and symptoms of potential                            delayed complications were discussed with the                            patient. Return to normal activities tomorrow.                            Written discharge instructions were provided to the                            patient.                           - Resume previous diet.                           - Continue present medications.                           - Await pathology results.                            - Repeat colonoscopy in 5 years for surveillance.                           - Return to GI clinic PRN. Procedure Code(s):        --- Professional ---                           [phone], Colonoscopy, flexible; with removal of                            tumor(s), polyp(s), or other lesion(s) by snare                            technique Diagnosis Code(s):        --- Professional ---                           Z12.11, Encounter for screening for malignant                            neoplasm of colon                           K63.5, Polyp of colon                           K64.8, Other hemorrhoids CPT copyright 2019 American Medical Association. All rights  reserved. The codes documented in this report are preliminary and upon coder review may  be revised to meet current compliance requirements. Elon Alas. Abbey Chatters, DO [place] Abbey Chatters, DO 09/10/2020 8:43:47 AM This report has been signed electronically. Number of Addenda: 0

## 2020-09-10 NOTE — Transfer of Care (Signed)
Immediate Anesthesia Transfer of Care Note  Patient: Crystal Escobar  Procedure(s) Performed: COLONOSCOPY WITH PROPOFOL (N/A ) POLYPECTOMY  Patient Location: Short Stay  Anesthesia Type:General  Level of Consciousness: awake, alert , oriented and patient cooperative  Airway & Oxygen Therapy: Patient Spontanous Breathing  Post-op Assessment: Report given to RN, Post -op Vital signs reviewed and stable and Patient moving all extremities  Post vital signs: Reviewed and stable  Last Vitals:  Vitals Value Taken Time  BP 119/72 09/10/20 0845  Temp 36.6 C 09/10/20 0845  Pulse 60 09/10/20 0845  Resp 18 09/10/20 0845  SpO2 98 % 09/10/20 0845    Last Pain:  Vitals:   09/10/20 0845  TempSrc: Oral  PainSc: 0-No pain      Patients Stated Pain Goal: 6 (23/36/12 2449)  Complications: No complications documented.

## 2020-09-10 NOTE — H&P (Signed)
Primary Care Physician:  Glendale Chard, MD Primary Gastroenterologist:  Dr. Abbey Chatters  Pre-Procedure History & Physical: HPI:  Crystal Escobar is a 50 y.o. female is here for a colonoscopy for colon cancer screening purposes.  Patient denies any family history of colorectal cancer.  No melena or hematochezia.  No abdominal pain or unintentional weight loss.  No change in bowel habits.  Overall feels well from a GI standpoint.  Past Medical History:  Diagnosis Date  . Abnormal Pap smear 2011  . Diabetes mellitus   . DUB (dysfunctional uterine bleeding) 12/22/2012  . HLD (hyperlipidemia)   . Hypertension   . Sleep apnea    does not use machine as prescribed    Past Surgical History:  Procedure Laterality Date  . CARPAL TUNNEL RELEASE Right 2016  . CESAREAN SECTION    . 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 Shawna Orleans  . KNEE SURGERY Left    cartilage  . TUBAL LIGATION      Prior to Admission medications   Medication Sig Start Date End Date Taking? Authorizing Provider  atorvastatin (LIPITOR) 20 MG tablet Take 1 tablet by mouth once daily Patient taking differently: Take 20 mg by mouth daily. 05/21/20  Yes Glendale Chard, MD  cetirizine (ZYRTEC) 10 MG tablet Take 10 mg by mouth daily as needed for allergies.   Yes [provider]  Cholecalciferol (VITAMIN D3) 125 MCG (5000 UT) CAPS Take 5,000 Units by mouth daily.   Yes [provider]  losartan (COZAAR) 50 MG tablet Take 1 tablet by mouth once daily Patient taking differently: Take 50 mg by mouth daily. 04/15/20  Yes Glendale Chard, MD  Multiple Vitamin (MULTIVITAMIN) capsule Take 1 capsule by mouth daily.   Yes [provider]  polyethylene glycol-electrolytes (TRILYTE) 420 g solution Take 4,000 mLs by mouth as directed. 07/09/20  Yes Eloise Harman, DO  Probiotic Product (PROBIOTIC PO) Take 1 capsule by mouth daily.   Yes [provider]  Semaglutide (RYBELSUS) 7 MG TABS Take 7 mg by mouth daily.  05/09/20  Yes Glendale Chard, MD  nystatin (NYSTATIN) powder Apply 1 application topically 3 (three) times daily. Patient not taking: Reported on 09/02/2020 08/08/20   Glendale Chard, MD    Allergies as of 07/09/2020  . (No Known Allergies)    Family History  Problem Relation Age of Onset  . Seizures Father   . Heart attack Mother   . Hypertension Mother   . Diabetes Mother   . Hypertension Sister   . Heart murmur Son   . Lung cancer Maternal Grandmother   . Sickle cell anemia Maternal Aunt   . Hypertension Maternal Aunt   . Colon cancer Neg Hx     Social History   Socioeconomic History  . Marital status: Married    Spouse name: Not on file  . Number of children: Not on file  . Years of education: Not on file  . Highest education level: Not on file  Occupational History  . Not on file  Tobacco Use  . Smoking status: Former Smoker    Packs/day: 1.00    Years: 10.00    Pack years: 10.00    Types: Cigars    Quit date: 06/03/2018    Years since quitting: 2.2  . Smokeless tobacco: Never Used  . Tobacco comment: 1 cigar a day  Vaping Use  . Vaping Use: Never used  Substance and Sexual Activity  . Alcohol use: Yes  Comment: occasionally   . Drug use: No  . Sexual activity: Yes    Partners: Male    Birth control/protection: Surgical    Comment: BTL  Other Topics Concern  . Not on file  Social History Narrative  . Not on file   Social Determinants of Health   Financial Resource Strain: Not on file  Food Insecurity: Not on file  Transportation Needs: Not on file  Physical Activity: Not on file  Stress: Not on file  Social Connections: Not on file  Intimate Partner Violence: Not on file    Review of Systems: See HPI, otherwise negative ROS  Physical Exam: Vital signs in last 24 hours: Temp:  [97.8 F (36.6 C)] 97.8 F (36.6 C) (05/17 0721) Pulse Rate:  [56] 56 (05/17 0721) Resp:  [18] 18 (05/17 0721) BP: (139)/(80) 139/80 (05/17 0721) SpO2:  [100 %]  100 % (05/17 0721) Weight:  [136.1 kg] 136.1 kg (05/17 0721)   General:   Alert,  Well-developed, well-nourished, pleasant and cooperative in NAD Head:  Normocephalic and atraumatic. Eyes:  Sclera clear, no icterus.   Conjunctiva pink. Ears:  Normal auditory acuity. Nose:  No deformity, discharge,  or lesions. Mouth:  No deformity or lesions, dentition normal. Neck:  Supple; no masses or thyromegaly. Lungs:  Clear throughout to auscultation.   No wheezes, crackles, or rhonchi. No acute distress. Heart:  Regular rate and rhythm; no murmurs, clicks, rubs,  or gallops. Abdomen:  Soft, nontender and nondistended. No masses, hepatosplenomegaly or hernias noted. Normal bowel sounds, without guarding, and without rebound.   Msk:  Symmetrical without gross deformities. Normal posture. Extremities:  Without clubbing or edema. Neurologic:  Alert and  oriented x4;  grossly normal neurologically. Skin:  Intact without significant lesions or rashes. Cervical Nodes:  No significant cervical adenopathy. Psych:  Alert and cooperative. Normal mood and affect.  Impression/Plan: Crystal Escobar is here for a colonoscopy to be performed for colon cancer screening purposes.  The risks of the procedure including infection, bleed, or perforation as well as benefits, limitations, alternatives and imponderables have been reviewed with the patient. Questions have been answered. All parties agreeable.

## 2020-09-10 NOTE — Anesthesia Postprocedure Evaluation (Signed)
Anesthesia Post Note  Patient: Crystal Escobar  Procedure(s) Performed: COLONOSCOPY WITH PROPOFOL (N/A ) POLYPECTOMY  Patient location during evaluation: Phase II Anesthesia Type: General Level of consciousness: awake Pain management: pain level controlled Vital Signs Assessment: post-procedure vital signs reviewed and stable Respiratory status: spontaneous breathing and respiratory function stable Cardiovascular status: blood pressure returned to baseline and stable Postop Assessment: no headache and no apparent nausea or vomiting Anesthetic complications: no Comments: Late entry   No complications documented.   Last Vitals:  Vitals:   09/10/20 0721 09/10/20 0845  BP: 139/80 119/72  Pulse: (!) 56 60  Resp: 18 18  Temp: 36.6 C 36.6 C  SpO2: 100% 98%    Last Pain:  Vitals:   09/10/20 0845  TempSrc: Oral  PainSc: 0-No pain                 Louann Sjogren

## 2020-09-10 NOTE — Anesthesia Procedure Notes (Addendum)
Performed by: Tacy Learn, CRNA Oxygen Delivery Method: Nasal cannula Induction Type: IV induction

## 2020-09-10 NOTE — Discharge Instructions (Addendum)
Colonoscopy, Adult, Care After This sheet gives you information about how to care for yourself after your procedure. Your health care provider may also give you more specific instructions. If you have problems or questions, contact your health care provider. What can I expect after the procedure? After the procedure, it is common to have:  A small amount of blood in your stool for 24 hours after the procedure.  Some gas.  Mild cramping or bloating of your abdomen. Follow these instructions at home: Eating and drinking  Drink enough fluid to keep your urine pale yellow.  Follow instructions from your health care provider about eating or drinking restrictions.  Resume your normal diet as instructed by your health care provider. Avoid heavy or fried foods that are hard to digest.   Activity  Rest as told by your health care provider.  Avoid sitting for a long time without moving. Get up to take short walks every 1-2 hours. This is important to improve blood flow and breathing. Ask for help if you feel weak or unsteady.  Return to your normal activities as told by your health care provider. Ask your health care provider what activities are safe for you. Managing cramping and bloating  Try walking around when you have cramps or feel bloated.  Apply heat to your abdomen as told by your health care provider. Use the heat source that your health care provider recommends, such as a moist heat pack or a heating pad. ? Place a towel between your skin and the heat source. ? Leave the heat on for 20-30 minutes. ? Remove the heat if your skin turns bright red. This is especially important if you are unable to feel pain, heat, or cold. You may have a greater risk of getting burned.   General instructions  If you were given a sedative during the procedure, it can affect you for several hours. Do not drive or operate machinery until your health care provider says that it is safe.  For the first  24 hours after the procedure: ? Do not sign important documents. ? Do not drink alcohol. ? Do your regular daily activities at a slower pace than normal. ? Eat soft foods that are easy to digest.  Take over-the-counter and prescription medicines only as told by your health care provider.  Keep all follow-up visits as told by your health care provider. This is important. Contact a health care provider if:  You have blood in your stool 2-3 days after the procedure. Get help right away if you have:  More than a small spotting of blood in your stool.  Large blood clots in your stool.  Swelling of your abdomen.  Nausea or vomiting.  A fever.  Increasing pain in your abdomen that is not relieved with medicine. Summary  After the procedure, it is common to have a small amount of blood in your stool. You may also have mild cramping and bloating of your abdomen.  If you were given a sedative during the procedure, it can affect you for several hours. Do not drive or operate machinery until your health care provider says that it is safe.  Get help right away if you have a lot of blood in your stool, nausea or vomiting, a fever, or increased pain in your abdomen. This information is not intended to replace advice given to you by your health care provider. Make sure you discuss any questions you have with your health care provider. Document Revised:  04/07/2019 Document Reviewed: 11/07/2018 Elsevier Patient Education  [place]. Colon Polyps  Colon polyps are tissue growths inside the colon, which is part of the large intestine. They are one of the types of polyps that can grow in the body. A polyp may be a round bump or a mushroom-shaped growth. You could have one polyp or more than one. Most colon polyps are noncancerous (benign). However, some colon polyps can become cancerous over time. Finding and removing the polyps early can help prevent this. What are the causes? The exact  cause of colon polyps is not known. What increases the risk? The following factors may make you more likely to develop this condition:  Having a family history of colorectal cancer or colon polyps.  Being older than 48 years of age.  Being younger than 48 years of age and having a significant family history of colorectal cancer or colon polyps or a genetic condition that puts you at higher risk of getting colon polyps.  Having inflammatory bowel disease, such as ulcerative colitis or Crohn's disease.  Having certain conditions passed from parent to child (hereditary conditions), such as: ? Familial adenomatous polyposis (FAP). ? Lynch syndrome. ? Turcot syndrome. ? Peutz-Jeghers syndrome. ? MUTYH-associated polyposis (MAP).  Being overweight.  Certain lifestyle factors. These include smoking cigarettes, drinking too much alcohol, not getting enough exercise, and eating a diet that is high in fat and red meat and low in fiber.  Having had childhood cancer that was treated with radiation of the abdomen. What are the signs or symptoms? Many times, there are no symptoms. If you have symptoms, they may include:  Blood coming from the rectum during a bowel movement.  Blood in the stool (feces). The blood may be bright red or very dark in color.  Pain in the abdomen.  A change in bowel habits, such as constipation or diarrhea. How is this diagnosed? This condition is diagnosed with a colonoscopy. This is a procedure in which a lighted, flexible scope is inserted into the opening between the buttocks (anus) and then passed into the colon to examine the area. Polyps are sometimes found when a colonoscopy is done as part of routine cancer screening tests. How is this treated? This condition is treated by removing any polyps that are found. Most polyps can be removed during a colonoscopy. Those polyps will then be tested for cancer. Additional treatment may be needed depending on the  results of testing. Follow these instructions at home: Eating and drinking  Eat foods that are high in fiber, such as fruits, vegetables, and whole grains.  Eat foods that are high in calcium and vitamin D, such as milk, cheese, yogurt, eggs, liver, fish, and broccoli.  Limit foods that are high in fat, such as fried foods and desserts.  Limit the amount of red meat, precooked or cured meat, or other processed meat that you eat, such as hot dogs, sausages, bacon, or meat loaves.  Limit sugary drinks.   Lifestyle  Maintain a healthy weight, or lose weight if recommended by your health care provider.  Exercise every day or as told by your health care provider.  Do not use any products that contain nicotine or tobacco, such as cigarettes, e-cigarettes, and chewing tobacco. If you need help quitting, ask your health care provider.  Do not drink alcohol if: ? Your health care provider tells you not to drink. ? You are pregnant, may be pregnant, or are planning to become pregnant.  If you drink alcohol: ? Limit how much you use to:  0-1 drink a day for women.  0-2 drinks a day for men. ? Know how much alcohol is in your drink. In the [place] one drink equals one 12 oz bottle of beer (355 mL), one 5 oz glass of wine (148 mL), or one 1 oz glass of hard liquor (44 mL). General instructions  Take over-the-counter and prescription medicines only as told by your health care provider.  Keep all follow-up visits. This is important. This includes having regularly scheduled colonoscopies. Talk to your health care provider about when you need a colonoscopy. Contact a health care provider if:  You have new or worsening bleeding during a bowel movement.  You have new or increased blood in your stool.  You have a change in bowel habits.  You lose weight for no known reason. Summary  Colon polyps are tissue growths inside the colon, which is part of the large intestine. They are one type of  polyp that can grow in the body.  Most colon polyps are noncancerous (benign), but some can become cancerous over time.  This condition is diagnosed with a colonoscopy.  This condition is treated by removing any polyps that are found. Most polyps can be removed during a colonoscopy. This information is not intended to replace advice given to you by your health care provider. Make sure you discuss any questions you have with your health care provider. Document Revised: 08/02/2019 Document Reviewed: 08/02/2019 Elsevier Patient Education  2021 [place].  Colonoscopy Discharge Instructions  Read the instructions outlined below and refer to this sheet in the next few weeks. These discharge instructions provide you with general information on caring for yourself after you leave the hospital. Your doctor may also give you specific instructions. While your treatment has been planned according to the most current medical practices available, unavoidable complications occasionally occur.   ACTIVITY  You may resume your regular activity, but move at a slower pace for the next 24 hours.   Take frequent rest periods for the next 24 hours.   Walking will help get rid of the air and reduce the bloated feeling in your belly (abdomen).   No driving for 24 hours (because of the medicine (anesthesia) used during the test).    Do not sign any important legal documents or operate any machinery for 24 hours (because of the anesthesia used during the test).  NUTRITION  Drink plenty of fluids.   You may resume your normal diet as instructed by your doctor.   Begin with a light meal and progress to your normal diet. Heavy or fried foods are harder to digest and may make you feel sick to your stomach (nauseated).   Avoid alcoholic beverages for 24 hours or as instructed.  MEDICATIONS  You may resume your normal medications unless your doctor tells you otherwise.  WHAT YOU CAN EXPECT TODAY  Some  feelings of bloating in the abdomen.   Passage of more gas than usual.   Spotting of blood in your stool or on the toilet paper.  IF YOU HAD POLYPS REMOVED DURING THE COLONOSCOPY:  No aspirin products for 7 days or as instructed.   No alcohol for 7 days or as instructed.   Eat a soft diet for the next 24 hours.  FINDING OUT THE RESULTS OF YOUR TEST Not all test results are available during your visit. If your test results are not back during the visit,  make an appointment with your caregiver to find out the results. Do not assume everything is normal if you have not heard from your caregiver or the medical facility. It is important for you to follow up on all of your test results.  SEEK IMMEDIATE MEDICAL ATTENTION IF:  You have more than a spotting of blood in your stool.   Your belly is swollen (abdominal distention).   You are nauseated or vomiting.   You have a temperature over 101.   You have abdominal pain or discomfort that is severe or gets worse throughout the day.   Your colonoscopy revealed 1 polyp(s) which I removed successfully. Await pathology results, my office will contact you. I recommend repeating colonoscopy in 5 years for surveillance purposes. Otherwise follow up with GI as needed.    I hope you have a great rest of your week!  Elon Alas. Abbey Chatters, D.O. Gastroenterology and Hepatology [facility]

## 2020-09-12 ENCOUNTER — Encounter: Payer: Self-pay | Admitting: Internal Medicine

## 2020-09-12 LAB — SURGICAL PATHOLOGY

## 2020-09-16 ENCOUNTER — Encounter (HOSPITAL_COMMUNITY): Payer: Self-pay | Admitting: Internal Medicine

## 2020-10-04 NOTE — Progress Notes (Signed)
Recall placed

## 2020-11-07 ENCOUNTER — Ambulatory Visit: Payer: BC Managed Care – PPO | Admitting: Internal Medicine

## 2020-11-12 ENCOUNTER — Other Ambulatory Visit: Payer: Self-pay

## 2020-11-12 ENCOUNTER — Ambulatory Visit: Payer: BC Managed Care – PPO | Admitting: Internal Medicine

## 2020-11-12 ENCOUNTER — Encounter: Payer: Self-pay | Admitting: Internal Medicine

## 2020-11-12 VITALS — BP 120/82 | HR 68 | Temp 98.6°F | Ht 62.0 in | Wt 303.0 lb

## 2020-11-12 DIAGNOSIS — E1165 Type 2 diabetes mellitus with hyperglycemia: Secondary | ICD-10-CM

## 2020-11-12 DIAGNOSIS — Z6841 Body Mass Index (BMI) 40.0 and over, adult: Secondary | ICD-10-CM

## 2020-11-12 DIAGNOSIS — M79601 Pain in right arm: Secondary | ICD-10-CM | POA: Diagnosis not present

## 2020-11-12 DIAGNOSIS — I10 Essential (primary) hypertension: Secondary | ICD-10-CM

## 2020-11-12 DIAGNOSIS — R202 Paresthesia of skin: Secondary | ICD-10-CM

## 2020-11-12 DIAGNOSIS — M79602 Pain in left arm: Secondary | ICD-10-CM

## 2020-11-12 MED ORDER — CETIRIZINE HCL 10 MG PO TABS: 10.0000 mg | ORAL_TABLET | Freq: Every day | ORAL | 4 refills | Status: DC | PRN

## 2020-11-12 NOTE — Patient Instructions (Signed)
Exercising to Lose Weight Exercise is structured, repetitive physical activity to improve fitness and health. Getting regular exercise is important for everyone. It is especially important if you are overweight. Being overweight increases your risk of heart disease, stroke, diabetes, high blood pressure, and several types of cancer.Reducing your calorie intake and exercising can help you lose weight. Exercise is usually categorized as moderate or vigorous intensity. To lose weight, most people need to do a certain amount of moderate-intensity orvigorous-intensity exercise each week. Moderate-intensity exercise  Moderate-intensity exercise is any activity that gets you moving enough to burn at least three times more energy (calories) than if you were sitting. Examples of moderate exercise include: Walking a mile in 15 minutes. Doing light yard work. Biking at an easy pace. Most people should get at least 150 minutes (2 hours and 30 minutes) a week ofmoderate-intensity exercise to maintain their body weight. Vigorous-intensity exercise Vigorous-intensity exercise is any activity that gets you moving enough to burn at least six times more calories than if you were sitting. When you exercise at this intensity, you should be working hard enough that you are not able tocarry on a conversation. Examples of vigorous exercise include: Running. Playing a team sport, such as football, basketball, and soccer. Jumping rope. Most people should get at least 75 minutes (1 hour and 15 minutes) a week ofvigorous-intensity exercise to maintain their body weight. How can exercise affect me? When you exercise enough to burn more calories than you eat, you lose weight. Exercise also reduces body fat and builds muscle. The more muscle you have, the more calories you burn. Exercise also: Improves mood. Reduces stress and tension. Improves your overall fitness, flexibility, and endurance. Increases bone strength. The  amount of exercise you need to lose weight depends on: Your age. The type of exercise. Any health conditions you have. Your overall physical ability. Talk to your health care provider about how much exercise you need and whattypes of activities are safe for you. What actions can I take to lose weight? Nutrition  Make changes to your diet as told by your health care provider or diet and nutrition specialist (dietitian). This may include: Eating fewer calories. Eating more protein. Eating less unhealthy fats. Eating a diet that includes fresh fruits and vegetables, whole grains, low-fat dairy products, and lean protein. Avoiding foods with added fat, salt, and sugar. Drink plenty of water while you exercise to prevent dehydration or heat stroke.  Activity Choose an activity that you enjoy and set realistic goals. Your health care provider can help you make an exercise plan that works for you. Exercise at a moderate or vigorous intensity most days of the week. The intensity of exercise may vary from person to person. You can tell how intense a workout is for you by paying attention to your breathing and heartbeat. Most people will notice their breathing and heartbeat get faster with more intense exercise. Do resistance training twice each week, such as: Push-ups. Sit-ups. Lifting weights. Using resistance bands. Getting short amounts of exercise can be just as helpful as long structured periods of exercise. If you have trouble finding time to exercise, try to include exercise in your daily routine. Get up, stretch, and walk around every 30 minutes throughout the day. Go for a walk during your lunch break. Park your car farther away from your destination. If you take public transportation, get off one stop early and walk the rest of the way. Make phone calls while standing up and   walking around. Take the stairs instead of elevators or escalators. Wear comfortable clothes and shoes with  good support. Do not exercise so much that you hurt yourself, feel dizzy, or get very short of breath. Where to find more information [place] Department of Health and Human Services: www.hhs.gov Centers for Disease Control and Prevention (CDC): www.cdc.gov Contact a health care provider: Before starting a new exercise program. If you have questions or concerns about your weight. If you have a medical problem that keeps you from exercising. Get help right away if you have any of the following while exercising: Injury. Dizziness. Difficulty breathing or shortness of breath that does not go away when you stop exercising. Chest pain. Rapid heartbeat. Summary Being overweight increases your risk of heart disease, stroke, diabetes, high blood pressure, and several types of cancer. Losing weight happens when you burn more calories than you eat. Reducing the amount of calories you eat in addition to getting regular moderate or vigorous exercise each week helps you lose weight. This information is not intended to replace advice given to you by your health care provider. Make sure you discuss any questions you have with your healthcare provider. Document Revised: 07/24/2019 Document Reviewed: 08/10/2019 Elsevier Patient Education  2022 Elsevier Inc.  

## 2020-11-12 NOTE — Progress Notes (Signed)
I,Crystal Escobar,acting as a Education administrator for Crystal Greenland, MD.,have documented all relevant documentation on the behalf of Crystal Greenland, MD,as directed by  Crystal Greenland, MD while in the presence of Crystal Greenland, MD.  This visit occurred during the SARS-CoV-2 public health emergency.  Safety protocols were in place, including screening questions prior to the visit, additional usage of staff PPE, and extensive cleaning of exam room while observing appropriate contact time as indicated for disinfecting solutions.  Subjective:     Patient ID: Crystal Escobar , female    DOB: Jul 14, 1970 , 50 y.o.   MRN: [id number]   Chief Complaint  Patient presents with   Diabetes   Hypertension    HPI  Patient is here today for DM/HTN f/u. She reports compliance with meds. She denies headaches, chest pain and shortness of breath.   Diabetes She presents for her follow-up diabetic visit. She has type 2 diabetes mellitus. There are no hypoglycemic associated symptoms. There are no diabetic associated symptoms. Pertinent negatives for diabetes include no blurred vision. There are no hypoglycemic complications. Risk factors for coronary artery disease include diabetes mellitus, dyslipidemia, hypertension, obesity and sedentary lifestyle. When asked about current treatments, none were reported. She is compliant with treatment some of the time. She is following a generally healthy diet. She participates in exercise intermittently. An ACE inhibitor/angiotensin II receptor blocker is being taken.  Hypertension This is a chronic problem. The current episode started more than 1 year ago. The problem has been gradually improving since onset. The problem is uncontrolled. Pertinent negatives include no blurred vision. Risk factors for coronary artery disease include obesity, sedentary lifestyle and diabetes mellitus. Past treatments include angiotensin blockers. The current treatment provides moderate improvement.     Past Medical History:  Diagnosis Date   Abnormal Pap smear 2011   Diabetes mellitus    DUB (dysfunctional uterine bleeding) 12/22/2012   HLD (hyperlipidemia)    Hypertension    Sleep apnea    does not use machine as prescribed     Family History  Problem Relation Age of Onset   Seizures Father    Heart attack Mother    Hypertension Mother    Diabetes Mother    Hypertension Sister    Heart murmur Son    Lung cancer Maternal Grandmother    Sickle cell anemia Maternal Aunt    Hypertension Maternal Aunt    Colon cancer Neg Hx      Current Outpatient Medications:    losartan (COZAAR) 50 MG tablet, Take 1 tablet by mouth once daily (Patient taking differently: Take 50 mg by mouth daily.), Disp: 90 tablet, Rfl: 0   Multiple Vitamin (MULTIVITAMIN) capsule, Take 1 capsule by mouth daily., Disp: , Rfl:    Probiotic Product (PROBIOTIC PO), Take 1 capsule by mouth daily., Disp: , Rfl:    Semaglutide (RYBELSUS) 7 MG TABS, Take 7 mg by mouth daily. (Patient taking differently: Take 6 mg by mouth daily.), Disp: 30 tablet, Rfl: 3   atorvastatin (LIPITOR) 20 MG tablet, Take 1 tablet by mouth once daily (Patient not taking: Reported on 11/12/2020), Disp: 30 tablet, Rfl: 0   cetirizine (ZYRTEC) 10 MG tablet, Take 1 tablet (10 mg total) by mouth daily as needed for allergies., Disp: 30 tablet, Rfl: 4   Cholecalciferol (VITAMIN D3) 125 MCG (5000 UT) CAPS, Take 5,000 Units by mouth daily. (Patient not taking: Reported on 11/12/2020), Disp: , Rfl:    No Known Allergies    .  The patient's tobacco use is:  Social History   Tobacco Use  Smoking Status Former   Packs/day: 1.00   Years: 10.00   Pack years: 10.00   Types: Cigars, Cigarettes   Quit date: 06/03/2018   Years since quitting: 2.4  Smokeless Tobacco Never  Tobacco Comments   1 cigar a day  . She has been exposed to passive smoke. The patient's alcohol use is:  Social History   Substance and Sexual Activity  Alcohol Use Yes    Comment: occasionally     Review of Systems  Constitutional: Negative.   Eyes:  Negative for blurred vision.  Respiratory: Negative.    Cardiovascular: Negative.   Gastrointestinal: Negative.   Neurological:  Positive for numbness.       C/o tingling/pain in b/l UE. She is s/p R carpal tunnel release years ago. 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 Denies b/l UE weakness.   Psychiatric/Behavioral: Negative.      Today's Vitals   11/12/20 1636  BP: 120/82  Pulse: 68  Temp: 98.6 F (37 C)  TempSrc: Oral  Weight: (!) 303 lb (137.4 kg)  Height: _0  (1.575 m)   Body mass index is 55.42 kg/m.  Wt Readings from Last 3 Encounters:  11/12/20 (!) 303 lb (137.4 kg)  09/10/20 300 lb (136.1 kg)  08/08/20 300 lb (136.1 kg)    BP Readings from Last 3 Encounters:  11/12/20 120/82  09/10/20 119/72  08/08/20 130/78    Objective:  Physical Exam Vitals and nursing note reviewed.  Constitutional:      Appearance: Normal appearance. She is obese.  HENT:     Head: Normocephalic and atraumatic.     Nose:     Comments: Masked     Mouth/Throat:     Comments: Masked  Cardiovascular:     Rate and Rhythm: Normal rate and regular rhythm.     Heart sounds: Normal heart sounds.  Pulmonary:     Effort: Pulmonary effort is normal.     Breath sounds: Normal breath sounds.  Musculoskeletal:     Cervical back: Normal range of motion.  Skin:    General: Skin is warm.  Neurological:     General: No focal deficit present.     Mental Status: She is alert.  Psychiatric:        Mood and Affect: Mood normal.        Behavior: Behavior normal.        Assessment And Plan:     1. Uncontrolled type 2 diabetes mellitus with hyperglycemia (HCC) Comments: Chronic, I will check labs as listed below. Importance of dietary, medication and exercise compliance was d/w pt.  - BMP8+eGFR - Hemoglobin A1c  2. HYPERTENSION, BENIGN ESSENTIAL Comments: Chronic, well controlled. She is encouraged  to take meds as prescribed, no need for med changes.   3. Paresthesia and pain of both upper extremities Comments: She is s/p CT release on the right. Pos Phalen's, Neg Tinel's. Iwill refer her to Neuro for NCS. Advised to wear b/l wrist splint at night. - Vitamin B12 - TSH - Ambulatory referral to Neurology  4. Class 3 severe obesity due to excess calories with serious comorbidity and body mass index (BMI) of 50.0 to 59.9 in adult [facility]) Comments: BMI 55. She is aware of 3 lb weight gain. She is encouraged to incorporate more exercise into her daily routine.   Patient was given opportunity to ask questions. Patient verbalized understanding of  the plan and was able to repeat key elements of the plan. All questions were answered to their satisfaction.   I, Crystal Greenland, MD, have reviewed all documentation for this visit. The documentation on 11/14/20 for the exam, diagnosis, procedures, and orders are all accurate and complete.  THE PATIENT IS ENCOURAGED TO PRACTICE SOCIAL DISTANCING DUE TO THE COVID-19 PANDEMIC.

## 2020-11-13 LAB — TSH: TSH: 1.25 u[IU]/mL (ref 0.450–4.500)

## 2020-11-13 LAB — BMP8+EGFR
BUN/Creatinine Ratio: 25 — ABNORMAL HIGH (ref 9–23)
BUN: 15 mg/dL (ref 6–24)
CO2: 22 mmol/L (ref 20–29)
Calcium: 9.7 mg/dL (ref 8.7–10.2)
Chloride: 102 mmol/L (ref 96–106)
Creatinine, Ser: 0.6 mg/dL (ref 0.57–1.00)
Glucose: 89 mg/dL (ref 65–99)
Potassium: 4.5 mmol/L (ref 3.5–5.2)
Sodium: 137 mmol/L (ref 134–144)
eGFR: 111 mL/min/{1.73_m2} (ref >59)

## 2020-11-13 LAB — VITAMIN B12: Vitamin B-12: 748 pg/mL (ref 232–1245)

## 2020-11-13 LAB — HEMOGLOBIN A1C
Est. average glucose Bld gHb Est-mCnc: 126 mg/dL
Hgb A1c MFr Bld: 6 % — ABNORMAL HIGH (ref 4.8–5.6)

## 2020-11-26 ENCOUNTER — Other Ambulatory Visit: Payer: Self-pay

## 2020-12-03 ENCOUNTER — Telehealth: Payer: Self-pay

## 2020-12-03 NOTE — Telephone Encounter (Signed)
Appeals process started for [employer]bels[employer].  Waiting on a reply from the pt's insurance company.

## 2021-01-14 ENCOUNTER — Encounter: Payer: Self-pay | Admitting: Internal Medicine

## 2021-01-14 ENCOUNTER — Other Ambulatory Visit: Payer: Self-pay

## 2021-01-14 ENCOUNTER — Ambulatory Visit: Payer: BC Managed Care – PPO | Admitting: Internal Medicine

## 2021-01-14 VITALS — BP 128/80 | HR 70 | Temp 98.4°F | Ht 62.0 in | Wt 307.0 lb

## 2021-01-14 DIAGNOSIS — H9202 Otalgia, left ear: Secondary | ICD-10-CM | POA: Diagnosis not present

## 2021-01-14 DIAGNOSIS — H6692 Otitis media, unspecified, left ear: Secondary | ICD-10-CM

## 2021-01-14 MED ORDER — CEFTRIAXONE SODIUM 500 MG IJ SOLR
500.0000 mg | Freq: Once | INTRAMUSCULAR | Status: AC
  Administered 2021-01-14: 500 mg via INTRAMUSCULAR

## 2021-01-14 MED ORDER — NOREL AD 4-10-325 MG PO TABS: 4.0000 mg | ORAL_TABLET | Freq: Two times a day (BID) | ORAL | 0 refills | Status: DC | PRN

## 2021-01-14 NOTE — Progress Notes (Signed)
[name] as a Education administrator for Crystal Greenland, MD.,have documented all relevant documentation on the behalf of Crystal Greenland, MD,as directed by  Crystal Greenland, MD while in the presence of Crystal Greenland, MD.  This visit occurred during the SARS-CoV-2 public health emergency.  Safety protocols were in place, including screening questions prior to the visit, additional usage of staff PPE, and extensive cleaning of exam room while observing appropriate contact time as indicated for disinfecting solutions.  Subjective:     Patient ID: Crystal Escobar , female    DOB: December 18, 1970 , 49 y.o.   MRN: [id number]   Chief Complaint  Patient presents with   Otalgia     HPI  Pt presents today for evaluation for an earache. She went to urgent care on Sunday, they said she had an ear infection. Pt now says her left ear is starting to bother her, they gave her Amoxicillin. Despite this, she has not had any improvement in her sx. She admits she has not completed full course of abx.     Past Medical History:  Diagnosis Date   Abnormal Pap smear 2011   Diabetes mellitus    DUB (dysfunctional uterine bleeding) 12/22/2012   HLD (hyperlipidemia)    Hypertension    Sleep apnea    does not use machine as prescribed     Family History  Problem Relation Age of Onset   Seizures Father    Heart attack Mother    Hypertension Mother    Diabetes Mother    Hypertension Sister    Heart murmur Son    Lung cancer Maternal Grandmother    Sickle cell anemia Maternal Aunt    Hypertension Maternal Aunt    Colon cancer Neg Hx      Current Outpatient Medications:    Chlorphen-PE-Acetaminophen (NOREL AD) 4-10-325 MG TABS, Take 4-10 mg by mouth 2 (two) times daily as needed., Disp: 20 tablet, Rfl: 0   Multiple Vitamin (MULTIVITAMIN) capsule, Take 1 capsule by mouth daily., Disp: , Rfl:    Probiotic Product (PROBIOTIC PO), Take 1 capsule by mouth daily., Disp: , Rfl:    amoxicillin (AMOXIL) 875 MG  tablet, Take 875 mg by mouth 2 (two) times daily., Disp: , Rfl:    atorvastatin (LIPITOR) 20 MG tablet, Take 1 tablet by mouth once daily (Patient not taking: No sig reported), Disp: 30 tablet, Rfl: 0   cetirizine (ZYRTEC) 10 MG tablet, Take 1 tablet (10 mg total) by mouth daily as needed for allergies. (Patient not taking: Reported on 01/14/2021), Disp: 30 tablet, Rfl: 4   Cholecalciferol (VITAMIN D3) 125 MCG (5000 UT) CAPS, Take 5,000 Units by mouth daily. (Patient not taking: No sig reported), Disp: , Rfl:    losartan (COZAAR) 50 MG tablet, Take 1 tablet by mouth once daily (Patient not taking: Reported on 01/14/2021), Disp: 90 tablet, Rfl: 0   Semaglutide (RYBELSUS) 7 MG TABS, Take 7 mg by mouth daily. (Patient not taking: Reported on 01/14/2021), Disp: 30 tablet, Rfl: 3   No Known Allergies   Review of Systems  Constitutional: Negative.   HENT:  Positive for ear pain. Negative for hearing loss, nosebleeds and rhinorrhea.   Respiratory: Negative.    Cardiovascular: Negative.   Neurological: Negative.   Psychiatric/Behavioral: Negative.      Today's Vitals   01/14/21 1535  BP: 128/80  Pulse: 70  Temp: 98.4 F (36.9 C)  Weight: (!) 307 lb (139.3 kg)  Height: 5\' 2"  (  1.575 m)   Body mass index is 56.15 kg/m.  Wt Readings from Last 3 Encounters:  01/14/21 (!) 307 lb (139.3 kg)  11/12/20 (!) 303 lb (137.4 kg)  09/10/20 300 lb (136.1 kg)    Objective:  Physical Exam Vitals and nursing note reviewed.  Constitutional:      Appearance: Normal appearance.  HENT:     Head: Normocephalic and atraumatic.     Right Ear: Tympanic membrane, ear canal and external ear normal. There is no impacted cerumen.     Left Ear: Hearing, ear canal and external ear normal. A middle ear effusion is present. There is no impacted cerumen.  Cardiovascular:     Rate and Rhythm: Normal rate and regular rhythm.     Heart sounds: Normal heart sounds.  Pulmonary:     Effort: Pulmonary effort is normal.      Breath sounds: Normal breath sounds.  Musculoskeletal:     Cervical back: Normal range of motion.  Skin:    General: Skin is warm.  Neurological:     General: No focal deficit present.     Mental Status: She is alert.  Psychiatric:        Mood and Affect: Mood normal.        Behavior: Behavior normal.        Assessment And Plan:     1. Left otitis media, unspecified otitis media type Comments: She was given Rocephin, 500mg  IM  x1. Encouraged to complete full course of Abx.  - cefTRIAXone (ROCEPHIN) injection 500 mg  2. Acute otalgia, left Comments: She was given samples of Norel AD to take twice daily prn.    Patient was given opportunity to ask questions. Patient verbalized understanding of the plan and was able to repeat key elements of the plan. All questions were answered to their satisfaction.   I, Crystal Greenland, MD, have reviewed all documentation for this visit. The documentation on 01/14/21 for the exam, diagnosis, procedures, and orders are all accurate and complete.   IF YOU HAVE BEEN REFERRED TO A SPECIALIST, IT MAY TAKE 1-2 WEEKS TO SCHEDULE/PROCESS THE REFERRAL. IF YOU HAVE NOT HEARD FROM US/SPECIALIST IN TWO WEEKS, PLEASE GIVE [place] A CALL AT [phone].   THE PATIENT IS ENCOURAGED TO PRACTICE SOCIAL DISTANCING DUE TO THE COVID-19 PANDEMIC.

## 2021-01-14 NOTE — Patient Instructions (Signed)
Earache, Adult An earache, or ear pain, can be caused by many things, including: An infection. Ear wax buildup. Ear pressure. Something in the ear that should not be there (foreign body). A sore throat. Tooth problems. Jaw problems. Treatment of the earache will depend on the cause. If the cause is not clear or cannot be determined, you may need to watch your symptoms until your earache goes away or until a cause is found. Follow these instructions at home: Medicines Take or apply over-the-counter and prescription medicines only as told by your health care provider. If you were prescribed an antibiotic medicine, use it as told by your health care provider. Do not stop using the antibiotic even if you start to feel better. Do not put anything in your ear other than medicine that is prescribed by your health care provider. Managing pain If directed, apply heat to the affected area as often as told by your health care provider. Use the heat source that your health care provider recommends, such as a moist heat pack or a heating pad. Place a towel between your skin and the heat source. Leave the heat on for 20-30 minutes. Remove the heat if your skin turns bright red. This is especially important if you are unable to feel pain, heat, or cold. You may have a greater risk of getting burned. If directed, put ice on the affected area as often as told by your health care provider. To do this:   Put ice in a plastic bag. Place a towel between your skin and the bag. Leave the ice on for 20 minutes, 2-3 times a day. General instructions Pay attention to any changes in your symptoms. Try resting in an upright position instead of lying down. This may help to reduce pressure in your ear and relieve pain. Chew gum if it helps to relieve your ear pain. Treat any allergies as told by your health care provider. Drink enough fluid to keep your urine pale yellow. It is up to you to get the results of any  tests that were done. Ask your health care provider, or the department that is doing the tests, when your results will be ready. Keep all follow-up visits as told by your health care provider. This is important. Contact a health care provider if: Your pain does not improve within 2 days. Your earache gets worse. You have new symptoms. You have a fever. Get help right away if you: Have a severe headache. Have a stiff neck. Have trouble swallowing. Have redness or swelling behind your ear. Have fluid or blood coming from your ear. Have hearing loss. Feel dizzy. Summary An earache, or ear pain, can be caused by many things. Treatment of the earache will depend on the cause. Follow recommendations from your health care provider to treat your ear pain. If the cause is not clear or cannot be determined, you may need to watch your symptoms until your earache goes away or until a cause is found. Keep all follow-up visits as told by your health care provider. This is important. This information is not intended to replace advice given to you by your health care provider. Make sure you discuss any questions you have with your health care provider. Document Revised: 11/19/2018 Document Reviewed: 11/19/2018 Elsevier Patient Education  2022 Elsevier Inc.  

## 2021-01-20 ENCOUNTER — Encounter: Payer: Self-pay | Admitting: Internal Medicine

## 2021-02-20 ENCOUNTER — Ambulatory Visit: Payer: BC Managed Care – PPO | Admitting: Neurology

## 2021-02-20 ENCOUNTER — Encounter: Payer: Self-pay | Admitting: Neurology

## 2021-02-20 VITALS — BP 118/84 | HR 61 | Ht 62.0 in | Wt 313.0 lb

## 2021-02-20 DIAGNOSIS — G4733 Obstructive sleep apnea (adult) (pediatric): Secondary | ICD-10-CM | POA: Diagnosis not present

## 2021-02-20 DIAGNOSIS — E8881 Metabolic syndrome: Secondary | ICD-10-CM | POA: Diagnosis not present

## 2021-02-20 DIAGNOSIS — Z9884 Bariatric surgery status: Secondary | ICD-10-CM | POA: Diagnosis not present

## 2021-02-20 DIAGNOSIS — M79641 Pain in right hand: Secondary | ICD-10-CM | POA: Diagnosis not present

## 2021-02-20 NOTE — Progress Notes (Signed)
SLEEP MEDICINE CLINIC   Provider:  Larey Seat, MD   Primary Care Physician:  Glendale Chard, MD   Referring Provider: Glendale Chard, MD    Chief Complaint  Patient presents with   New Patient (Initial Visit)    Rm 10, alone. Internal referral for NCV/EMG paresthesia and pn of upper extremities. Pt reports her fingers in her R hand locks up and has nerve pn. Pn can go up to her neck.     HPI:  Crystal Escobar is a 50 y.o. female patient who was seen on 04-11-2018 , originally in a referral from Dr. Baird Cancer for a sleep evaluation.   Crystal Escobar followed that visit with a sleep test the procedure was done on 06-22-2018 she was diagnosed with severe obstructive sleep apnea.  136 respiratory events:  0 obstructive apneas, 1 central apnea and 0 mixed apneas with a total of 1 apneas and an apnea index (AI) of 0.2 /hour. There were 135 hypopneas with a hypopnea index of 22.6 /hour.    The total APNEA/HYPOPNEA INDEX (AHI) was 22.8 /hour and the total RESPIRATORY DISTURBANCE INDEX was 22.8 /hour.  51 events occurred in REM sleep and 169 events in NREM. The REM AHI was 70.3 /hour, versus a non-REM AHI of 16.2. The patient spent 358.5 minutes of total sleep time in the supine position and 0 minutes in non-supine. The supine AHI was /h 22.8 versus a non-supine AHI of 0.0/h.   OXYGEN SATURATION & C02:  The [place] baseline 02 saturation was 92%, with the lowest being 80%. Time spent below 89% saturation equaled 90 minutes. However soon after we closed with the onset of the corona pandemic and she was lost in follow-up. The patient was prescribed an auto titration CPAP and she had worn it initially but meanwhile she has discontinued its use.  She also stated that 2 masks or interfaces were tried with her but they seem both to irritate her face.  When she started a new job she could not find the time to go back and try for third mask.  We meet today because her primary care physician is concerned  about reported abnormal sensations in the upper extremities the patient has reported that her right dominant hand feels sometimes stiff and locked up.  It is mostly the middle finger that she describes as locking up. She also has pain arising at the hand and can sometimes radiate up to the shoulder and neck spine.          Chief complaint according to patient : [name] reports that she has not been as concerned about her risk of sleep apnea, but that her primary care physician, Dr. Gertie Exon, MD, had arranged for this visit today.  She has reached a body mass index of 63.74 kg/m and does have an increase in headache frequency, the headache is located in the bilateral and frontal region and does not radiate it may be hypertension related.   She has essential hypertension of a benign kind and is taking medications for that.  She has also been diagnosed with hypercholesterolemia and diabetes.   She had carpal tunnel surgery in 2016 and 2 C-sections one in 2003 and one in 2011.  Sleep habits are as follows: dinner time, around 5-6 PM,  No exercise, feels too tired, bedtime at 10 PM- no tv, no lights. Falls asleep after 5 minutes to 30 minutes.  She is sleeping with 2 pillows in supine position. The  patient reports going to the bathroom once sometimes 2 times at night, she has no difficulties going back to sleep however. Her spouse has noted that she is snoring and he has noted apneas as well.  She is not sure how often these occur.  When she wakes up in the morning she relies on an alarm set to 5:30 AM.  She feels not refreshed or restored in the morning, wakes with a very dry mouth and  with headaches. Sleep medical history and family sleep history: no family history.  No tonsillectomy , no TBI, No neck injury or surgery. Social history: married, with 2 children. Working regular daytime hours- at [employer]. Smoker - 1-2 cigars a day, ETOH - not more 2 drinks / 3 month. caffeine- coffee in  AM. Iced Tea when she goes out.  HS graduate - some college.     Review of Systems: Out of a complete 14 system review, the patient complains of only the following symptoms, and all other reviewed systems are negative. She has a normal heart rate and regular rhythm and she has report that she snores. Non restorative sleep.   Epworth score 13/ 24 , Fatigue severity score 49/ 63   , depression score ; denied.    Social History   Socioeconomic History   Marital status: Married    Spouse name: Crystal Escobar   Number of children: 2   Years of education: Not on file   Highest education level: High school graduate  Occupational History   Not on file  Tobacco Use   Smoking status: Former    Packs/day: 1.00    Years: 10.00    Pack years: 10.00    Types: Cigars, Cigarettes    Quit date: 06/03/2018    Years since quitting: 2.7   Smokeless tobacco: Never   Tobacco comments:    1 cigar a day  Vaping Use   Vaping Use: Never used  Substance and Sexual Activity   Alcohol use: Yes    Comment: occasionally    Drug use: No   Sexual activity: Yes    Partners: Male    Birth control/protection: Surgical    Comment: BTL  Other Topics Concern   Not on file  Social History Narrative   Lives with husband and 2 children   Right handed   Caffeine: 1 cup of coffee a day   Social Determinants of Radio broadcast assistant Strain: Not on file  Food Insecurity: Not on file  Transportation Needs: Not on file  Physical Activity: Not on file  Stress: Not on file  Social Connections: Not on file  Intimate Partner Violence: Not on file    Family History  Problem Relation Age of Onset   Seizures Father    Heart attack Mother    Hypertension Mother    Diabetes Mother    Hypertension Sister    Heart murmur Son    Lung cancer Maternal Grandmother    Sickle cell anemia Maternal Aunt    Hypertension Maternal Aunt    Colon cancer Neg Hx     Past Medical History:  Diagnosis Date   Abnormal Pap  smear 2011   Diabetes mellitus    DUB (dysfunctional uterine bleeding) 12/22/2012   HLD (hyperlipidemia)    Hypertension    Sleep apnea    does not use machine as prescribed    Past Surgical History:  Procedure Laterality Date   CARPAL TUNNEL RELEASE Right 2016   CESAREAN SECTION  COLONOSCOPY WITH PROPOFOL N/A 09/10/2020   Procedure: COLONOSCOPY WITH PROPOFOL;  Surgeon: Eloise Harman, DO;  Location: [facility];  Service: Endoscopy;  Laterality: N/A;  AM (diabetic)   GASTRIC BYPASS  05/2019   [facility]: Dr. Shawna Orleans   KNEE SURGERY Left    cartilage   POLYPECTOMY  09/10/2020   Procedure: POLYPECTOMY;  Surgeon: Eloise Harman, DO;  Location: [facility];  Service: Endoscopy;;   TUBAL LIGATION      Current Outpatient Medications  Medication Sig Dispense Refill   atorvastatin (LIPITOR) 20 MG tablet Take 1 tablet by mouth once daily 30 tablet 0   cetirizine (ZYRTEC) 10 MG tablet Take 1 tablet (10 mg total) by mouth daily as needed for allergies. 30 tablet 4   Cholecalciferol (VITAMIN D3) 125 MCG (5000 UT) CAPS Take 5,000 Units by mouth daily.     losartan (COZAAR) 50 MG tablet Take 1 tablet by mouth once daily 90 tablet 0   Multiple Vitamin (MULTIVITAMIN) capsule Take 1 capsule by mouth daily.     Probiotic Product (PROBIOTIC PO) Take 1 capsule by mouth daily.     Semaglutide (RYBELSUS) 7 MG TABS Take 7 mg by mouth daily. 30 tablet 3   No current facility-administered medications for this visit.    Allergies as of 02/20/2021   (No Known Allergies)    Vitals: BP 118/84   Pulse 61   Ht 5\' 2"  (1.575 m)   Wt (!) 313 lb (142 kg)   BMI 57.25 kg/m  Last Weight:  Wt Readings from Last 1 Encounters:  02/20/21 (!) 313 lb (142 kg)   WRU:EAVW mass index is 57.25 kg/m.     Last Height:   Ht Readings from Last 1 Encounters:  02/20/21 5\' 2"  (1.575 m)    Physical exam:  General: The patient is awake, alert and appears not in acute distress. The patient is well groomed. Head:  Normocephalic, atraumatic. Neck is supple. Mallampati 3,  neck circumference:16" .  Nasal airflow patent , Retrognathia is mildly seen.  Cardiovascular:  Regular rate and rhythm, without  murmurs or carotid bruit, and without distended neck veins. Respiratory: Lungs are clear to auscultation. Skin:  With evidence of edema, ankle swelling.  Trunk: BMI is over 55, .  Neurologic exam : The patient is awake and alert, oriented to place and time.   Memory subjective described as intact.  Attention span & concentration ability appears normal.  Speech is fluent,  without dysarthria, but mild dysphonia.  Mood and affect are appropriate.   Cranial nerves: Pupils are equal and briskly reactive to light. Funduscopic exam without evidence of pallor or edema. Extraocular movements in vertical and horizontal planes intact and without nystagmus. Visual fields by finger perimetry are intact. Hearing to finger rub intact.   Facial sensation intact to fine touch.  Facial motor strength is symmetric and tongue and uvula move midline. Shoulder shrug was symmetrical.   Motor exam: The patient provides a slightly weaker grip strength than expected with her right versus left hand.  She does have changes over her metaphalangeal joints and interdigital joints.  She was able here to flex and extend her middle finger but she says sometimes it is just locked.  Passive rotation of the shoulder with pressure applied to the rotator cuff does increase the sensation of pain.  She also had normal wrist motility and normal elbow mobility.  Her neck is not stiff.   She is able to shrug both shoulders,  can reach the back pocket with both hands.  Thenar eminence is shrunken , slightly. .    She does suffer from osteoarthritis in both knees but the lower extremities are not in any way affected by shooting pains like her hands produce.  It is only her right hand.  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   Sensory:  Fine touch, pinprick and vibration were tested in all extremities. Proprioception tested in the upper extremities was normal. Coordination: Rapid alternating movements in the fingers/hands was normal. Finger-to-nose maneuver  normal without evidence of ataxia, dysmetria or tremor. Gait and station: Patient walks without assistive device , but arthralgic.  Strength within normal limits. Wide based.    Assessment:  After physical and neurologic examination, review of laboratory studies,  Personal review of imaging studies, reports of other /same  Imaging studies, results of polysomnography and / or neurophysiology testing and pre-existing records as far as provided in visit., my assessment is :   1)I spent more than 25  minutes of face to face time with the patient.this visit is is for a new problem of 'shooting and aching pain"  , ascending from her hand to the shoulder, focal tenderness over he roatorcuff, and deep pain in the center of the right palm, this pain will stay for minutes.     Greater than 50% of time was spent in counseling and coordination of care. We have discussed the diagnosis and differential and I answered the patient's questions:  1) NCV and EMG , Dr Jaynee Eagles, please.  I tried to reiew the MRI cervcial spine [id number] but this was done through [facility] and I have no access to the images. [facility]   2) rheumatic panel.  3) I like for her to bring her CPAP to the next visit, try to use it for 14 days so we can gather and review some data.  Risk factors for OSA remain : morbidly obese, not physically active.  Mrs. Stavros reports that she had weight loss surgery with [facility]  - she lost 50 (?) pounds.  Was at 349 pounds, She is at a stand still now at 313 pounds.    Treatment of apnea is likely still needed: considering  her comorbidities ; which include diabetes mellitus, hypertension, possible obesity hypoventilation    Larey Seat, MD  93/23/5573, 2:20 PM  Certified in Neurology by ABPN Certified in [place] by [facility] [address] [place], [place] [address]

## 2021-02-25 ENCOUNTER — Telehealth: Payer: Self-pay | Admitting: *Deleted

## 2021-02-25 LAB — ANA W/REFLEX: ANA Titer 1: NEGATIVE

## 2021-02-25 LAB — MULTIPLE MYELOMA PANEL, SERUM
Albumin SerPl Elph-Mcnc: 3.6 g/dL (ref 2.9–4.4)
Albumin/Glob SerPl: 1 (ref 0.7–1.7)
Alpha 1: 0.2 g/dL (ref 0.0–0.4)
Alpha2 Glob SerPl Elph-Mcnc: 1 g/dL (ref 0.4–1.0)
B-Globulin SerPl Elph-Mcnc: 1.1 g/dL (ref 0.7–1.3)
Gamma Glob SerPl Elph-Mcnc: 1.7 g/dL (ref 0.4–1.8)
Globulin, Total: 3.9 g/dL (ref 2.2–3.9)
IgA/Immunoglobulin A, Serum: <5 mg/dL — ABNORMAL LOW (ref 87–352)
IgG (Immunoglobin G), Serum: 1813 mg/dL — ABNORMAL HIGH (ref 586–1602)
IgM (Immunoglobulin M), Srm: 165 mg/dL (ref 26–217)
Total Protein: 7.5 g/dL (ref 6.0–8.5)

## 2021-02-25 LAB — METHYLMALONIC ACID, SERUM: Methylmalonic Acid: 127 nmol/L (ref 0–378)

## 2021-02-25 LAB — HOMOCYSTEINE: Homocysteine: 8.4 umol/L (ref 0.0–14.5)

## 2021-02-25 LAB — SJOGREN'S SYNDROME ANTIBODS(SSA + SSB)
ENA SSA (RO) Ab: 0.3 AI (ref 0.0–0.9)
ENA SSB (LA) Ab: <0.2 AI (ref 0.0–0.9)

## 2021-02-25 LAB — SEDIMENTATION RATE: Sed Rate: 68 mm/hr — ABNORMAL HIGH (ref 0–32)

## 2021-02-25 LAB — C-REACTIVE PROTEIN: CRP: 6 mg/L (ref 0–10)

## 2021-02-25 NOTE — Progress Notes (Signed)
Abnormal polyclonal protein e-phoresis. High IGG, very low IGA , normal IGM.  Autoimmune screen was negative.  Please have PCP manage this-I forwarded to Dr Baird Cancer.

## 2021-02-25 NOTE — Telephone Encounter (Addendum)
Received further results from Dr. Brett Fairy: "Abnormal polyclonal protein e-phoresis. High IGG, very low IGA , normal IGM.  Autoimmune screen was negative. Please have PCP manage this-I forwarded to Dr Baird Cancer."  I called pt. Relayed results per Dr. Edwena Felty note. Pt verbalized understanding. She will f/u w/ PCP and see if she needs to see Rheumatology for further work up.

## 2021-02-25 NOTE — Progress Notes (Signed)
Normal methylmalonic acid level is a sign of normal vitamin B12. Homocysteine in normal range, too. Normal C reactive protein- an inflammatory marker, but    High sed rate - this unspecific inflammatory marker is elevated, can be related to obesity.  Negative ANA titer-this important autoimmune disease marker is negative ( would be present in Lupus, this is a good result !). ENA antibody negative- Sjogren Ab-same as above.  Awaiting NCV and EMG .

## 2021-02-25 NOTE — Telephone Encounter (Signed)
Called and spoke with pt about results per Dr. Edwena Felty note. Pt verbalized understanding and has no further questions. She is scheduled for EMG/NCS for 05/08/21

## 2021-02-25 NOTE — Telephone Encounter (Signed)
-----   Message from Larey Seat, MD sent at 02/25/2021 10:26 AM EDT ----- Normal methylmalonic acid level is a sign of normal vitamin B12. Homocysteine in normal range, too. Normal C reactive protein- an inflammatory marker, but    High sed rate - this unspecific inflammatory marker is elevated, can be related to obesity.  Negative ANA titer-this important autoimmune disease marker is negative ( would be present in Lupus, this is a good result !). ENA antibody negative- Sjogren Ab-same as above.  Awaiting NCV and EMG .

## 2021-02-28 ENCOUNTER — Encounter: Payer: Self-pay | Admitting: Internal Medicine

## 2021-03-18 ENCOUNTER — Other Ambulatory Visit: Payer: Self-pay | Admitting: Internal Medicine

## 2021-03-24 ENCOUNTER — Ambulatory Visit: Payer: BC Managed Care – PPO | Admitting: Internal Medicine

## 2021-04-03 ENCOUNTER — Telehealth: Payer: Self-pay

## 2021-04-03 NOTE — Progress Notes (Signed)
    Chronic Care Management Pharmacy Assistant   Name: Crystal Escobar  MRN: [id number] DOB: 1970-06-12  Reason for Encounter: Patient Assistance Application   Medications: Outpatient Encounter Medications as of 04/03/2021  Medication Sig   atorvastatin (LIPITOR) 20 MG tablet Take 1 tablet by mouth once daily   cetirizine (ZYRTEC) 10 MG tablet Take 1 tablet (10 mg total) by mouth daily as needed for allergies.   Cholecalciferol (VITAMIN D3) 125 MCG (5000 UT) CAPS Take 5,000 Units by mouth daily.   losartan (COZAAR) 50 MG tablet Take 1 tablet by mouth once daily   Multiple Vitamin (MULTIVITAMIN) capsule Take 1 capsule by mouth daily.   Probiotic Product (PROBIOTIC PO) Take 1 capsule by mouth daily.   Semaglutide (RYBELSUS) 7 MG TABS Take 7 mg by mouth daily.   No facility-administered encounter medications on file as of 04/03/2021.    Patient assistance application for Rybelsus mailed to patient. Patient informed to fill out the highlighted areas on the application and return application to the clinic with income verification.   [place] Pharmacist Assistant [phone]

## 2021-04-16 ENCOUNTER — Ambulatory Visit: Payer: BC Managed Care – PPO | Admitting: Internal Medicine

## 2021-04-16 ENCOUNTER — Other Ambulatory Visit: Payer: Self-pay

## 2021-04-16 ENCOUNTER — Encounter: Payer: Self-pay | Admitting: Internal Medicine

## 2021-04-16 VITALS — BP 110/70 | HR 64 | Temp 97.7°F | Ht 62.0 in | Wt 310.6 lb

## 2021-04-16 DIAGNOSIS — Z6841 Body Mass Index (BMI) 40.0 and over, adult: Secondary | ICD-10-CM

## 2021-04-16 DIAGNOSIS — D890 Polyclonal hypergammaglobulinemia: Secondary | ICD-10-CM

## 2021-04-16 DIAGNOSIS — I10 Essential (primary) hypertension: Secondary | ICD-10-CM | POA: Diagnosis not present

## 2021-04-16 DIAGNOSIS — Z23 Encounter for immunization: Secondary | ICD-10-CM

## 2021-04-16 DIAGNOSIS — M79641 Pain in right hand: Secondary | ICD-10-CM | POA: Diagnosis not present

## 2021-04-16 DIAGNOSIS — E1165 Type 2 diabetes mellitus with hyperglycemia: Secondary | ICD-10-CM

## 2021-04-16 NOTE — Progress Notes (Signed)
[name],acting as a Education administrator for [name], MD.,have documented all relevant documentation on the behalf of [name], MD,as directed by  [name], MD while in the presence of [name], MD.  This visit occurred during the SARS-CoV-2 public health emergency.  Safety protocols were in place, including screening questions prior to the visit, additional usage of staff PPE, and extensive cleaning of exam room while observing appropriate contact time as indicated for disinfecting solutions.  Subjective:     Patient ID: Crystal Escobar , female    DOB: 06/22/1970 , 50 y.o.   MRN: [id number]   Chief Complaint  Patient presents with   Diabetes   Hypertension    HPI  Patient is here today for DM/HTN f/u. She reports compliance with meds. She denies headaches, chest pain and shortness of breath.  States she has been started on [facility] by her [employer]. She is now on $Remo'5mg'uOsrU$  dose. She has not had any issues with the medication. She was not advised to stop Rybelsus.   Diabetes She presents for her follow-up diabetic visit. She has type 2 diabetes mellitus. There are no hypoglycemic associated symptoms. There are no diabetic associated symptoms. Pertinent negatives for diabetes include no blurred vision. There are no hypoglycemic complications. Risk factors for coronary artery disease include diabetes mellitus, dyslipidemia, hypertension, obesity and sedentary lifestyle. When asked about current treatments, none were reported. She is compliant with treatment some of the time. She is following a generally healthy diet. She participates in exercise intermittently. An ACE inhibitor/angiotensin II receptor blocker is being taken.  Hypertension This is a chronic problem. The current episode started more than 1 year ago. The problem has been gradually improving since onset. The problem is uncontrolled. Pertinent negatives include no blurred vision. Risk factors for  coronary artery disease include obesity, sedentary lifestyle and diabetes mellitus. Past treatments include angiotensin blockers. The current treatment provides moderate improvement.    Past Medical History:  Diagnosis Date   Abnormal Pap smear 2011   Diabetes mellitus    DUB (dysfunctional uterine bleeding) 12/22/2012   HLD (hyperlipidemia)    Hypertension    Sleep apnea    does not use machine as prescribed     Family History  Problem Relation Age of Onset   Seizures Father    Heart attack Mother    Hypertension Mother    Diabetes Mother    Hypertension Sister    Heart murmur Son    Lung cancer Maternal Grandmother    Sickle cell anemia Maternal Aunt    Hypertension Maternal Aunt    Colon cancer Neg Hx      Current Outpatient Medications:    cetirizine (ZYRTEC) 10 MG tablet, Take 1 tablet (10 mg total) by mouth daily as needed for allergies., Disp: 30 tablet, Rfl: 4   Cholecalciferol (VITAMIN D3) 125 MCG (5000 UT) CAPS, Take 5,000 Units by mouth daily., Disp: , Rfl:    losartan (COZAAR) 50 MG tablet, Take 1 tablet by mouth once daily, Disp: 90 tablet, Rfl: 0   Multiple Vitamin (MULTIVITAMIN) capsule, Take 1 capsule by mouth daily., Disp: , Rfl:    Probiotic Product (PROBIOTIC PO), Take 1 capsule by mouth daily., Disp: , Rfl:    tirzepatide (MOUNJARO) 5 MG/0.5ML Pen, Inject 5 mg into the skin once a week., Disp: , Rfl:    atorvastatin (LIPITOR) 20 MG tablet, Take 1 tablet by mouth once daily (Patient not taking: Reported on  04/16/2021), Disp: 30 tablet, Rfl: 0   No Known Allergies   Review of Systems  Constitutional: Negative.   Eyes:  Negative for blurred vision.  Respiratory: Negative.    Cardiovascular: Negative.   Musculoskeletal:  Positive for arthralgias.  Neurological: Negative.   Psychiatric/Behavioral: Negative.      Today's Vitals   04/16/21 1623  BP: 110/70  Pulse: 64  Temp: 97.7 F (36.5 C)  Weight: (!) 310 lb 9.6 oz (140.9 kg)  Height: $Remove'5\' 2"'APRRGgI$  (1.575  m)  PainSc: 0-No pain   Body mass index is 56.81 kg/m.  Wt Readings from Last 3 Encounters:  04/16/21 (!) 310 lb 9.6 oz (140.9 kg)  02/20/21 (!) 313 lb (142 kg)  01/14/21 (!) 307 lb (139.3 kg)     Objective:  Physical Exam Vitals and nursing note reviewed.  Constitutional:      Appearance: Normal appearance. She is obese.  HENT:     Head: Normocephalic and atraumatic.     Nose:     Comments: Masked     Mouth/Throat:     Comments: Masked  Eyes:     Extraocular Movements: Extraocular movements intact.  Cardiovascular:     Rate and Rhythm: Normal rate and regular rhythm.     Heart sounds: Normal heart sounds.  Pulmonary:     Effort: Pulmonary effort is normal.     Breath sounds: Normal breath sounds.  Musculoskeletal:     Cervical back: Normal range of motion.     Comments: Neg squeeze test b/l  Skin:    General: Skin is warm.  Neurological:     General: No focal deficit present.     Mental Status: She is alert.  Psychiatric:        Mood and Affect: Mood normal.        Behavior: Behavior normal.        Assessment And Plan:     1. Uncontrolled type 2 diabetes mellitus with hyperglycemia (HCC) Comments: Chronic, now on Mounjaro per [facility]. Unfortunately, they did not advise her to stop Rybelsus. She was advised to stop Rybelsus. F/u 3 months.  - CMP14+EGFR - Hemoglobin A1c  2. HYPERTENSION, BENIGN ESSENTIAL Comments: Chronic, well controlled. Encouraged to follow low sodium diet. No med changes.   3. Right hand pain Comments: Neg autoimmune screen as per Neuro. I will refer her to Rheum for further evaluation. 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 Possibly due to liver disease. Will consider Hematology evaluation if Rheum eval negative.   5. Class 3 severe obesity due to  excess calories with serious comorbidity and body mass index (BMI) of 50.0 to 59.9 in adult [facility]) Comments: BMI 56. She is advised to incorporate more exercise into her daily routine. Advised to aim for at least 150 minutes of exercise per week.   6. Immunization due Comments: She was given flu shot.  - Flu Vaccine QUAD 6+ mos PF IM (Fluarix Quad PF)   Patient was given opportunity to ask questions. Patient verbalized understanding of the plan and was able to repeat key elements of the plan. All questions were answered to their satisfaction.   I, [name], MD, have reviewed all documentation for this visit. The documentation on 04/16/21 for the exam, diagnosis, procedures, and orders are all accurate and complete.   IF YOU HAVE BEEN REFERRED  TO A SPECIALIST, IT MAY TAKE 1-2 WEEKS TO SCHEDULE/PROCESS THE REFERRAL. IF YOU HAVE NOT HEARD FROM US/SPECIALIST IN TWO WEEKS, PLEASE GIVE [place] A CALL AT [phone] X [phone].   THE PATIENT IS ENCOURAGED TO PRACTICE SOCIAL DISTANCING DUE TO THE COVID-19 PANDEMIC.

## 2021-04-16 NOTE — Patient Instructions (Signed)

## 2021-04-17 ENCOUNTER — Ambulatory Visit: Payer: BC Managed Care – PPO | Admitting: Internal Medicine

## 2021-04-17 LAB — CMP14+EGFR
ALT: 17 IU/L (ref 0–32)
AST: 16 IU/L (ref 0–40)
Albumin/Globulin Ratio: 1.2 (ref 1.2–2.2)
Albumin: 4.2 g/dL (ref 3.8–4.8)
Alkaline Phosphatase: 129 IU/L — ABNORMAL HIGH (ref 44–121)
BUN/Creatinine Ratio: 12 (ref 9–23)
BUN: 9 mg/dL (ref 6–24)
Bilirubin Total: 0.2 mg/dL (ref 0.0–1.2)
CO2: 24 mmol/L (ref 20–29)
Calcium: 9.3 mg/dL (ref 8.7–10.2)
Chloride: 107 mmol/L — ABNORMAL HIGH (ref 96–106)
Creatinine, Ser: 0.76 mg/dL (ref 0.57–1.00)
Globulin, Total: 3.6 g/dL (ref 1.5–4.5)
Glucose: 82 mg/dL (ref 70–99)
Potassium: 4.7 mmol/L (ref 3.5–5.2)
Sodium: 141 mmol/L (ref 134–144)
Total Protein: 7.8 g/dL (ref 6.0–8.5)
eGFR: 97 mL/min/{1.73_m2} (ref >59)

## 2021-04-17 LAB — HEMOGLOBIN A1C
Est. average glucose Bld gHb Est-mCnc: 126 mg/dL
Hgb A1c MFr Bld: 6 % — ABNORMAL HIGH (ref 4.8–5.6)

## 2021-04-17 LAB — RHEUMATOID FACTOR: Rheumatoid fact SerPl-aCnc: <10 IU/mL (ref <14.0)

## 2021-04-17 LAB — CYCLIC CITRUL PEPTIDE ANTIBODY, IGG/IGA: Cyclic Citrullin Peptide Ab: 5 units (ref 0–19)

## 2021-04-19 DIAGNOSIS — D890 Polyclonal hypergammaglobulinemia: Secondary | ICD-10-CM | POA: Insufficient documentation

## 2021-04-19 DIAGNOSIS — E7800 Pure hypercholesterolemia, unspecified: Secondary | ICD-10-CM | POA: Insufficient documentation

## 2021-04-23 ENCOUNTER — Encounter: Payer: Self-pay | Admitting: Internal Medicine

## 2021-05-08 ENCOUNTER — Other Ambulatory Visit: Payer: Self-pay

## 2021-05-08 ENCOUNTER — Encounter: Payer: BC Managed Care – PPO | Admitting: Diagnostic Neuroimaging

## 2021-05-08 ENCOUNTER — Ambulatory Visit (INDEPENDENT_AMBULATORY_CARE_PROVIDER_SITE_OTHER): Payer: BC Managed Care – PPO | Admitting: Diagnostic Neuroimaging

## 2021-05-08 DIAGNOSIS — R202 Paresthesia of skin: Secondary | ICD-10-CM

## 2021-05-08 DIAGNOSIS — G4733 Obstructive sleep apnea (adult) (pediatric): Secondary | ICD-10-CM

## 2021-05-08 DIAGNOSIS — R200 Anesthesia of skin: Secondary | ICD-10-CM | POA: Diagnosis not present

## 2021-05-08 DIAGNOSIS — Z9884 Bariatric surgery status: Secondary | ICD-10-CM

## 2021-05-08 DIAGNOSIS — E8881 Metabolic syndrome: Secondary | ICD-10-CM

## 2021-05-08 DIAGNOSIS — M79641 Pain in right hand: Secondary | ICD-10-CM

## 2021-05-08 DIAGNOSIS — Z0289 Encounter for other administrative examinations: Secondary | ICD-10-CM

## 2021-05-08 NOTE — Procedures (Signed)
[facility]  NCS (NERVE CONDUCTION STUDY) WITH EMG (ELECTROMYOGRAPHY) REPORT   STUDY DATE: 05/08/21 PATIENT NAME: Crystal Escobar DOB: [birth year] MRN: [id number]  ORDERING CLINICIAN: Larey Seat, MD   TECHNOLOGIST: Sherre Scarlet ELECTROMYOGRAPHER: Earlean Polka. Ebonique Hallstrom, MD  CLINICAL INFORMATION: 51 year old female with bilateral hand numbness and tingling and pain.  History of right carpal tunnel syndrome release surgery.  FINDINGS: NERVE CONDUCTION STUDY:  Bilateral median and right ulnar motor responses are normal.  Right ulnar F-wave latency is normal.  Right median and right ulnar sensory responses are normal.  Left median sensory response has slightly prolonged peak latency and normal amplitude.  Right median to ulnar transcarpal comparison study shows slightly prolonged peak latency difference.   NEEDLE ELECTROMYOGRAPHY:  Needle examination of right upper extremity is normal.   IMPRESSION:   This study demonstrates: -Mild left median neuropathy at the wrist consistent with mild left carpal tunnel syndrome. -Very mild right median neuropathy wrist consistent with very mild right carpal tunnel syndrome; history of right carpal tunnel syndrome release surgery.    INTERPRETING PHYSICIAN:  Penni Bombard, MD Certified in Neurology, Neurophysiology and Neuroimaging  [facility] [address] [place], [place] [address] [phone]  [employer]    Nerve / Sites Muscle Latency Ref. Amplitude Ref. Rel Amp Segments Distance Velocity Ref. Area    ms ms mV mV %  cm m/s m/s mVms  R Median - APB     Wrist APB 3.8 ?4.4 8.9 ?4.0 100 Wrist - APB 7   32.9     Upper arm APB 7.7  8.7  97.7 Upper arm - Wrist 21 54 ?49 32.1  L Median - APB     Wrist APB 4.4 ?4.4 8.6 ?4.0 100 Wrist - APB 7   29.5     Upper arm APB 7.9  8.2  95.5 Upper arm - Wrist 22 61 ?49 29.8  R Ulnar - ADM     Wrist ADM 3.0 ?3.3 8.4 ?6.0 100 Wrist - ADM 7   23.5      B.Elbow ADM 6.1  6.4  76.5 B.Elbow - Wrist 16 51 ?49 20.1     A.Elbow ADM 8.0  5.3  82.7 A.Elbow - B.Elbow 10 52 ?49 14.0           SNC    Nerve / Sites Rec. Site Peak Lat Ref.  Amp Ref. Segments Distance Peak Diff Ref.    ms ms V V  cm ms ms  R Median, Ulnar - Transcarpal comparison     Median Palm Wrist 2.5 ?2.2 54 ?35 Median Palm - Wrist 8       Ulnar Palm Wrist 2.0 ?2.2 20 ?12 Ulnar Palm - Wrist 8          Median Palm - Ulnar Palm  0.5 ?0.4  R Median - Orthodromic (Dig II, Mid palm)     Dig II Wrist 3.3 ?3.4 10 ?10 Dig II - Wrist 13    L Median - Orthodromic (Dig II, Mid palm)     Dig II Wrist 4.1 ?3.4 10 ?10 Dig II - Wrist 13    R Ulnar - Orthodromic, (Dig V, Mid palm)     Dig V Wrist 2.8 ?3.1 12 ?5 Dig V - Wrist 47               F  Wave    Nerve F Lat Ref.   ms ms  R  Ulnar - ADM 28.1 ?32.0       EMG Summary Table    Spontaneous MUAP Recruitment  Muscle IA Fib PSW Fasc Other Amp Dur. Poly Pattern  R. Deltoid Normal None None None _______ Normal Normal Normal Normal  R. Biceps brachii Normal None None None _______ Normal Normal Normal Normal  R. Triceps brachii Normal None None None _______ Normal Normal Normal Normal  R. Flexor carpi radialis Normal None None None _______ Normal Normal Normal Normal  R. First dorsal interosseous Normal None None None _______ Normal Normal Normal Normal

## 2021-05-08 NOTE — Progress Notes (Signed)
° °  NEEDLE ELECTROMYOGRAPHY:  Needle examination of right upper extremity is normal.   IMPRESSION:  51 year old female with bilateral hand numbness and tingling and pain.  History of right carpal tunnel syndrome release surgery.  This study demonstrates: -Mild left median neuropathy at the wrist consistent with mild left carpal tunnel syndrome. -Very mild right median neuropathy wrist consistent with very mild right carpal tunnel syndrome; history of right carpal tunnel syndrome release surgery.    INTERPRETING PHYSICIAN:  Penni Bombard, MD

## 2021-05-14 ENCOUNTER — Encounter: Payer: Self-pay | Admitting: Neurology

## 2021-06-19 NOTE — Progress Notes (Signed)
Office Visit Note  Patient: Crystal Escobar             Date of Birth: May 26, 1970           MRN: [id number]             PCP: Glendale Chard, MD Referring: Glendale Chard, MD Visit Date: 07/02/2021 Occupation: _0 @  Subjective:  Pain in multiple joints  History of Present Illness: Crystal Escobar is a 51 y.o. female in consultation per request of her PCP.  According the patient she has had discomfort in her right hand off and on for the last 2 years.  She states the pain is mostly in her wrist joints.  She had right carpal tunnel release in the past.  She recently had nerve conduction velocities and was told that she had mild right carpal tunnel syndrome in the right hand and moderate carpal tunnel syndrome in the left hand.  She has been using braces when she is working on the computer.  She also describes pain and stiffness in her neck and trapezius region.  She has not noticed any swelling in her hands or fingers.  She has some discomfort in her knee joints.  She states she has seen an orthopedic surgeon in the past who told her that she had osteoarthritis.  None of the other joints are painful.  There is no personal or family history of psoriasis.  There is no family history of autoimmune disease.  She is gravida 2, para 2.  Has no history of DVTs.  Activities of Daily Living:  Patient reports morning stiffness for 5-10 minutes.   Patient Reports nocturnal pain.  Difficulty dressing/grooming: Denies Difficulty climbing stairs: Reports Difficulty getting out of chair: Denies Difficulty using hands for taps, buttons, cutlery, and/or writing: Reports  Review of Systems  Constitutional:  Positive for fatigue.  HENT:  Negative for mouth sores, mouth dryness and nose dryness.   Eyes:  Negative for pain, itching and dryness.  Respiratory:  Negative for shortness of breath and difficulty breathing.   Cardiovascular:  Negative for chest pain and palpitations.  Gastrointestinal:   Negative for blood in stool, constipation and diarrhea.  Endocrine: Negative for increased urination.  Genitourinary:  Negative for difficulty urinating.  Musculoskeletal:  Positive for joint pain, joint pain, myalgias, morning stiffness, muscle tenderness and myalgias. Negative for joint swelling.  Skin:  Negative for color change, rash, redness and sensitivity to sunlight.  Allergic/Immunologic: Negative for susceptible to infections.  Neurological:  Negative for dizziness, numbness, headaches, memory loss and weakness.  Hematological:  Positive for bruising/bleeding tendency. Negative for swollen glands.  Psychiatric/Behavioral:  Positive for sleep disturbance. Negative for depressed mood and confusion. The patient is not nervous/anxious.    PMFS History:  Patient Active Problem List   Diagnosis Date Noted   Pure hypercholesterolemia 04/19/2021   Polyclonal gammopathy determined by serum protein electrophoresis 04/19/2021   Right hand pain 02/20/2021   Status post bariatric surgery 02/20/2021   Special screening for malignant neoplasms, colon 07/09/2020   Morbid obesity ([place]) 07/09/2020   Morbid obesity with body mass index (BMI) of 60.0 to 69.9 in adult [facility]) 06/30/2018   Obesity with alveolar hypoventilation and body mass index (BMI) of 40 or greater ([place]) 06/30/2018   Carpal tunnel syndrome, left 05/26/2017   Morton's neuroma of left foot 05/26/2017   DUB (dysfunctional uterine bleeding) 12/22/2012   Obstructive sleep apnea 07/15/2006   HYPERLIPIDEMIA 12/08/4816   DYSMETABOLIC SYNDROME 56/31/4970  LIVER FUNCTION TESTS, ABNORMAL 06/07/2006   DIABETES MELLITUS, TYPE II, CONTROLLED 05/24/2006   Obesity 05/24/2006   TOBACCO ABUSE 05/24/2006   HYPERTENSION, BENIGN ESSENTIAL 05/24/2006   OSTEOARTHROSIS NOS, UNSPECIFIED SITE 05/24/2006   ARTHROSCOPY, LEFT KNEE, HX OF 05/24/2006    Past Medical History:  Diagnosis Date   Abnormal Pap smear 2011   Diabetes mellitus    DUB  (dysfunctional uterine bleeding) 12/22/2012   HLD (hyperlipidemia)    Hypertension    Sleep apnea    does not use machine as prescribed    Family History  Problem Relation Age of Onset   Heart attack Mother    Hypertension Mother    Diabetes Mother    Seizures Father    Hypertension Sister    Sickle cell anemia Maternal Aunt    Hypertension Maternal Aunt    Lung cancer Maternal Grandmother    Heart murmur Son    Colon cancer Neg Hx    Past Surgical History:  Procedure Laterality Date   CARPAL TUNNEL RELEASE Right 2016   CESAREAN SECTION     COLONOSCOPY WITH PROPOFOL N/A 09/10/2020   Procedure: COLONOSCOPY WITH PROPOFOL;  Surgeon: Eloise Harman, DO;  Location: [facility];  Service: Endoscopy;  Laterality: N/A;  AM (diabetic)   GASTRIC BYPASS  05/2019   [facility]: Dr. Shawna Orleans   KNEE SURGERY Left    cartilage   POLYPECTOMY  09/10/2020   Procedure: POLYPECTOMY;  Surgeon: Eloise Harman, DO;  Location: [facility];  Service: Endoscopy;;   TUBAL LIGATION     Social History   Social History Narrative   Lives with husband and 2 children   Right handed   Caffeine: 1 cup of coffee a day   Immunization History  Administered Date(s) Administered   Influenza Whole 05/24/2006   Influenza,inj,Quad PF,6+ Mos 02/18/2018, 01/10/2019, 02/13/2020, 04/16/2021   Influenza-Unspecified 01/10/2019, 01/26/2019   Moderna Sars-Covid-2 Vaccination 09/01/2019, 10/02/2019, 06/07/2020   Pneumococcal Polysaccharide-23 08/08/2020   Tdap 10/25/2018     Objective: Vital Signs: BP 123/90 (BP Location: Right Arm, Patient Position: Sitting, Cuff Size: Large)    Pulse 71    Ht 5' 2.5" (1.588 m)    Wt (!) 320 lb (145.2 kg)    BMI 57.60 kg/m    Physical Exam Vitals and nursing note reviewed.  Constitutional:      Appearance: She is well-developed.  HENT:     Head: Normocephalic and atraumatic.  Eyes:     Conjunctiva/sclera: Conjunctivae normal.  Cardiovascular:     Rate and Rhythm: Normal rate  and regular rhythm.     Heart sounds: Normal heart sounds.  Pulmonary:     Effort: Pulmonary effort is normal.     Breath sounds: Normal breath sounds.  Abdominal:     General: Bowel sounds are normal.     Palpations: Abdomen is soft.  Musculoskeletal:     Cervical back: Normal range of motion.  Lymphadenopathy:     Cervical: No cervical adenopathy.  Skin:    General: Skin is warm and dry.     Capillary Refill: Capillary refill takes less than 2 seconds.  Neurological:     Mental Status: She is alert and oriented to person, place, and time.  Psychiatric:        Behavior: Behavior normal.     Musculoskeletal Exam: She has stiffness with range of motion of the cervical spine.  Shoulder joints, elbow joints, wrist joints with good range of motion.  She has  surgical scar on her right wrist from previous carpal tunnel release.  PIP and DIP thickening was noted.  No synovitis was noted.  Hip joints were in good range of motion.  She had right knee joint valgus deformity.  Bilateral knee joints had discomfort with range of motion without any warmth swelling or effusion.  There was no tenderness over ankles or MTPs.  CDAI Exam: CDAI Score: -- Patient Global: --; Provider Global: -- Swollen: --; Tender: -- Joint Exam 07/02/2021   No joint exam has been documented for this visit   There is currently no information documented on the homunculus. Go to the Rheumatology activity and complete the homunculus joint exam.  Investigation: No additional findings.  Imaging: No results found.  Recent Labs: Lab Results  Component Value Date   WBC 5.9 08/08/2020   HGB 13.1 08/08/2020   PLT 358 08/08/2020   NA 141 04/16/2021   K 4.7 04/16/2021   CL 107 (H) 04/16/2021   CO2 24 04/16/2021   GLUCOSE 82 04/16/2021   BUN 9 04/16/2021   CREATININE 0.76 04/16/2021   BILITOT 0.2 04/16/2021   ALKPHOS 129 (H) 04/16/2021   AST 16 04/16/2021   ALT 17 04/16/2021   PROT 7.8 04/16/2021   ALBUMIN 4.2  04/16/2021   CALCIUM 9.3 04/16/2021   GFRAA 126 05/09/2020    Speciality Comments: No specialty comments available.  Procedures:  No procedures performed Allergies: Patient has no known allergies.   Assessment / Plan:     Visit Diagnoses: Pain in both hands -she complains of pain and discomfort in her bilateral hands over the last 2 years.  She had bilateral PIP and DIP thickening.  No synovitis was noted.  All autoimmune work-up was negative.  Her sed rate was elevated in October 2022.  We will repeat sed rate and SPEP today.  Detailed counseling on osteoarthritis was provided.  Joint protection muscle strengthening was discussed.  A handout on hand exercises was given.  02/20/21: CRP 6, ESR 68, ANA negative, homocysteine 8.4, Ro-, La-.  04/16/21: anti-CCP 5, RF<10 - Plan: XR Hand 2 View Right, XR Hand 2 View Left.  X-rays of bilateral hands were consistent with osteoarthritis.  A handout on hand exercises was given.  Carpal tunnel syndrome, left-patient states that she had nerve conduction velocity test and had to moderate carpal tunnel syndrome.  She has been using carpal tunnel brace.  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  Primary osteoarthritis of both knees -she has knee joint discomfort in bilateral knee joints for many years.  She has seen Dr. Junius Roads in the past.  She had no warmth swelling or effusion on my examination.  I reviewed x-rays done by Dr. Junius Roads in 2020 showed severe lateral compartment narrowing of the left knee joint and moderate medial compartment narrowing of the right knee.  Neck pain -she has been experiencing neck pain and stiffness.  She had bilateral trapezius spasm.  Plan: XR Cervical Spine 2 or 3 views.  Multilevel spondylosis with significant narrowing between C3-C4, C4-C5, C5-C6 was noted.  A handout on C-spine exercises was given.  Elevated sed rate - Plan: Sedimentation rate, Serum protein electrophoresis with reflex  Essential  hypertension-blood pressure was normal today.  Other medical problems are listed as follows:  History of type 2 diabetes mellitus  History of hyperlipidemia  Status post bariatric surgery  Polyclonal gammopathy determined by serum protein electrophoresis  Obstructive sleep apnea  Former smoker  Orders: Orders Placed This Encounter  Procedures   XR Hand 2 View Right   XR Hand 2 View Left   XR Cervical Spine 2 or 3 views   Sedimentation rate   Serum protein electrophoresis with reflex   No orders of the defined types were placed in this encounter.   Follow-Up Instructions: Return for Pain in joints.   Bo Merino, MD  Note - This record has been created using Editor, commissioning.  Chart creation errors have been sought, but may not always  have been located. Such creation errors do not reflect on  the standard of medical care.

## 2021-07-02 ENCOUNTER — Encounter: Payer: Self-pay | Admitting: Rheumatology

## 2021-07-02 ENCOUNTER — Ambulatory Visit: Payer: Self-pay

## 2021-07-02 ENCOUNTER — Other Ambulatory Visit: Payer: Self-pay

## 2021-07-02 ENCOUNTER — Ambulatory Visit: Payer: BC Managed Care – PPO | Admitting: Rheumatology

## 2021-07-02 VITALS — BP 123/90 | HR 71 | Ht 62.5 in | Wt 320.0 lb

## 2021-07-02 DIAGNOSIS — M170 Bilateral primary osteoarthritis of knee: Secondary | ICD-10-CM | POA: Diagnosis not present

## 2021-07-02 DIAGNOSIS — R700 Elevated erythrocyte sedimentation rate: Secondary | ICD-10-CM

## 2021-07-02 DIAGNOSIS — G5762 Lesion of plantar nerve, left lower limb: Secondary | ICD-10-CM

## 2021-07-02 DIAGNOSIS — Z8639 Personal history of other endocrine, nutritional and metabolic disease: Secondary | ICD-10-CM

## 2021-07-02 DIAGNOSIS — Z9884 Bariatric surgery status: Secondary | ICD-10-CM

## 2021-07-02 DIAGNOSIS — M79642 Pain in left hand: Secondary | ICD-10-CM

## 2021-07-02 DIAGNOSIS — Z87891 Personal history of nicotine dependence: Secondary | ICD-10-CM

## 2021-07-02 DIAGNOSIS — G4733 Obstructive sleep apnea (adult) (pediatric): Secondary | ICD-10-CM

## 2021-07-02 DIAGNOSIS — G5602 Carpal tunnel syndrome, left upper limb: Secondary | ICD-10-CM

## 2021-07-02 DIAGNOSIS — M542 Cervicalgia: Secondary | ICD-10-CM

## 2021-07-02 DIAGNOSIS — I10 Essential (primary) hypertension: Secondary | ICD-10-CM

## 2021-07-02 DIAGNOSIS — M79641 Pain in right hand: Secondary | ICD-10-CM

## 2021-07-02 DIAGNOSIS — D890 Polyclonal hypergammaglobulinemia: Secondary | ICD-10-CM

## 2021-07-02 NOTE — Patient Instructions (Signed)
Cervical Strain and Sprain Rehab Ask your health care provider which exercises are safe for you. Do exercises exactly as told by your health care provider and adjust them as directed. It is normal to feel mild stretching, pulling, tightness, or discomfort as you do these exercises. Stop right away if you feel sudden pain or your pain gets worse. Do not begin these exercises until told by your health care provider. Stretching and range-of-motion exercises Cervical side bending  Using good posture, sit on a stable chair or stand up. Without moving your shoulders, slowly tilt your left / right ear to your shoulder until you feel a stretch in the opposite side neck muscles. You should be looking straight ahead. Hold for __________ seconds. Repeat with the other side of your neck. Repeat __________ times. Complete this exercise __________ times a day. Cervical rotation  Using good posture, sit on a stable chair or stand up. Slowly turn your head to the side as if you are looking over your left / right shoulder. Keep your eyes level with the ground. Stop when you feel a stretch along the side and the back of your neck. Hold for __________ seconds. Repeat this by turning to your other side. Repeat __________ times. Complete this exercise __________ times a day. Thoracic extension and pectoral stretch Roll a towel or a small blanket so it is about 4 inches (10 cm) in diameter. Lie down on your back on a firm surface. Put the towel lengthwise, under your spine in the middle of your back. It should not be under your shoulder blades. The towel should line up with your spine from your middle back to your lower back. Put your hands behind your head and let your elbows fall out to your sides. Hold for __________ seconds. Repeat __________ times. Complete this exercise __________ times a day. Strengthening exercises Isometric upper cervical flexion Lie on your back with a thin pillow behind your head  and a small rolled-up towel under your neck. Gently tuck your chin toward your chest and nod your head down to look toward your feet. Do not lift your head off the pillow. Hold for __________ seconds. Release the tension slowly. Relax your neck muscles completely before you repeat this exercise. Repeat __________ times. Complete this exercise __________ times a day. Isometric cervical extension  Stand about 6 inches (15 cm) away from a wall, with your back facing the wall. Place a soft object, about 6-8 inches (15-20 cm) in diameter, between the back of your head and the wall. A soft object could be a small pillow, a ball, or a folded towel. Gently tilt your head back and press into the soft object. Keep your jaw and forehead relaxed. Hold for __________ seconds. Release the tension slowly. Relax your neck muscles completely before you repeat this exercise. Repeat __________ times. Complete this exercise __________ times a day. Posture and body mechanics Body mechanics refers to the movements and positions of your body while you do your daily activities. Posture is part of body mechanics. Good posture and healthy body mechanics can help to relieve stress in your body's tissues and joints. Good posture means that your spine is in its natural S-curve position (your spine is neutral), your shoulders are pulled back slightly, and your head is not tipped forward. The following are general guidelines for applying improved posture and body mechanics to your everyday activities. Sitting  When sitting, keep your spine neutral and keep your feet flat on the floor.  Use a footrest, if necessary, and keep your thighs parallel to the floor. Avoid rounding your shoulders, and avoid tilting your head forward. When working at a desk or a computer, keep your desk at a height where your hands are slightly lower than your elbows. Slide your chair under your desk so you are close enough to maintain good posture. When  working at a computer, place your monitor at a height where you are looking straight ahead and you do not have to tilt your head forward or downward to look at the screen. Standing  When standing, keep your spine neutral and keep your feet about hip-width apart. Keep a slight bend in your knees. Your ears, shoulders, and hips should line up. When you do a task in which you stand in one place for a long time, place one foot up on a stable object that is 2-4 inches (5-10 cm) high, such as a footstool. This helps keep your spine neutral. Resting When lying down and resting, avoid positions that are most painful for you. Try to support your neck in a neutral position. You can use a contour pillow or a small rolled-up towel. Your pillow should support your neck but not push on it. This information is not intended to replace advice given to you by your health care provider. Make sure you discuss any questions you have with your health care provider. Document Revised: 08/03/2018 Document Reviewed: 01/12/2018 Elsevier Patient Education  [place] Exercises Hand exercises can be helpful for almost anyone. These exercises can strengthen the hands, improve flexibility and movement, and increase blood flow to the hands. These results can make work and daily tasks easier. Hand exercises can be especially helpful for people who have joint pain from arthritis or have nerve damage from overuse (carpal tunnel syndrome). These exercises can also help people who have injured a hand. Exercises Most of these hand exercises are gentle stretching and motion exercises. It is usually safe to do them often throughout the day. Warming up your hands before exercise may help to reduce stiffness. You can do this with gentle massage or by placing your hands in warm water for 10-15 minutes. It is normal to feel some stretching, pulling, tightness, or mild discomfort as you begin new exercises. This will gradually  improve. Stop an exercise right away if you feel sudden, severe pain or your pain gets worse. Ask your health care provider which exercises are best for you. Knuckle bend or "claw" fist  Stand or sit with your arm, hand, and all five fingers pointed straight up. Make sure to keep your wrist straight during the exercise. Gently bend your fingers down toward your palm until the tips of your fingers are touching the top of your palm. Keep your big knuckle straight and just bend the small knuckles in your fingers. Hold this position for __________ seconds. Straighten (extend) your fingers back to the starting position. Repeat this exercise 5-10 times with each hand. Full finger fist  Stand or sit with your arm, hand, and all five fingers pointed straight up. Make sure to keep your wrist straight during the exercise. Gently bend your fingers into your palm until the tips of your fingers are touching the middle of your palm. Hold this position for __________ seconds. Extend your fingers back to the starting position, stretching every joint fully. Repeat this exercise 5-10 times with each hand. Straight fist Stand or sit with your arm, hand, and all five fingers  pointed straight up. Make sure to keep your wrist straight during the exercise. Gently bend your fingers at the big knuckle, where your fingers meet your hand, and the middle knuckle. Keep the knuckle at the tips of your fingers straight and try to touch the bottom of your palm. Hold this position for __________ seconds. Extend your fingers back to the starting position, stretching every joint fully. Repeat this exercise 5-10 times with each hand. Tabletop  Stand or sit with your arm, hand, and all five fingers pointed straight up. Make sure to keep your wrist straight during the exercise. Gently bend your fingers at the big knuckle, where your fingers meet your hand, as far down as you can while keeping the small knuckles in your fingers  straight. Think of forming a tabletop with your fingers. Hold this position for __________ seconds. Extend your fingers back to the starting position, stretching every joint fully. Repeat this exercise 5-10 times with each hand. Finger spread  Place your hand flat on a table with your palm facing down. Make sure your wrist stays straight as you do this exercise. Spread your fingers and thumb apart from each other as far as you can until you feel a gentle stretch. Hold this position for __________ seconds. Bring your fingers and thumb tight together again. Hold this position for __________ seconds. Repeat this exercise 5-10 times with each hand. Making circles  Stand or sit with your arm, hand, and all five fingers pointed straight up. Make sure to keep your wrist straight during the exercise. Make a circle by touching the tip of your thumb to the tip of your index finger. Hold for __________ seconds. Then open your hand wide. Repeat this motion with your thumb and each finger on your hand. Repeat this exercise 5-10 times with each hand. Thumb motion  Sit with your forearm resting on a table and your wrist straight. Your thumb should be facing up toward the ceiling. Keep your fingers relaxed as you move your thumb. Lift your thumb up as high as you can toward the ceiling. Hold for __________ seconds. Bend your thumb across your palm as far as you can, reaching the tip of your thumb for the small finger (pinkie) side of your palm. Hold for __________ seconds. Repeat this exercise 5-10 times with each hand. Grip strengthening  Hold a stress ball or other soft ball in the middle of your hand. Slowly increase the pressure, squeezing the ball as much as you can without causing pain. Think of bringing the tips of your fingers into the middle of your palm. All of your finger joints should bend when doing this exercise. Hold your squeeze for __________ seconds, then relax. Repeat this exercise  5-10 times with each hand. Contact a health care provider if: Your hand pain or discomfort gets much worse when you do an exercise. Your hand pain or discomfort does not improve within 2 hours after you exercise. If you have any of these problems, stop doing these exercises right away. Do not do them again unless your health care provider says that you can. Get help right away if: You develop sudden, severe hand pain or swelling. If this happens, stop doing these exercises right away. Do not do them again unless your health care provider says that you can. This information is not intended to replace advice given to you by your health care provider. Make sure you discuss any questions you have with your health care provider. Document  Revised: 08/01/2020 Document Reviewed: 08/01/2020 Elsevier Patient Education  [place].

## 2021-07-07 LAB — PROTEIN ELECTROPHORESIS, SERUM, WITH REFLEX
Albumin ELP: 3.6 g/dL — ABNORMAL LOW (ref 3.8–4.8)
Alpha 1: 0.3 g/dL (ref 0.2–0.3)
Alpha 2: 1.1 g/dL — ABNORMAL HIGH (ref 0.5–0.9)
Beta 2: 0.3 g/dL (ref 0.2–0.5)
Beta Globulin: 0.4 g/dL (ref 0.4–0.6)
Gamma Globulin: 1.8 g/dL — ABNORMAL HIGH (ref 0.8–1.7)
Total Protein: 7.4 g/dL (ref 6.1–8.1)

## 2021-07-07 LAB — SEDIMENTATION RATE: Sed Rate: 29 mm/h — ABNORMAL HIGH (ref 0–20)

## 2021-07-08 NOTE — Progress Notes (Signed)
SPEP is normal.  Sed rate is mildly elevated and improved.

## 2021-07-15 ENCOUNTER — Other Ambulatory Visit: Payer: Self-pay | Admitting: Internal Medicine

## 2021-07-23 ENCOUNTER — Ambulatory Visit: Payer: BC Managed Care – PPO | Admitting: Rheumatology

## 2021-08-11 ENCOUNTER — Encounter: Payer: BC Managed Care – PPO | Admitting: Internal Medicine

## 2021-08-20 ENCOUNTER — Ambulatory Visit (INDEPENDENT_AMBULATORY_CARE_PROVIDER_SITE_OTHER): Payer: BC Managed Care – PPO | Admitting: Internal Medicine

## 2021-08-20 ENCOUNTER — Encounter: Payer: Self-pay | Admitting: Internal Medicine

## 2021-08-20 VITALS — BP 126/82 | HR 90 | Temp 97.4°F | Ht 62.2 in | Wt 317.8 lb

## 2021-08-20 DIAGNOSIS — I10 Essential (primary) hypertension: Secondary | ICD-10-CM

## 2021-08-20 DIAGNOSIS — Z6841 Body Mass Index (BMI) 40.0 and over, adult: Secondary | ICD-10-CM | POA: Diagnosis not present

## 2021-08-20 DIAGNOSIS — E1165 Type 2 diabetes mellitus with hyperglycemia: Secondary | ICD-10-CM

## 2021-08-20 MED ORDER — MOUNJARO 15 MG/0.5ML ~~LOC~~ SOAJ: 15.0000 mg | SUBCUTANEOUS | 3 refills | Status: DC

## 2021-08-20 NOTE — Patient Instructions (Signed)

## 2021-08-20 NOTE — Progress Notes (Signed)
?[name],acting as a Education administrator for [name], MD.,have documented all relevant documentation on the behalf of [name], MD,as directed by  [name], MD while in the presence of [name], MD.  ?This visit occurred during the SARS-CoV-2 public health emergency.  Safety protocols were in place, including screening questions prior to the visit, additional usage of staff PPE, and extensive cleaning of exam room while observing appropriate contact time as indicated for disinfecting solutions. ? ?Subjective:  ?  ? Patient ID: Crystal Escobar , female    DOB: 06/24/1970 , 51 y.o.   MRN: [id number] ? ? ?Chief Complaint  ?Patient presents with  ? Diabetes  ? Hypertension  ? ? ?HPI ? ?Patient is here today for DM/HTN f/u. She reports compliance with meds. She denies headaches, chest pain and shortness of breath. She is currently on Mounjaro 12.$RemoveBefore'5mg'KyFxtVPBweJeZ$  weekly. She hasn't had any issues with the medication. She is now exercising 3 days per week.  ? ?Diabetes ?She presents for her follow-up diabetic visit. She has type 2 diabetes mellitus. There are no hypoglycemic associated symptoms. There are no diabetic associated symptoms. Pertinent negatives for diabetes include no blurred vision. There are no hypoglycemic complications. Risk factors for coronary artery disease include diabetes mellitus, dyslipidemia, hypertension, obesity and sedentary lifestyle. When asked about current treatments, none were reported. She is compliant with treatment some of the time. She is following a generally healthy diet. She participates in exercise intermittently. An ACE inhibitor/angiotensin II receptor blocker is being taken.  ?Hypertension ?This is a chronic problem. The current episode started more than 1 year ago. The problem has been gradually improving since onset. The problem is uncontrolled. Pertinent negatives include no blurred vision. Risk factors for coronary artery disease include obesity, sedentary  lifestyle and diabetes mellitus. Past treatments include angiotensin blockers. The current treatment provides moderate improvement.   ? ?Past Medical History:  ?Diagnosis Date  ? Abnormal Pap smear 2011  ? Diabetes mellitus   ? DUB (dysfunctional uterine bleeding) 12/22/2012  ? HLD (hyperlipidemia)   ? Hypertension   ? Sleep apnea   ? does not use machine as prescribed  ?  ? ?Family History  ?Problem Relation Age of Onset  ? Heart attack Mother   ? Hypertension Mother   ? Diabetes Mother   ? Seizures Father   ? Hypertension Sister   ? Sickle cell anemia Maternal Aunt   ? Hypertension Maternal Aunt   ? Lung cancer Maternal Grandmother   ? Heart murmur Son   ? Colon cancer Neg Hx   ? ? ? ?Current Outpatient Medications:  ?  CALCIUM PO, Take by mouth daily., Disp: , Rfl:  ?  cetirizine (ZYRTEC) 10 MG tablet, Take 1 tablet (10 mg total) by mouth daily as needed for allergies., Disp: 30 tablet, Rfl: 4 ?  Cholecalciferol (VITAMIN D3) 125 MCG (5000 UT) CAPS, Take 5,000 Units by mouth daily., Disp: , Rfl:  ?  losartan (COZAAR) 50 MG tablet, Take 1 tablet by mouth once daily, Disp: 90 tablet, Rfl: 0 ?  Multiple Vitamin (MULTIVITAMIN) capsule, Take 1 capsule by mouth daily., Disp: , Rfl:  ?  Probiotic Product (PROBIOTIC PO), Take 1 capsule by mouth daily., Disp: , Rfl:  ?  tirzepatide (MOUNJARO) 15 MG/0.5ML Pen, Inject 15 mg into the skin once a week., Disp: 2 mL, Rfl: 3  ? ?No Known Allergies  ? ?Review of Systems  ?Constitutional: Negative.   ?Eyes:  Negative  for blurred vision.  ?Respiratory: Negative.    ?Cardiovascular: Negative.   ?Gastrointestinal: Negative.   ?Neurological: Negative.   ?Psychiatric/Behavioral: Negative.     ? ?Today's Vitals  ? 08/20/21 1622  ?BP: 126/82  ?Pulse: 90  ?Temp: (!) 97.4 ?F (36.3 ?C)  ?Weight: (!) 317 lb 12.8 oz (144.2 kg)  ?Height: 5' 2.2" (1.58 m)  ?PainSc: 0-No pain  ? ?Body mass index is 57.75 kg/m?.  ?Wt Readings from Last 3 Encounters:  ?08/20/21 (!) 317 lb 12.8 oz (144.2 kg)   ?07/02/21 (!) 320 lb (145.2 kg)  ?04/16/21 (!) 310 lb 9.6 oz (140.9 kg)  ?  ? ?Objective:  ?Physical Exam ?Vitals and nursing note reviewed.  ?Constitutional:   ?   Appearance: Normal appearance.  ?HENT:  ?   Head: Normocephalic and atraumatic.  ?Eyes:  ?   Extraocular Movements: Extraocular movements intact.  ?Cardiovascular:  ?   Rate and Rhythm: Normal rate and regular rhythm.  ?   Heart sounds: Normal heart sounds.  ?Pulmonary:  ?   Effort: Pulmonary effort is normal.  ?   Breath sounds: Normal breath sounds.  ?Musculoskeletal:  ?   Cervical back: Normal range of motion.  ?Skin: ?   General: Skin is warm.  ?Neurological:  ?   General: No focal deficit present.  ?   Mental Status: She is alert.  ?Psychiatric:     ?   Mood and Affect: Mood normal.     ?   Behavior: Behavior normal.  ?   ?Assessment And Plan:  ?   ?1. Uncontrolled type 2 diabetes mellitus with hyperglycemia ([place]) ?Comments: Chronic, I will check labs today. I will increase Mounjaro to $RemoveBef'15mg'NQFwcIGBUt$  weekly. Advised to aim to exercise. She will f/u in 3-4 months for re-evaluation.  ?- BMP8+eGFR ?- Hemoglobin A1c ?- Urine Albumin-Creatinine with uACR ? ?2. HYPERTENSION, BENIGN ESSENTIAL ?Comments: Chronic, well controlled.  She will c/w losartan $RemoveBefo'50mg'gbbdCZOICit$  daily. She is encouraged to follow a low sodium diet.  ? ?3. Class 3 severe obesity due to excess calories with serious comorbidity and body mass index (BMI) of 50.0 to 59.9 in adult [facility]) ?Comments: BMI 57.  She is encouraged to increase exercise to five days weekly, while initially striving for BMI<50 to decrease cardiac risk.  ?  ?Patient was given opportunity to ask questions. Patient verbalized understanding of the plan and was able to repeat key elements of the plan. All questions were answered to their satisfaction.  ? ?I, [name], MD, have reviewed all documentation for this visit. The documentation on 08/20/21 for the exam, diagnosis, procedures, and orders are all accurate and complete.  ? ?IF  YOU HAVE BEEN REFERRED TO A SPECIALIST, IT MAY TAKE 1-2 WEEKS TO SCHEDULE/PROCESS THE REFERRAL. IF YOU HAVE NOT HEARD FROM US/SPECIALIST IN TWO WEEKS, PLEASE GIVE [place] A CALL AT [phone] X [phone].  ? ?THE PATIENT IS ENCOURAGED TO PRACTICE SOCIAL DISTANCING DUE TO THE COVID-19 PANDEMIC.   ?

## 2021-08-20 NOTE — Progress Notes (Signed)
? ?Office Visit Note ? ?Patient: Crystal Escobar             ?Date of Birth: 1970-06-06           ?MRN: [id number]             ?PCP: Glendale Chard, MD ?Referring: Glendale Chard, MD ?Visit Date: 09/03/2021 ?Occupation: @GUAROCC @ ? ?Subjective:  ?Pain in multiple joints ? ?History of Present Illness: Crystal Escobar is a 51 y.o. female with history of osteoarthritis.  She returns for follow-up visit today.  She states she continues to have pain and discomfort in her left shoulder.  She has had recurrent discomfort in her left shoulder joint.  She also has discomfort in her bilateral hands and her bilateral knee joints.  She has difficulty climbing stairs and getting up from the chair.  She continues to have a stiffness and discomfort in her cervical region.  She denies any history of joint swelling. ? ?Activities of Daily Living:  ?Patient reports morning stiffness for 10-15 minutes.   ?Patient Reports nocturnal pain.  ?Difficulty dressing/grooming: Denies ?Difficulty climbing stairs: Reports ?Difficulty getting out of chair: Reports ?Difficulty using hands for taps, buttons, cutlery, and/or writing: Reports ? ?Review of Systems  ?Constitutional:  Positive for fatigue.  ?HENT:  Positive for mouth dryness. Negative for mouth sores and nose dryness.   ?Eyes:  Positive for itching. Negative for pain and dryness.  ?Respiratory:  Negative for shortness of breath and difficulty breathing.   ?Cardiovascular:  Negative for chest pain and palpitations.  ?Gastrointestinal:  Negative for blood in stool, constipation and diarrhea.  ?Endocrine: Negative for increased urination.  ?Genitourinary:  Negative for difficulty urinating.  ?Musculoskeletal:  Positive for joint pain, joint pain, myalgias, morning stiffness and myalgias. Negative for joint swelling and muscle tenderness.  ?Skin:  Negative for color change, rash and redness.  ?Allergic/Immunologic: Negative for susceptible to infections.  ?Neurological:  Positive for  headaches. Negative for dizziness, numbness, memory loss and weakness.  ?Hematological:  Positive for bruising/bleeding tendency.  ?Psychiatric/Behavioral:  Negative for confusion.   ? ?PMFS History:  ?Patient Active Problem List  ? Diagnosis Date Noted  ? Pure hypercholesterolemia 04/19/2021  ? 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 Right hand pain 02/20/2021  ? Status post bariatric surgery 02/20/2021  ? Special screening for malignant neoplasms, colon 07/09/2020  ? Morbid obesity ([place]) 07/09/2020  ? Morbid obesity with body mass index (BMI) of 60.0 to 69.9 in adult [facility]) 06/30/2018  ? Obesity with alveolar hypoventilation and body mass index (BMI) of 40 or greater ([place]) 06/30/2018  ? Carpal tunnel syndrome, left 05/26/2017  ? Morton's neuroma of left foot 05/26/2017  ? DUB (dysfunctional uterine bleeding) 12/22/2012  ? Obstructive sleep apnea 07/15/2006  ? HYPERLIPIDEMIA 06/07/2006  ? DYSMETABOLIC SYNDROME 81/82/9937  ? LIVER FUNCTION TESTS, ABNORMAL 06/07/2006  ? DIABETES MELLITUS, TYPE II, CONTROLLED 05/24/2006  ? Obesity 05/24/2006  ? TOBACCO ABUSE 05/24/2006  ? HYPERTENSION, BENIGN ESSENTIAL 05/24/2006  ? OSTEOARTHROSIS NOS, UNSPECIFIED SITE 05/24/2006  ? ARTHROSCOPY, LEFT KNEE, HX OF 05/24/2006  ?  ?Past Medical History:  ?Diagnosis Date  ? Abnormal Pap smear 2011  ? Diabetes mellitus   ? DUB (dysfunctional uterine bleeding) 12/22/2012  ? HLD (hyperlipidemia)   ? Hypertension   ? Sleep apnea   ? does not use machine as prescribed  ?  ?Family History  ?Problem Relation Age of Onset  ? Heart attack Mother   ? Hypertension Mother   ?  Diabetes Mother   ? Seizures Father   ? Hypertension Sister   ? Sickle cell anemia Maternal Aunt   ? Hypertension Maternal Aunt   ? Lung cancer Maternal Grandmother   ? Heart murmur Son   ? Colon cancer Neg Hx   ? ?Past Surgical History:  ?Procedure Laterality Date  ? CARPAL TUNNEL RELEASE Right 2016  ? CESAREAN SECTION    ? COLONOSCOPY WITH  PROPOFOL N/A 09/10/2020  ? Procedure: COLONOSCOPY WITH PROPOFOL;  Surgeon: Eloise Harman, DO;  [facility];  Service: Endoscopy;  Laterality: N/A;  AM (diabetic)  ? GASTRIC BYPASS  05/2019  ? [facility]: Dr. Shawna Orleans  ? KNEE SURGERY Left   ? cartilage  ? POLYPECTOMY  09/10/2020  ? Procedure: POLYPECTOMY;  Surgeon: Eloise Harman, DO;  Location: [facility];  Service: Endoscopy;;  ? TUBAL LIGATION    ? ?Social History  ? ?Social History Narrative  ? Lives with husband and 2 children  ? Right handed  ? Caffeine: 1 cup of coffee a day  ? ?Immunization History  ?Administered Date(s) Administered  ? Influenza Whole 05/24/2006  ? Influenza,inj,Quad PF,6+ Mos 02/18/2018, 01/10/2019, 02/13/2020, 04/16/2021  ? Influenza-Unspecified 01/10/2019, 01/26/2019  ? Moderna Sars-Covid-2 Vaccination 09/01/2019, 10/02/2019, 06/07/2020  ? Pneumococcal Polysaccharide-23 08/08/2020  ? Tdap 10/25/2018  ?  ? ?Objective: ?Vital Signs: BP 108/78 (BP Location: Left Arm, Patient Position: Sitting, Cuff Size: Large)   Pulse (!) 102   Ht $R'5\' 2"'BV$  (1.575 m)   Wt (!) 322 lb 6.4 oz (146.2 kg)   BMI 58.97 kg/m?   ? ?Physical Exam ?Vitals and nursing note reviewed.  ?Constitutional:   ?   Appearance: She is well-developed.  ?HENT:  ?   Head: Normocephalic and atraumatic.  ?Eyes:  ?   Conjunctiva/sclera: Conjunctivae normal.  ?Cardiovascular:  ?   Rate and Rhythm: Normal rate and regular rhythm.  ?   Heart sounds: Normal heart sounds.  ?Pulmonary:  ?   Effort: Pulmonary effort is normal.  ?   Breath sounds: Normal breath sounds.  ?Abdominal:  ?   General: Bowel sounds are normal.  ?   Palpations: Abdomen is soft.  ?Musculoskeletal:  ?   Cervical back: Normal range of motion.  ?Lymphadenopathy:  ?   Cervical: No cervical adenopathy.  ?Skin: ?   General: Skin is warm and dry.  ?   Capillary Refill: Capillary refill takes less than 2 seconds.  ?Neurological:  ?   Mental Status: She is alert and oriented to person, place, and time.  ?Psychiatric:      ?   Behavior: Behavior normal.  ?  ? ?Musculoskeletal Exam: She had good range of motion of the cervical spine with some discomfort.  She had good range of motion of bilateral shoulder joints.  She had tenderness over left bicipital tendon.  Elbow joints and wrist joints with good range of motion.  She had bilateral PIP and DIP thickening with no synovitis.  Hip joints with good range of motion.  She has valgus deformity in her left knee joint.  No warmth swelling or effusion was noted.  There was no tenderness over ankles or MTPs. ? ?CDAI Exam: ?CDAI Score: -- ?Patient Global: --; Provider Global: -- ?Swollen: --; Tender: -- ?Joint Exam 09/03/2021  ? ?No joint exam has been documented for this visit  ? ?There is currently no information documented on the homunculus. Go to the Rheumatology activity and complete the homunculus joint exam. ? ?Investigation: ?  No additional findings. ? ?Imaging: ?No results found. ? ?Recent Labs: ?Lab Results  ?Component Value Date  ? WBC 5.9 08/08/2020  ? HGB 13.1 08/08/2020  ? PLT 358 08/08/2020  ? NA 140 08/20/2021  ? K 4.5 08/20/2021  ? CL 106 08/20/2021  ? CO2 20 08/20/2021  ? GLUCOSE 91 08/20/2021  ? BUN 12 08/20/2021  ? CREATININE 0.71 08/20/2021  ? BILITOT 0.2 04/16/2021  ? ALKPHOS 129 (H) 04/16/2021  ? AST 16 04/16/2021  ? ALT 17 04/16/2021  ? PROT 7.4 07/02/2021  ? ALBUMIN 4.2 04/16/2021  ? CALCIUM 9.3 08/20/2021  ? GFRAA 126 05/09/2020  ? ? ?02/20/21: CRP 6, ESR 68, ANA negative, homocysteine 8.4, Ro-, La-.  ? 04/16/21: anti-CCP 5, RF<10 ?July 02, 2021 ESR 29, CRP 0.3 ? ?Speciality Comments: No specialty comments available. ? ?Procedures:  ?No procedures performed ?Allergies: Patient has no known allergies.  ? ?Assessment / Plan:     ?Visit Diagnoses: Primary osteoarthritis of both hands -she continues to have pain and discomfort in her bilateral hands.  No synovitis was noted.  Clinical and radiographic findings were consistent with osteoarthritis.  X-ray findings were  discussed with the patient.  All autoimmune work-up has been negative in the past.  Repeat sedimentation rate is normal. ? ?Carpal tunnel syndrome, left - History of moderate carpal tunnel syndrome per patient.

## 2021-08-21 ENCOUNTER — Encounter: Payer: Self-pay | Admitting: Internal Medicine

## 2021-08-21 LAB — BMP8+EGFR
BUN/Creatinine Ratio: 17 (ref 9–23)
BUN: 12 mg/dL (ref 6–24)
CO2: 20 mmol/L (ref 20–29)
Calcium: 9.3 mg/dL (ref 8.7–10.2)
Chloride: 106 mmol/L (ref 96–106)
Creatinine, Ser: 0.71 mg/dL (ref 0.57–1.00)
Glucose: 91 mg/dL (ref 70–99)
Potassium: 4.5 mmol/L (ref 3.5–5.2)
Sodium: 140 mmol/L (ref 134–144)
eGFR: 105 mL/min/{1.73_m2} (ref >59)

## 2021-08-21 LAB — HEMOGLOBIN A1C
Est. average glucose Bld gHb Est-mCnc: 117 mg/dL
Hgb A1c MFr Bld: 5.7 % — ABNORMAL HIGH (ref 4.8–5.6)

## 2021-08-21 LAB — MICROALBUMIN / CREATININE URINE RATIO
Creatinine, Urine: 72.7 mg/dL
Microalb/Creat Ratio: 198 mg/g creat — ABNORMAL HIGH (ref 0–29)
Microalbumin, Urine: 143.9 ug/mL

## 2021-09-03 ENCOUNTER — Ambulatory Visit: Payer: BC Managed Care – PPO | Admitting: Rheumatology

## 2021-09-03 ENCOUNTER — Encounter: Payer: Self-pay | Admitting: Rheumatology

## 2021-09-03 VITALS — BP 108/78 | HR 102 | Ht 62.0 in | Wt 322.4 lb

## 2021-09-03 DIAGNOSIS — R700 Elevated erythrocyte sedimentation rate: Secondary | ICD-10-CM

## 2021-09-03 DIAGNOSIS — M19042 Primary osteoarthritis, left hand: Secondary | ICD-10-CM

## 2021-09-03 DIAGNOSIS — M19041 Primary osteoarthritis, right hand: Secondary | ICD-10-CM | POA: Diagnosis not present

## 2021-09-03 DIAGNOSIS — Z87891 Personal history of nicotine dependence: Secondary | ICD-10-CM

## 2021-09-03 DIAGNOSIS — M5030 Other cervical disc degeneration, unspecified cervical region: Secondary | ICD-10-CM

## 2021-09-03 DIAGNOSIS — G4733 Obstructive sleep apnea (adult) (pediatric): Secondary | ICD-10-CM

## 2021-09-03 DIAGNOSIS — G5602 Carpal tunnel syndrome, left upper limb: Secondary | ICD-10-CM | POA: Diagnosis not present

## 2021-09-03 DIAGNOSIS — Z8639 Personal history of other endocrine, nutritional and metabolic disease: Secondary | ICD-10-CM

## 2021-09-03 DIAGNOSIS — Z9884 Bariatric surgery status: Secondary | ICD-10-CM

## 2021-09-03 DIAGNOSIS — M170 Bilateral primary osteoarthritis of knee: Secondary | ICD-10-CM

## 2021-09-03 DIAGNOSIS — I10 Essential (primary) hypertension: Secondary | ICD-10-CM

## 2021-09-03 DIAGNOSIS — M7522 Bicipital tendinitis, left shoulder: Secondary | ICD-10-CM | POA: Diagnosis not present

## 2021-09-03 NOTE — Patient Instructions (Signed)
Cervical Strain and Sprain Rehab ?Ask your health care provider which exercises are safe for you. Do exercises exactly as told by your health care provider and adjust them as directed. It is normal to feel mild stretching, pulling, tightness, or discomfort as you do these exercises. Stop right away if you feel sudden pain or your pain gets worse. Do not begin these exercises until told by your health care provider. ?Stretching and range-of-motion exercises ?Cervical side bending ? ?Using good posture, sit on a stable chair or stand up. ?Without moving your shoulders, slowly tilt your left / right ear to your shoulder until you feel a stretch in the opposite side neck muscles. You should be looking straight ahead. ?Hold for __________ seconds. ?Repeat with the other side of your neck. ?Repeat __________ times. Complete this exercise __________ times a day. ?Cervical rotation ? ?Using good posture, sit on a stable chair or stand up. ?Slowly turn your head to the side as if you are looking over your left / right shoulder. ?Keep your eyes level with the ground. ?Stop when you feel a stretch along the side and the back of your neck. ?Hold for __________ seconds. ?Repeat this by turning to your other side. ?Repeat __________ times. Complete this exercise __________ times a day. ?Thoracic extension and pectoral stretch ? ?Roll a towel or a small blanket so it is about 4 inches (10 cm) in diameter. ?Lie down on your back on a firm surface. ?Put the towel in the middle of your back across your spine. It should not be under your shoulder blades. ?Put your hands behind your head and let your elbows fall out to your sides. ?Hold for __________ seconds. ?Repeat __________ times. Complete this exercise __________ times a day. ?Strengthening exercises ?Isometric upper cervical flexion ? ?Lie on your back with a thin pillow behind your head and a small rolled-up towel under your neck. ?Gently tuck your chin toward your chest and  nod your head down to look toward your feet. Do not lift your head off the pillow. ?Hold for __________ seconds. ?Release the tension slowly. Relax your neck muscles completely before you repeat this exercise. ?Repeat __________ times. Complete this exercise __________ times a day. ?Isometric cervical extension ? ?Stand about 6 inches (15 cm) away from a wall, with your back facing the wall. ?Place a soft object, about 6-8 inches (15-20 cm) in diameter, between the back of your head and the wall. A soft object could be a small pillow, a ball, or a folded towel. ?Gently tilt your head back and press into the soft object. Keep your jaw and forehead relaxed. ?Hold for __________ seconds. ?Release the tension slowly. Relax your neck muscles completely before you repeat this exercise. ?Repeat __________ times. Complete this exercise __________ times a day. ?Posture and body mechanics ?Body mechanics refers to the movements and positions of your body while you do your daily activities. Posture is part of body mechanics. Good posture and healthy body mechanics can help to relieve stress in your body's tissues and joints. Good posture means that your spine is in its natural S-curve position (your spine is neutral), your shoulders are pulled back slightly, and your head is not tipped forward. The following are general guidelines for applying improved posture and body mechanics to your everyday activities. ?Sitting ? ?When sitting, keep your spine neutral and keep your feet flat on the floor. Use a footrest, if necessary, and keep your thighs parallel to the floor. Avoid rounding  your shoulders, and avoid tilting your head forward. ?When working at a desk or a computer, keep your desk at a height where your hands are slightly lower than your elbows. Slide your chair under your desk so you are close enough to maintain good posture. ?When working at a computer, place your monitor at a height where you are looking straight ahead  and you do not have to tilt your head forward or downward to look at the screen. ?Standing ? ?When standing, keep your spine neutral and keep your feet about hip-width apart. Keep a slight bend in your knees. Your ears, shoulders, and hips should line up. ?When you do a task in which you stand in one place for a long time, place one foot up on a stable object that is 2-4 inches (5-10 cm) high, such as a footstool. This helps keep your spine neutral. ?Resting ?When lying down and resting, avoid positions that are most painful for you. Try to support your neck in a neutral position. You can use a contour pillow or a small rolled-up towel. Your pillow should support your neck but not push on it. ?This information is not intended to replace advice given to you by your health care provider. Make sure you discuss any questions you have with your health care provider. ?Document Revised: 03/03/2021 Document Reviewed: 03/03/2021 ?Elsevier Patient Education ? [place]. ?Hand Exercises ?Hand exercises can be helpful for almost anyone. These exercises can strengthen the hands, improve flexibility and movement, and increase blood flow to the hands. These results can make work and daily tasks easier. ?Hand exercises can be especially helpful for people who have joint pain from arthritis or have nerve damage from overuse (carpal tunnel syndrome). ?These exercises can also help people who have injured a hand. ?Exercises ?Most of these hand exercises are gentle stretching and motion exercises. It is usually safe to do them often throughout the day. Warming up your hands before exercise may help to reduce stiffness. You can do this with gentle massage or by placing your hands in warm water for 10-15 minutes. ?It is normal to feel some stretching, pulling, tightness, or mild discomfort as you begin new exercises. This will gradually improve. Stop an exercise right away if you feel sudden, severe pain or your pain gets worse.  Ask your health care provider which exercises are best for you. ?Knuckle bend or "claw" fist ? ?Stand or sit with your arm, hand, and all five fingers pointed straight up. Make sure to keep your wrist straight during the exercise. ?Gently bend your fingers down toward your palm until the tips of your fingers are touching the top of your palm. Keep your big knuckle straight and just bend the small knuckles in your fingers. ?Hold this position for __________ seconds. ?Straighten (extend) your fingers back to the starting position. ?Repeat this exercise 5-10 times with each hand. ?Full finger fist ? ?Stand or sit with your arm, hand, and all five fingers pointed straight up. Make sure to keep your wrist straight during the exercise. ?Gently bend your fingers into your palm until the tips of your fingers are touching the middle of your palm. ?Hold this position for __________ seconds. ?Extend your fingers back to the starting position, stretching every joint fully. ?Repeat this exercise 5-10 times with each hand. ?Straight fist ?Stand or sit with your arm, hand, and all five fingers pointed straight up. Make sure to keep your wrist straight during the exercise. ?Gently bend  your fingers at the big knuckle, where your fingers meet your hand, and the middle knuckle. Keep the knuckle at the tips of your fingers straight and try to touch the bottom of your palm. ?Hold this position for __________ seconds. ?Extend your fingers back to the starting position, stretching every joint fully. ?Repeat this exercise 5-10 times with each hand. ?Tabletop ? ?Stand or sit with your arm, hand, and all five fingers pointed straight up. Make sure to keep your wrist straight during the exercise. ?Gently bend your fingers at the big knuckle, where your fingers meet your hand, as far down as you can while keeping the small knuckles in your fingers straight. Think of forming a tabletop with your fingers. ?Hold this position for __________  seconds. ?Extend your fingers back to the starting position, stretching every joint fully. ?Repeat this exercise 5-10 times with each hand. ?Finger spread ? ?Place your hand flat on a table with your palm

## 2021-09-16 LAB — HM DIABETES EYE EXAM

## 2021-09-23 ENCOUNTER — Encounter: Payer: Self-pay | Admitting: Internal Medicine

## 2021-09-30 ENCOUNTER — Other Ambulatory Visit: Payer: Self-pay | Admitting: Internal Medicine

## 2021-11-05 ENCOUNTER — Other Ambulatory Visit: Payer: Self-pay

## 2021-11-05 MED ORDER — MOUNJARO 10 MG/0.5ML ~~LOC~~ SOAJ: 10.0000 mg | SUBCUTANEOUS | 0 refills | Status: DC

## 2021-11-19 ENCOUNTER — Other Ambulatory Visit: Payer: Self-pay | Admitting: Internal Medicine

## 2021-12-15 ENCOUNTER — Ambulatory Visit (INDEPENDENT_AMBULATORY_CARE_PROVIDER_SITE_OTHER): Payer: BC Managed Care – PPO | Admitting: Internal Medicine

## 2021-12-15 ENCOUNTER — Encounter: Payer: Self-pay | Admitting: Internal Medicine

## 2021-12-15 VITALS — BP 126/82 | HR 74 | Temp 98.0°F | Ht 62.0 in | Wt 312.6 lb

## 2021-12-15 DIAGNOSIS — Z0000 Encounter for general adult medical examination without abnormal findings: Secondary | ICD-10-CM

## 2021-12-15 DIAGNOSIS — I10 Essential (primary) hypertension: Secondary | ICD-10-CM

## 2021-12-15 DIAGNOSIS — L304 Erythema intertrigo: Secondary | ICD-10-CM | POA: Diagnosis not present

## 2021-12-15 DIAGNOSIS — E1165 Type 2 diabetes mellitus with hyperglycemia: Secondary | ICD-10-CM | POA: Diagnosis not present

## 2021-12-15 DIAGNOSIS — Z6841 Body Mass Index (BMI) 40.0 and over, adult: Secondary | ICD-10-CM

## 2021-12-15 LAB — POCT URINALYSIS DIPSTICK
Bilirubin, UA: NEGATIVE
Blood, UA: NEGATIVE
Glucose, UA: NEGATIVE
Ketones, UA: NEGATIVE
Leukocytes, UA: NEGATIVE
Nitrite, UA: NEGATIVE
Protein, UA: NEGATIVE
Spec Grav, UA: 1.01 (ref 1.010–1.025)
Urobilinogen, UA: 0.2 E.U./dL
pH, UA: 5.5 (ref 5.0–8.0)

## 2021-12-15 MED ORDER — MOUNJARO 15 MG/0.5ML ~~LOC~~ SOAJ: 15.0000 mg | SUBCUTANEOUS | 3 refills | Status: DC

## 2021-12-15 NOTE — Progress Notes (Signed)
Crystal Escobar,acting as a Education administrator for Crystal Greenland, MD.,have documented all relevant documentation on the behalf of Crystal Greenland, MD,as directed by  Crystal Greenland, MD while in the presence of Crystal Greenland, MD.   Subjective:     Patient ID: Crystal Escobar , female    DOB: 04/20/1971 , 51 y.o.   MRN: [id number]   Chief Complaint  Patient presents with   Annual Exam   Diabetes   Hypertension    HPI  Patient presents today for HM, Patient states compliance with meds. Patient doesn't have any other issues today. She reports compliance with meds. She denies headaches, chest pain and shortness of breath.   BP Readings from Last 3 Encounters: 12/15/21 : 126/82 09/03/21 : 108/78 08/20/21 : 126/82    Diabetes She presents for her follow-up diabetic visit. She has type 2 diabetes mellitus. There are no hypoglycemic associated symptoms. There are no diabetic associated symptoms. Pertinent negatives for diabetes include no blurred vision and no chest pain. There are no hypoglycemic complications. Risk factors for coronary artery disease include diabetes mellitus, dyslipidemia, hypertension, obesity and sedentary lifestyle. When asked about current treatments, none were reported. She is compliant with treatment some of the time. She is following a generally healthy diet. She participates in exercise intermittently. An ACE inhibitor/angiotensin II receptor blocker is being taken.  Hypertension This is a chronic problem. The current episode started more than 1 year ago. The problem has been gradually improving since onset. The problem is uncontrolled. Pertinent negatives include no blurred vision, chest pain, palpitations or shortness of breath.     Past Medical History:  Diagnosis Date   Abnormal Pap smear 2011   Diabetes mellitus    DUB (dysfunctional uterine bleeding) 12/22/2012   HLD (hyperlipidemia)    Hypertension    Sleep apnea    does not use machine as prescribed      Family History  Problem Relation Age of Onset   Heart attack Mother    Hypertension Mother    Diabetes Mother    Seizures Father    Hypertension Sister    Sickle cell anemia Maternal Aunt    Hypertension Maternal Aunt    Lung cancer Maternal Grandmother    Heart murmur Son    Colon cancer Neg Hx      Current Outpatient Medications:    CALCIUM PO, Take by mouth daily., Disp: , Rfl:    cetirizine (ZYRTEC) 10 MG tablet, Take 1 tablet (10 mg total) by mouth daily as needed for allergies., Disp: 30 tablet, Rfl: 4   Cholecalciferol (VITAMIN D3) 125 MCG (5000 UT) CAPS, Take 5,000 Units by mouth daily., Disp: , Rfl:    losartan (COZAAR) 50 MG tablet, Take 1 tablet by mouth once daily, Disp: 90 tablet, Rfl: 0   Multiple Vitamin (MULTIVITAMIN) capsule, Take 1 capsule by mouth daily., Disp: , Rfl:    Probiotic Product (PROBIOTIC PO), Take 1 capsule by mouth daily., Disp: , Rfl:    atorvastatin (LIPITOR) 20 MG tablet, Take 1 tablet (20 mg total) by mouth daily., Disp: 90 tablet, Rfl: 1   nystatin powder, Apply 1 Application topically 3 (three) times daily. prn, Disp: 60 g, Rfl: 0   tirzepatide (MOUNJARO) 15 MG/0.5ML Pen, Inject 15 mg into the skin once a week., Disp: 2 mL, Rfl: 3   No Known Allergies    The patient states she uses tubal ligation for birth control. Last LMP was Patient's last menstrual period  was 11/05/2021 (approximate).. Negative for Dysmenorrhea. Negative for: breast discharge, breast lump(s), breast pain and breast self exam. Associated symptoms include abnormal vaginal bleeding. Pertinent negatives include abnormal bleeding (hematology), anxiety, decreased libido, depression, difficulty falling sleep, dyspareunia, history of infertility, nocturia, sexual dysfunction, sleep disturbances, urinary incontinence, urinary urgency, vaginal discharge and vaginal itching. Diet regular.The patient states her exercise level is  moderate, gradually increasing.   . The patient's tobacco  use is:  Social History   Tobacco Use  Smoking Status Former   Packs/day: 1.00   Years: 10.00   Total pack years: 10.00   Types: Cigars, Cigarettes   Quit date: 06/03/2018   Years since quitting: 3.5   Passive exposure: Never  Smokeless Tobacco Never  . She has been exposed to passive smoke. The patient's alcohol use is:  Social History   Substance and Sexual Activity  Alcohol Use Yes   Comment: occasionally     Review of Systems  Constitutional: Negative.   HENT: Negative.    Eyes: Negative.  Negative for blurred vision.  Respiratory: Negative.  Negative for shortness of breath.   Cardiovascular: Negative.  Negative for chest pain and palpitations.  Gastrointestinal: Negative.   Endocrine: Negative.   Genitourinary: Negative.   Musculoskeletal: Negative.   Skin: Negative.   Allergic/Immunologic: Negative.   Neurological: Negative.   Hematological: Negative.   Psychiatric/Behavioral: Negative.       Today's Vitals   12/15/21 1510  BP: 126/82  Pulse: 74  Temp: 98 F (36.7 C)  TempSrc: Oral  SpO2: 98%  Weight: (!) 312 lb 9.6 oz (141.8 kg)  Height: $Remove'5\' 2"'fWlVwWl$  (1.575 m)  PainSc: 0-No pain   Body mass index is 57.18 kg/m.  Wt Readings from Last 3 Encounters:  12/15/21 (!) 312 lb 9.6 oz (141.8 kg)  09/03/21 (!) 322 lb 6.4 oz (146.2 kg)  08/20/21 (!) 317 lb 12.8 oz (144.2 kg)    Objective:  Physical Exam Vitals and nursing note reviewed.  Constitutional:      Appearance: Normal appearance. She is obese.  HENT:     Head: Normocephalic and atraumatic.     Right Ear: Tympanic membrane, ear canal and external ear normal.     Left Ear: Tympanic membrane, ear canal and external ear normal.     Nose: Nose normal.     Mouth/Throat:     Mouth: Mucous membranes are moist.     Pharynx: Oropharynx is clear.  Eyes:     Extraocular Movements: Extraocular movements intact.     Conjunctiva/sclera: Conjunctivae normal.     Pupils: Pupils are equal, round, and reactive to  light.  Cardiovascular:     Rate and Rhythm: Normal rate and regular rhythm.     Pulses: Normal pulses.          Dorsalis pedis pulses are 2+ on the right side and 2+ on the left side.     Heart sounds: Normal heart sounds.  Pulmonary:     Effort: Pulmonary effort is normal.     Breath sounds: Normal breath sounds.  Chest:  Breasts:    Tanner Score is 5.     Right: Normal.     Left: Normal.  Abdominal:     General: Bowel sounds are normal.     Palpations: Abdomen is soft.     Comments: Obese, soft. Difficult to assess organomegaly due to body habitus  Genitourinary:    Comments: deferred Musculoskeletal:        General: Normal range  of motion.     Cervical back: Normal range of motion and neck supple.     Left foot: Left foot bunion: go.  Feet:     Right foot:     Protective Sensation: 5 sites tested.  5 sites sensed.     Skin integrity: Skin integrity normal.     Toenail Condition: Right toenails are normal.     Left foot:     Protective Sensation: 5 sites tested.  5 sites sensed.     Skin integrity: Skin integrity normal.     Toenail Condition: Left toenails are normal.  Skin:    General: Skin is warm and dry.     Comments: Intertrigo in abdominal area  Neurological:     General: No focal deficit present.     Mental Status: She is alert and oriented to person, place, and time.  Psychiatric:        Mood and Affect: Mood normal.        Behavior: Behavior normal.      Assessment And Plan:     1. Annual physical exam Comments: A full exam was performed. Importance of monthly self breast exams was discussed with the patient. PATIENT IS ADVISED TO GET 30-45 MINUTES REGULAR EXERCISE NO LESS THAN FOUR TO FIVE DAYS PER WEEK - BOTH WEIGHTBEARING EXERCISES AND AEROBIC ARE RECOMMENDED.  PATIENT IS ADVISED TO FOLLOW A HEALTHY DIET WITH AT LEAST SIX FRUITS/VEGGIES PER DAY, DECREASE INTAKE OF RED MEAT, AND TO INCREASE FISH INTAKE TO TWO DAYS PER WEEK.  MEATS/FISH SHOULD NOT BE  FRIED, BAKED OR BROILED IS PREFERABLE.  IT IS ALSO IMPORTANT TO CUT BACK ON YOUR SUGAR INTAKE. PLEASE AVOID ANYTHING WITH ADDED SUGAR, CORN SYRUP OR OTHER SWEETENERS. IF YOU MUST USE A SWEETENER, YOU CAN TRY STEVIA. IT IS ALSO IMPORTANT TO AVOID ARTIFICIALLY SWEETENERS AND DIET BEVERAGES. LASTLY, I SUGGEST WEARING SPF 50 SUNSCREEN ON EXPOSED PARTS AND ESPECIALLY WHEN IN THE DIRECT SUNLIGHT FOR AN EXTENDED PERIOD OF TIME.  PLEASE AVOID FAST FOOD RESTAURANTS AND INCREASE YOUR WATER INTAKE. - EKG 12-Lead - CBC no Diff - CMP14+EGFR - Hemoglobin A1c - Lipid panel  2. Uncontrolled type 2 diabetes mellitus with hyperglycemia ([place]) Comments: Diabetic foot exam was performed.  I will send rx Mounjaro $RemoveBef'15mg'AtPjtXwBpr$  weekly. She agrees to f/u in 3 months. I DISCUSSED WITH THE PATIENT AT LENGTH REGARDING THE GOALS OF GLYCEMIC CONTROL AND POSSIBLE LONG-TERM COMPLICATIONS.  I  ALSO STRESSED THE IMPORTANCE OF COMPLIANCE WITH HOME GLUCOSE MONITORING, DIETARY RESTRICTIONS INCLUDING AVOIDANCE OF SUGARY DRINKS/PROCESSED FOODS,  ALONG WITH REGULAR EXERCISE.  I  ALSO STRESSED THE IMPORTANCE OF ANNUAL EYE EXAMS, SELF FOOT CARE AND COMPLIANCE WITH OFFICE VISITS.  - Microalbumin / Creatinine Urine Ratio - POCT Urinalysis Dipstick (81002) - tirzepatide (MOUNJARO) 15 MG/0.5ML Pen; Inject 15 mg into the skin once a week.  Dispense: 2 mL; Refill: 3  3. HYPERTENSION, BENIGN ESSENTIAL Comments: Chronic, well controlled. EKG performed, NSR w/ negative precordial T waves. She is encouraged to follow low sodium diet. She will rto in 4-6 months for re-evaluation.  She will c/w losartan $RemoveBefo'50mg'aGAXyrkHCgP$  daily.  - EKG 12-Lead  4. Intertrigo Comments: I will send rx nystatin powder to affected area bid prn.   5. Morbid obesity with BMI of 50.0-59.9, adult [facility]) Comments: She was congratulated on her 10lb weight loss since May 2023, and encouraged to keep up the great work!   Patient was given opportunity to ask questions. Patient verbalized  understanding of the  plan and was able to repeat key elements of the plan. All questions were answered to their satisfaction.   I, Crystal Greenland, MD, have reviewed all documentation for this visit. The documentation on 12/15/21 for the exam, diagnosis, procedures, and orders are all accurate and complete.   THE PATIENT IS ENCOURAGED TO PRACTICE SOCIAL DISTANCING DUE TO THE COVID-19 PANDEMIC.

## 2021-12-15 NOTE — Patient Instructions (Signed)

## 2021-12-16 ENCOUNTER — Encounter: Payer: Self-pay | Admitting: Internal Medicine

## 2021-12-16 LAB — CMP14+EGFR
ALT: 13 IU/L (ref 0–32)
AST: 16 IU/L (ref 0–40)
Albumin/Globulin Ratio: 1.2 (ref 1.2–2.2)
Albumin: 4 g/dL (ref 3.9–4.9)
Alkaline Phosphatase: 123 IU/L — ABNORMAL HIGH (ref 44–121)
BUN/Creatinine Ratio: 9 (ref 9–23)
BUN: 6 mg/dL (ref 6–24)
Bilirubin Total: 0.3 mg/dL (ref 0.0–1.2)
CO2: 20 mmol/L (ref 20–29)
Calcium: 9.6 mg/dL (ref 8.7–10.2)
Chloride: 104 mmol/L (ref 96–106)
Creatinine, Ser: 0.69 mg/dL (ref 0.57–1.00)
Globulin, Total: 3.3 g/dL (ref 1.5–4.5)
Glucose: 79 mg/dL (ref 70–99)
Potassium: 4.3 mmol/L (ref 3.5–5.2)
Sodium: 138 mmol/L (ref 134–144)
Total Protein: 7.3 g/dL (ref 6.0–8.5)
eGFR: 107 mL/min/{1.73_m2} (ref >59)

## 2021-12-16 LAB — HEMOGLOBIN A1C
Est. average glucose Bld gHb Est-mCnc: 117 mg/dL
Hgb A1c MFr Bld: 5.7 % — ABNORMAL HIGH (ref 4.8–5.6)

## 2021-12-16 LAB — LIPID PANEL
Chol/HDL Ratio: 3.9 ratio (ref 0.0–4.4)
Cholesterol, Total: 216 mg/dL — ABNORMAL HIGH (ref 100–199)
HDL: 56 mg/dL (ref >39)
LDL Chol Calc (NIH): 145 mg/dL — ABNORMAL HIGH (ref 0–99)
Triglycerides: 86 mg/dL (ref 0–149)
VLDL Cholesterol Cal: 15 mg/dL (ref 5–40)

## 2021-12-16 LAB — MICROALBUMIN / CREATININE URINE RATIO
Creatinine, Urine: 32.1 mg/dL
Microalb/Creat Ratio: 251 mg/g creat — ABNORMAL HIGH (ref 0–29)
Microalbumin, Urine: 80.6 ug/mL

## 2021-12-16 LAB — CBC
Hematocrit: 42.1 % (ref 34.0–46.6)
Hemoglobin: 13.5 g/dL (ref 11.1–15.9)
MCH: 28.3 pg (ref 26.6–33.0)
MCHC: 32.1 g/dL (ref 31.5–35.7)
MCV: 88 fL (ref 79–97)
Platelets: 345 10*3/uL (ref 150–450)
RBC: 4.77 x10E6/uL (ref 3.77–5.28)
RDW: 14 % (ref 11.7–15.4)
WBC: 6.3 10*3/uL (ref 3.4–10.8)

## 2021-12-17 ENCOUNTER — Other Ambulatory Visit: Payer: Self-pay

## 2021-12-17 ENCOUNTER — Encounter: Payer: Self-pay | Admitting: Internal Medicine

## 2021-12-17 MED ORDER — ATORVASTATIN CALCIUM 20 MG PO TABS: 20.0000 mg | ORAL_TABLET | Freq: Every day | ORAL | 1 refills | Status: DC

## 2021-12-21 MED ORDER — NYSTATIN 100000 UNIT/GM EX POWD: 1.0000 | Freq: Three times a day (TID) | CUTANEOUS | 0 refills | Status: DC

## 2021-12-30 ENCOUNTER — Telehealth: Payer: Self-pay

## 2021-12-30 NOTE — Telephone Encounter (Signed)
Patient notified that her forms have been completed and faxed. I have placed a copy in the mail for her as requested. YL,RMA

## 2022-01-01 ENCOUNTER — Encounter: Payer: Self-pay | Admitting: Internal Medicine

## 2022-01-01 ENCOUNTER — Other Ambulatory Visit: Payer: Self-pay

## 2022-01-01 MED ORDER — LOSARTAN POTASSIUM 50 MG PO TABS: 50.0000 mg | ORAL_TABLET | Freq: Every day | ORAL | 1 refills | Status: DC

## 2022-01-01 MED ORDER — VALSARTAN 80 MG PO TABS
80.0000 mg | ORAL_TABLET | Freq: Every day | ORAL | 1 refills | Status: DC
Start: 2022-01-01 — End: 2022-05-11

## 2022-02-12 ENCOUNTER — Other Ambulatory Visit: Payer: Self-pay

## 2022-02-12 DIAGNOSIS — E1165 Type 2 diabetes mellitus with hyperglycemia: Secondary | ICD-10-CM

## 2022-02-12 MED ORDER — MOUNJARO 15 MG/0.5ML ~~LOC~~ SOAJ: 15.0000 mg | SUBCUTANEOUS | 3 refills | Status: DC

## 2022-03-11 ENCOUNTER — Ambulatory Visit (INDEPENDENT_AMBULATORY_CARE_PROVIDER_SITE_OTHER): Payer: BC Managed Care – PPO | Admitting: Internal Medicine

## 2022-03-11 ENCOUNTER — Encounter: Payer: Self-pay | Admitting: Internal Medicine

## 2022-03-11 VITALS — BP 124/80 | HR 76 | Temp 98.1°F | Ht 63.6 in | Wt 307.2 lb

## 2022-03-11 DIAGNOSIS — Z23 Encounter for immunization: Secondary | ICD-10-CM | POA: Diagnosis not present

## 2022-03-11 DIAGNOSIS — E785 Hyperlipidemia, unspecified: Secondary | ICD-10-CM | POA: Insufficient documentation

## 2022-03-11 DIAGNOSIS — E1169 Type 2 diabetes mellitus with other specified complication: Secondary | ICD-10-CM | POA: Diagnosis not present

## 2022-03-11 DIAGNOSIS — I10 Essential (primary) hypertension: Secondary | ICD-10-CM

## 2022-03-11 DIAGNOSIS — Z6841 Body Mass Index (BMI) 40.0 and over, adult: Secondary | ICD-10-CM

## 2022-03-11 NOTE — Progress Notes (Signed)
Crystal Escobar,acting as a Education administrator for Crystal Greenland, MD.,have documented all relevant documentation on the behalf of Crystal Greenland, MD,as directed by  Crystal Greenland, MD while in the presence of Crystal Greenland, MD.    Subjective:     Patient ID: Crystal Escobar , female    DOB: 12-19-1970 , 51 y.o.   MRN: [id number]   Chief Complaint  Patient presents with   Diabetes   Hypertension    HPI  Patient is here today for DM/HTN f/u. She reports compliance with meds. She denies headaches, chest pain and shortness of breath.  She is now exercising 4 days per week. She rides her bicycle MTTh and Fridays. She feels good and has noticed an improvement in her endurance.   Diabetes She presents for her follow-up diabetic visit. She has type 2 diabetes mellitus. There are no hypoglycemic associated symptoms. There are no diabetic associated symptoms. Pertinent negatives for diabetes include no blurred vision. There are no hypoglycemic complications. Risk factors for coronary artery disease include diabetes mellitus, dyslipidemia, hypertension, obesity and sedentary lifestyle. When asked about current treatments, none were reported. She is compliant with treatment some of the time. She is following a generally healthy diet. She participates in exercise intermittently. An ACE inhibitor/angiotensin II receptor blocker is being taken.  Hypertension This is a chronic problem. The current episode started more than 1 year ago. The problem has been gradually improving since onset. The problem is uncontrolled. Pertinent negatives include no blurred vision. Risk factors for coronary artery disease include obesity, sedentary lifestyle and diabetes mellitus. Past treatments include angiotensin blockers. The current treatment provides moderate improvement.     Past Medical History:  Diagnosis Date   Abnormal Pap smear 2011   Diabetes mellitus    DUB (dysfunctional uterine bleeding) 12/22/2012   HLD  (hyperlipidemia)    Hypertension    Sleep apnea    does not use machine as prescribed     Family History  Problem Relation Age of Onset   Heart attack Mother    Hypertension Mother    Diabetes Mother    Seizures Father    Hypertension Sister    Sickle cell anemia Maternal Aunt    Hypertension Maternal Aunt    Lung cancer Maternal Grandmother    Heart murmur Son    Colon cancer Neg Hx      Current Outpatient Medications:    atorvastatin (LIPITOR) 20 MG tablet, Take 1 tablet (20 mg total) by mouth daily., Disp: 90 tablet, Rfl: 1   CALCIUM PO, Take by mouth daily., Disp: , Rfl:    cetirizine (ZYRTEC) 10 MG tablet, Take 1 tablet (10 mg total) by mouth daily as needed for allergies., Disp: 30 tablet, Rfl: 4   Cholecalciferol (VITAMIN D3) 125 MCG (5000 UT) CAPS, Take 5,000 Units by mouth daily., Disp: , Rfl:    Multiple Vitamin (MULTIVITAMIN) capsule, Take 1 capsule by mouth daily., Disp: , Rfl:    Probiotic Product (PROBIOTIC PO), Take 1 capsule by mouth daily., Disp: , Rfl:    tirzepatide (MOUNJARO) 15 MG/0.5ML Pen, Inject 15 mg into the skin once a week., Disp: 2 mL, Rfl: 3   valsartan (DIOVAN) 80 MG tablet, Take 1 tablet (80 mg total) by mouth daily., Disp: 90 tablet, Rfl: 1   No Known Allergies   Review of Systems  Constitutional: Negative.   HENT: Negative.    Eyes: Negative.  Negative for blurred vision.  Respiratory: Negative.  Cardiovascular: Negative.   Gastrointestinal: Negative.      Today's Vitals   03/11/22 1040  BP: 124/80  Pulse: 76  Temp: 98.1 F (36.7 C)  Weight: (!) 307 lb 3.2 oz (139.3 kg)  Height: 5' 3.6" (1.615 m)  PainSc: 0-No pain   Body mass index is 53.4 kg/m.  Wt Readings from Last 3 Encounters:  03/11/22 (!) 307 lb 3.2 oz (139.3 kg)  12/15/21 (!) 312 lb 9.6 oz (141.8 kg)  09/03/21 (!) 322 lb 6.4 oz (146.2 kg)     Objective:  Physical Exam Vitals and nursing note reviewed.  Constitutional:      Appearance: Normal appearance. She  is obese.  HENT:     Head: Normocephalic and atraumatic.     Nose:     Comments: Masked     Mouth/Throat:     Comments: Masked  Eyes:     Extraocular Movements: Extraocular movements intact.  Cardiovascular:     Rate and Rhythm: Normal rate and regular rhythm.     Heart sounds: Normal heart sounds.  Pulmonary:     Effort: Pulmonary effort is normal.     Breath sounds: Normal breath sounds.  Musculoskeletal:     Cervical back: Normal range of motion.  Skin:    General: Skin is warm.  Neurological:     General: No focal deficit present.     Mental Status: She is alert.  Psychiatric:        Mood and Affect: Mood normal.        Behavior: Behavior normal.       Assessment And Plan:     1. Dyslipidemia due to type 2 diabetes mellitus ([place]) Comments: Chronic, LDL goal <70. She will c/w  Mounjaro 37m weekly. She is now on valsartan for renal protection. - BMP8+EGFR - Hemoglobin A1c  2. HYPERTENSION, BENIGN ESSENTIAL Comments: Chronic, well controlled. She will c/w valsartan 818mdaily. She is encouraged to limit her sodium intake.  3. Class 3 severe obesity due to excess calories with serious comorbidity and body mass index (BMI) of 50.0 to 59.9 in adult [facility] BMI 53. She is encouraged to incorporate some strength training into her exercise routine. She was congratulated on 15lb weight loss since May 2023.  4. Immunization due - Flu Vaccine QUAD 6+ mos PF IM (Fluarix Quad PF)   Patient was given opportunity to ask questions. Patient verbalized understanding of the plan and was able to repeat key elements of the plan. All questions were answered to their satisfaction.   I, RoMaximino GreenlandMD, have reviewed all documentation for this visit. The documentation on 03/11/22 for the exam, diagnosis, procedures, and orders are all accurate and complete.   IF YOU HAVE BEEN REFERRED TO A SPECIALIST, IT MAY TAKE 1-2 WEEKS TO SCHEDULE/PROCESS THE REFERRAL. IF YOU HAVE NOT HEARD  FROM US/SPECIALIST IN TWO WEEKS, PLEASE GIVE [place] CALL AT [phone].   THE PATIENT IS ENCOURAGED TO PRACTICE SOCIAL DISTANCING DUE TO THE COVID-19 PANDEMIC.

## 2022-03-11 NOTE — Patient Instructions (Signed)

## 2022-03-12 LAB — BMP8+EGFR
BUN/Creatinine Ratio: 7 — ABNORMAL LOW (ref 9–23)
BUN: 5 mg/dL — ABNORMAL LOW (ref 6–24)
CO2: 28 mmol/L (ref 20–29)
Calcium: 9.5 mg/dL (ref 8.7–10.2)
Chloride: 104 mmol/L (ref 96–106)
Creatinine, Ser: 0.68 mg/dL (ref 0.57–1.00)
Glucose: 72 mg/dL (ref 70–99)
Potassium: 4.4 mmol/L (ref 3.5–5.2)
Sodium: 140 mmol/L (ref 134–144)
eGFR: 107 mL/min/{1.73_m2} (ref >59)

## 2022-03-12 LAB — HEMOGLOBIN A1C
Est. average glucose Bld gHb Est-mCnc: 114 mg/dL
Hgb A1c MFr Bld: 5.6 % (ref 4.8–5.6)

## 2022-03-24 ENCOUNTER — Encounter: Payer: Self-pay | Admitting: Internal Medicine

## 2022-04-15 ENCOUNTER — Other Ambulatory Visit: Payer: Self-pay | Admitting: Nurse Practitioner

## 2022-05-09 ENCOUNTER — Other Ambulatory Visit: Payer: Self-pay | Admitting: Internal Medicine

## 2022-06-13 ENCOUNTER — Other Ambulatory Visit: Payer: Self-pay | Admitting: Nurse Practitioner

## 2022-07-15 ENCOUNTER — Encounter: Payer: Self-pay | Admitting: Internal Medicine

## 2022-07-15 ENCOUNTER — Ambulatory Visit: Payer: BC Managed Care – PPO | Admitting: Internal Medicine

## 2022-07-15 VITALS — BP 122/82 | HR 89 | Temp 98.1°F | Ht 63.0 in | Wt 310.2 lb

## 2022-07-15 DIAGNOSIS — E785 Hyperlipidemia, unspecified: Secondary | ICD-10-CM

## 2022-07-15 DIAGNOSIS — I10 Essential (primary) hypertension: Secondary | ICD-10-CM | POA: Diagnosis not present

## 2022-07-15 DIAGNOSIS — E1169 Type 2 diabetes mellitus with other specified complication: Secondary | ICD-10-CM

## 2022-07-15 DIAGNOSIS — Z6841 Body Mass Index (BMI) 40.0 and over, adult: Secondary | ICD-10-CM

## 2022-07-15 MED ORDER — VALSARTAN 80 MG PO TABS: 80.0000 mg | ORAL_TABLET | Freq: Every day | ORAL | 2 refills | Status: DC

## 2022-07-15 MED ORDER — ATORVASTATIN CALCIUM 20 MG PO TABS: 20.0000 mg | ORAL_TABLET | Freq: Every day | ORAL | 2 refills | Status: DC

## 2022-07-15 NOTE — Patient Instructions (Signed)

## 2022-07-15 NOTE — Progress Notes (Signed)
I,Crystal Escobar,acting as a scribe for Crystal Greenland, MD.,have documented all relevant documentation on the behalf of Crystal Greenland, MD,as directed by  Crystal Greenland, MD while in the presence of Crystal Greenland, MD.    Subjective:     Patient ID: Crystal Escobar , female    DOB: 1970-06-02 , 52 y.o.   MRN: [id number]   Chief Complaint  Patient presents with   Diabetes   Hypertension    HPI  Patient is here today for DM/HTN f/u. She reports compliance with meds. She denies headaches, chest pain and shortness of breath.  She is now exercising 4 days per week. She also reports riding her bike. She has no specific questions or concerns.    Diabetes She presents for her follow-up diabetic visit. She has type 2 diabetes mellitus. There are no hypoglycemic associated symptoms. There are no diabetic associated symptoms. Pertinent negatives for diabetes include no blurred vision. There are no hypoglycemic complications. Risk factors for coronary artery disease include diabetes mellitus, dyslipidemia, hypertension, obesity and sedentary lifestyle. When asked about current treatments, none were reported. She is compliant with treatment some of the time. She is following a generally healthy diet. She participates in exercise intermittently. An ACE inhibitor/angiotensin II receptor blocker is being taken.  Hypertension This is a chronic problem. The current episode started more than 1 year ago. The problem has been gradually improving since onset. The problem is uncontrolled. Pertinent negatives include no blurred vision. Risk factors for coronary artery disease include obesity, sedentary lifestyle and diabetes mellitus. Past treatments include angiotensin blockers. The current treatment provides moderate improvement.     Past Medical History:  Diagnosis Date   Abnormal Pap smear 2011   Diabetes mellitus    DUB (dysfunctional uterine bleeding) 12/22/2012   HLD (hyperlipidemia)     Hypertension    Sleep apnea    does not use machine as prescribed     Family History  Problem Relation Age of Onset   Heart attack Mother    Hypertension Mother    Diabetes Mother    Seizures Father    Hypertension Sister    Sickle cell anemia Maternal Aunt    Hypertension Maternal Aunt    Lung cancer Maternal Grandmother    Heart murmur Son    Colon cancer Neg Hx      Current Outpatient Medications:    CALCIUM PO, Take by mouth daily., Disp: , Rfl:    cetirizine (ZYRTEC) 10 MG tablet, Take 1 tablet (10 mg total) by mouth daily as needed for allergies., Disp: 30 tablet, Rfl: 4   Cholecalciferol (VITAMIN D3) 125 MCG (5000 UT) CAPS, Take 5,000 Units by mouth daily., Disp: , Rfl:    Multiple Vitamin (MULTIVITAMIN) capsule, Take 1 capsule by mouth daily., Disp: , Rfl:    Probiotic Product (PROBIOTIC PO), Take 1 capsule by mouth daily., Disp: , Rfl:    tirzepatide (MOUNJARO) 15 MG/0.5ML Pen, Inject 15 mg into the skin once a week., Disp: 2 mL, Rfl: 3   atorvastatin (LIPITOR) 20 MG tablet, Take 1 tablet (20 mg total) by mouth daily., Disp: 90 tablet, Rfl: 2   valsartan (DIOVAN) 80 MG tablet, Take 1 tablet (80 mg total) by mouth daily., Disp: 90 tablet, Rfl: 2   No Known Allergies   Review of Systems  Constitutional: Negative.   Eyes:  Negative for blurred vision.  Respiratory: Negative.    Cardiovascular: Negative.   Gastrointestinal: Negative.   Musculoskeletal:  Negative.   Skin: Negative.   Neurological: Negative.   Psychiatric/Behavioral: Negative.       Today's Vitals   07/15/22 1101  BP: 122/82  Pulse: 89  Temp: 98.1 F (36.7 C)  SpO2: 98%  Weight: (!) 310 lb 3.2 oz (140.7 kg)  Height: 5\' 3"  (1.6 m)   Body mass index is 54.95 kg/m.  Wt Readings from Last 3 Encounters:  07/15/22 (!) 310 lb 3.2 oz (140.7 kg)  03/11/22 (!) 307 lb 3.2 oz (139.3 kg)  12/15/21 (!) 312 lb 9.6 oz (141.8 kg)    Objective:  Physical Exam Vitals and nursing note reviewed.   Constitutional:      Appearance: Normal appearance. She is obese.  HENT:     Head: Normocephalic and atraumatic.     Nose:     Comments: Masked     Mouth/Throat:     Comments: Masked  Eyes:     Extraocular Movements: Extraocular movements intact.  Cardiovascular:     Rate and Rhythm: Normal rate and regular rhythm.     Heart sounds: Normal heart sounds.  Pulmonary:     Effort: Pulmonary effort is normal.     Breath sounds: Normal breath sounds.  Musculoskeletal:     Cervical back: Normal range of motion.  Skin:    General: Skin is warm.  Neurological:     General: No focal deficit present.     Mental Status: She is alert.  Psychiatric:        Mood and Affect: Mood normal.        Behavior: Behavior normal.     Assessment And Plan:     1. Dyslipidemia due to type 2 diabetes mellitus ([place]) Comments: Chronic, LDL goal < 20. She will c/w atorvastatin 20mg  daily and Mounjaro 15mg  weekly. She agrees to f/u in 3-4 months for re-evaluation. - CMP14+EGFR - Hemoglobin A1c - Lipid panel  2. HYPERTENSION, BENIGN ESSENTIAL Comments: Chronic, well controlled. She will c/w valsartan 80mg  daily. She is encouraged to follow low sodium diet. - CMP14+EGFR - Lipid panel  3. Class 3 severe obesity due to excess calories with serious comorbidity and body mass index (BMI) of 50.0 to 59.9 in adult [facility]) Comments: BMI 54. She has gained 3lbs since Nov 2023.   She is encouraged to aim for at least 150 minutes of exercise/week, while striving for BMI<45.    Patient was given opportunity to ask questions. Patient verbalized understanding of the plan and was able to repeat key elements of the plan. All questions were answered to their satisfaction.   I, Crystal Greenland, MD, have reviewed all documentation for this visit. The documentation on 07/15/22 for the exam, diagnosis, procedures, and orders are all accurate and complete.   IF YOU HAVE BEEN REFERRED TO A SPECIALIST, IT MAY TAKE 1-2 WEEKS  TO SCHEDULE/PROCESS THE REFERRAL. IF YOU HAVE NOT HEARD FROM US/SPECIALIST IN TWO WEEKS, PLEASE GIVE [place] A CALL AT [phone].   THE PATIENT IS ENCOURAGED TO PRACTICE SOCIAL DISTANCING DUE TO THE COVID-19 PANDEMIC.

## 2022-07-16 LAB — CMP14+EGFR
ALT: 14 IU/L (ref 0–32)
AST: 18 IU/L (ref 0–40)
Albumin/Globulin Ratio: 1 — ABNORMAL LOW (ref 1.2–2.2)
Albumin: 3.7 g/dL — ABNORMAL LOW (ref 3.9–4.9)
Alkaline Phosphatase: 111 IU/L (ref 44–121)
BUN/Creatinine Ratio: 8 — ABNORMAL LOW (ref 9–23)
BUN: 6 mg/dL (ref 6–24)
Bilirubin Total: 0.4 mg/dL (ref 0.0–1.2)
CO2: 23 mmol/L (ref 20–29)
Calcium: 9.5 mg/dL (ref 8.7–10.2)
Chloride: 106 mmol/L (ref 96–106)
Creatinine, Ser: 0.71 mg/dL (ref 0.57–1.00)
Globulin, Total: 3.6 g/dL (ref 1.5–4.5)
Glucose: 82 mg/dL (ref 70–99)
Potassium: 4.6 mmol/L (ref 3.5–5.2)
Sodium: 141 mmol/L (ref 134–144)
Total Protein: 7.3 g/dL (ref 6.0–8.5)
eGFR: 104 mL/min/{1.73_m2} (ref >59)

## 2022-07-16 LAB — LIPID PANEL
Chol/HDL Ratio: 4 ratio (ref 0.0–4.4)
Cholesterol, Total: 209 mg/dL — ABNORMAL HIGH (ref 100–199)
HDL: 52 mg/dL (ref >39)
LDL Chol Calc (NIH): 140 mg/dL — ABNORMAL HIGH (ref 0–99)
Triglycerides: 94 mg/dL (ref 0–149)
VLDL Cholesterol Cal: 17 mg/dL (ref 5–40)

## 2022-07-16 LAB — HEMOGLOBIN A1C
Est. average glucose Bld gHb Est-mCnc: 117 mg/dL
Hgb A1c MFr Bld: 5.7 % — ABNORMAL HIGH (ref 4.8–5.6)

## 2022-08-04 ENCOUNTER — Encounter: Payer: Self-pay | Admitting: Internal Medicine

## 2022-08-05 ENCOUNTER — Other Ambulatory Visit: Payer: Self-pay

## 2022-08-05 MED ORDER — SEMAGLUTIDE(0.25 OR 0.5MG/DOS) 2 MG/3ML ~~LOC~~ SOPN: PEN_INJECTOR | SUBCUTANEOUS | 0 refills | Status: DC

## 2022-08-10 ENCOUNTER — Telehealth: Payer: Self-pay

## 2022-08-10 NOTE — Telephone Encounter (Signed)
PA for Ozempic sent to plan  

## 2022-08-27 ENCOUNTER — Other Ambulatory Visit: Payer: Self-pay | Admitting: Internal Medicine

## 2022-08-27 DIAGNOSIS — E1165 Type 2 diabetes mellitus with hyperglycemia: Secondary | ICD-10-CM

## 2022-08-27 MED ORDER — MOUNJARO 15 MG/0.5ML ~~LOC~~ SOAJ: 15.0000 mg | SUBCUTANEOUS | 3 refills | Status: DC

## 2022-09-04 ENCOUNTER — Telehealth: Payer: Self-pay

## 2022-09-04 NOTE — Telephone Encounter (Signed)
PA for Ozempic sent to plan. Waiting for determination.

## 2022-09-23 LAB — HM DIABETES EYE EXAM

## 2022-10-21 ENCOUNTER — Ambulatory Visit: Payer: BC Managed Care – PPO | Admitting: Internal Medicine

## 2022-10-21 ENCOUNTER — Encounter: Payer: Self-pay | Admitting: Internal Medicine

## 2022-10-21 VITALS — BP 112/80 | HR 74 | Temp 98.4°F | Ht 63.0 in | Wt 315.0 lb

## 2022-10-21 DIAGNOSIS — E1169 Type 2 diabetes mellitus with other specified complication: Secondary | ICD-10-CM | POA: Diagnosis not present

## 2022-10-21 DIAGNOSIS — I10 Essential (primary) hypertension: Secondary | ICD-10-CM | POA: Diagnosis not present

## 2022-10-21 DIAGNOSIS — N951 Menopausal and female climacteric states: Secondary | ICD-10-CM | POA: Diagnosis not present

## 2022-10-21 DIAGNOSIS — E66813 Obesity, class 3: Secondary | ICD-10-CM

## 2022-10-21 DIAGNOSIS — E785 Hyperlipidemia, unspecified: Secondary | ICD-10-CM | POA: Diagnosis not present

## 2022-10-21 DIAGNOSIS — Z6841 Body Mass Index (BMI) 40.0 and over, adult: Secondary | ICD-10-CM

## 2022-10-21 MED ORDER — SEMAGLUTIDE (1 MG/DOSE) 4 MG/3ML ~~LOC~~ SOPN: 1.0000 mg | PEN_INJECTOR | SUBCUTANEOUS | 3 refills | Status: DC

## 2022-10-21 NOTE — Assessment & Plan Note (Signed)
She is experiencing mood swings, sleep issues and increased irritability. She may benefit from SSRI therapy. She plans to try a vitamin suggested by her sister. I will discuss other options further if needed. Following a clean diet free of processed foods should also help to alleviate her symptoms.  Will reassess at next visit.

## 2022-10-21 NOTE — Patient Instructions (Signed)

## 2022-10-21 NOTE — Progress Notes (Signed)
I,[name], CMA,acting as a Neurosurgeon for [name], MD.,have documented all relevant documentation on the behalf of [name], MD,as directed by  [name], MD while in the presence of [name], MD.  Subjective:  Patient ID: Crystal Escobar , female    DOB: [birth year] , 52 y.o.   MRN: [id number]  Chief Complaint  Patient presents with   Hypertension   Diabetes    HPI  Patient is here today for DM/HTN f/u. She reports compliance with meds. She denies headaches, chest pain and shortness of breath.   Letter sent to [facility] eye care for dm exam.  Diabetes She presents for her follow-up diabetic visit. She has type 2 diabetes mellitus. There are no hypoglycemic associated symptoms. There are no diabetic associated symptoms. Pertinent negatives for diabetes include no blurred vision. There are no hypoglycemic complications. Risk factors for coronary artery disease include diabetes mellitus, dyslipidemia, hypertension, obesity and sedentary lifestyle. When asked about current treatments, none were reported. She is compliant with treatment some of the time. She is following a generally healthy diet. She participates in exercise intermittently. An ACE inhibitor/angiotensin II receptor blocker is being taken.  Hypertension This is a chronic problem. The current episode started more than 1 year ago. The problem has been gradually improving since onset. The problem is uncontrolled. Pertinent negatives include no blurred vision. Risk factors for coronary artery disease include obesity, sedentary lifestyle and diabetes mellitus. Past treatments include angiotensin blockers. The current treatment provides moderate improvement.     Past Medical History:  Diagnosis Date   Abnormal Pap smear 2011   Diabetes mellitus    DUB (dysfunctional uterine bleeding) 12/22/2012   HLD (hyperlipidemia)    Hypertension    Sleep apnea    does not use machine as prescribed     Family  History  Problem Relation Age of Onset   Heart attack Mother    Hypertension Mother    Diabetes Mother    Seizures Father    Hypertension Sister    Sickle cell anemia Maternal Aunt    Hypertension Maternal Aunt    Lung cancer Maternal Grandmother    Heart murmur Son    Colon cancer Neg Hx      Current Outpatient Medications:    atorvastatin (LIPITOR) 20 MG tablet, Take 1 tablet (20 mg total) by mouth daily., Disp: 90 tablet, Rfl: 2   CALCIUM PO, Take by mouth daily., Disp: , Rfl:    cetirizine (ZYRTEC) 10 MG tablet, Take 1 tablet (10 mg total) by mouth daily as needed for allergies., Disp: 30 tablet, Rfl: 4   Cholecalciferol (VITAMIN D3) 125 MCG (5000 UT) CAPS, Take 5,000 Units by mouth daily., Disp: , Rfl:    Multiple Vitamin (MULTIVITAMIN) capsule, Take 1 capsule by mouth daily., Disp: , Rfl:    Probiotic Product (PROBIOTIC PO), Take 1 capsule by mouth daily., Disp: , Rfl:    Semaglutide, 1 MG/DOSE, 4 MG/3ML SOPN, Inject 1 mg into the skin once a week., Disp: 3 mL, Rfl: 3   valsartan (DIOVAN) 80 MG tablet, Take 1 tablet (80 mg total) by mouth daily., Disp: 90 tablet, Rfl: 2   No Known Allergies   Review of Systems  Constitutional: Negative.   Eyes:  Negative for blurred vision.  Respiratory: Negative.    Cardiovascular: Negative.   Gastrointestinal: Negative.   Neurological: Negative.   Psychiatric/Behavioral: Negative.       Today's Vitals   10/21/22 1443  BP: 112/80  Pulse: 74  Temp: 98.4 F (36.9 C)  Weight: (!) 315 lb (142.9 kg)  Height: 5\' 3"  (1.6 m)  PainSc: 0-No pain   Body mass index is 55.8 kg/m.  Wt Readings from Last 3 Encounters:  10/21/22 (!) 315 lb (142.9 kg)  07/15/22 (!) 310 lb 3.2 oz (140.7 kg)  03/11/22 (!) 307 lb 3.2 oz (139.3 kg)     Objective:  Physical Exam Vitals and nursing note reviewed.  Constitutional:      Appearance: Normal appearance. She is obese.  HENT:     Head: Normocephalic and atraumatic.  Eyes:     Extraocular  Movements: Extraocular movements intact.  Cardiovascular:     Rate and Rhythm: Normal rate and regular rhythm.     Heart sounds: Normal heart sounds.  Pulmonary:     Effort: Pulmonary effort is normal.     Breath sounds: Normal breath sounds.  Skin:    General: Skin is warm.  Neurological:     General: No focal deficit present.     Mental Status: She is alert.  Psychiatric:        Mood and Affect: Mood normal.        Behavior: Behavior normal.         Assessment And Plan:  HYPERTENSION, BENIGN ESSENTIAL Assessment & Plan: Chronic, controlled.  She will c/w valsartan 80mg  daily. Advised to follow low sodium diet. I will check renal function today. She will rto in Sept 2024 for her next physical examination.    Dyslipidemia due to type 2 diabetes mellitus (HCC) Assessment & Plan: Chronic, LDL goal < 70.  She will continue with atorvastatin 20mg  daily. Advised to follow a heart healthy lifestyle. Her insurance is no longer covering Mounjaro. Wants to switch back on Ozempic. She was given sample of 0.5mg  to use weekly x 4. I will send rx Ozempic 1mg  weekly to her pharmacy. She will f/u in 3months.   Orders: -     Hemoglobin A1c -     BMP8+EGFR  Perimenopause Assessment & Plan: She is experiencing mood swings, sleep issues and increased irritability. She may benefit from SSRI therapy. She plans to try a vitamin suggested by her sister. I will discuss other options further if needed. Following a clean diet free of processed foods should also help to alleviate her symptoms.  Will reassess at next visit.    Class 3 severe obesity due to excess calories with serious comorbidity and body mass index (BMI) of 50.0 to 59.9 in adult [facility])  Other orders -     Semaglutide (1 MG/DOSE); Inject 1 mg into the skin once a week.  Dispense: 3 mL; Refill: 3     Return if symptoms worsen or fail to improve, for keep next appt as scheduled .  Patient was given opportunity to ask questions.  Patient verbalized understanding of the plan and was able to repeat key elements of the plan. All questions were answered to their satisfaction.   I, [name], MD, have reviewed all documentation for this visit. The documentation on 10/21/22 for the exam, diagnosis, procedures, and orders are all accurate and complete.  IF YOU HAVE BEEN REFERRED TO A SPECIALIST, IT MAY TAKE 1-2 WEEKS TO SCHEDULE/PROCESS THE REFERRAL. IF YOU HAVE NOT HEARD FROM US/SPECIALIST IN TWO WEEKS, PLEASE GIVE [place] A CALL AT [phone].   THE PATIENT IS ENCOURAGED TO PRACTICE SOCIAL DISTANCING DUE TO THE COVID-19 PANDEMIC.

## 2022-10-21 NOTE — Assessment & Plan Note (Signed)
Chronic, controlled.  She will c/w valsartan 80mg  daily. Advised to follow low sodium diet. I will check renal function today. She will rto in Sept 2024 for her next physical examination.

## 2022-10-21 NOTE — Assessment & Plan Note (Signed)
Chronic, LDL goal < 70.  She will continue with atorvastatin 20mg  daily. Advised to follow a heart healthy lifestyle. Her insurance is no longer covering Mounjaro. Wants to switch back on Ozempic. She was given sample of 0.5mg  to use weekly x 4. I will send rx Ozempic 1mg  weekly to her pharmacy. She will f/u in 3months.

## 2022-10-23 ENCOUNTER — Telehealth: Payer: Self-pay

## 2022-10-23 LAB — BMP8+EGFR
BUN/Creatinine Ratio: 10 (ref 9–23)
BUN: 7 mg/dL (ref 6–24)
CO2: 20 mmol/L (ref 20–29)
Calcium: 9.5 mg/dL (ref 8.7–10.2)
Chloride: 103 mmol/L (ref 96–106)
Creatinine, Ser: 0.67 mg/dL (ref 0.57–1.00)
Glucose: 77 mg/dL (ref 70–99)
Potassium: 4.9 mmol/L (ref 3.5–5.2)
Sodium: 141 mmol/L (ref 134–144)
eGFR: 107 mL/min/{1.73_m2} (ref >59)

## 2022-10-23 LAB — HEMOGLOBIN A1C
Est. average glucose Bld gHb Est-mCnc: 126 mg/dL
Hgb A1c MFr Bld: 6 % — ABNORMAL HIGH (ref 4.8–5.6)

## 2022-10-23 NOTE — Telephone Encounter (Signed)
PA sent to plan. Waiting for insurance to send questions back to send off for determination.

## 2022-10-23 NOTE — Telephone Encounter (Signed)
PA sent to plan, waiting for determination.  

## 2022-12-02 ENCOUNTER — Telehealth: Payer: Self-pay

## 2022-12-02 NOTE — Telephone Encounter (Signed)
Rx verified YL,rMA

## 2022-12-21 ENCOUNTER — Encounter: Payer: BC Managed Care – PPO | Admitting: Internal Medicine

## 2022-12-22 ENCOUNTER — Encounter: Payer: Self-pay | Admitting: Internal Medicine

## 2022-12-29 ENCOUNTER — Encounter: Payer: Self-pay | Admitting: Internal Medicine

## 2023-01-06 ENCOUNTER — Ambulatory Visit (INDEPENDENT_AMBULATORY_CARE_PROVIDER_SITE_OTHER): Payer: BC Managed Care – PPO | Admitting: Internal Medicine

## 2023-01-06 ENCOUNTER — Encounter: Payer: Self-pay | Admitting: Internal Medicine

## 2023-01-06 VITALS — BP 118/80 | HR 84 | Temp 98.2°F | Ht 63.0 in | Wt 316.2 lb

## 2023-01-06 DIAGNOSIS — N951 Menopausal and female climacteric states: Secondary | ICD-10-CM | POA: Diagnosis not present

## 2023-01-06 DIAGNOSIS — Z6841 Body Mass Index (BMI) 40.0 and over, adult: Secondary | ICD-10-CM

## 2023-01-06 DIAGNOSIS — Z0000 Encounter for general adult medical examination without abnormal findings: Secondary | ICD-10-CM

## 2023-01-06 DIAGNOSIS — I10 Essential (primary) hypertension: Secondary | ICD-10-CM

## 2023-01-06 DIAGNOSIS — E1169 Type 2 diabetes mellitus with other specified complication: Secondary | ICD-10-CM | POA: Diagnosis not present

## 2023-01-06 DIAGNOSIS — E785 Hyperlipidemia, unspecified: Secondary | ICD-10-CM | POA: Diagnosis not present

## 2023-01-06 DIAGNOSIS — Z23 Encounter for immunization: Secondary | ICD-10-CM | POA: Diagnosis not present

## 2023-01-06 DIAGNOSIS — E66813 Obesity, class 3: Secondary | ICD-10-CM

## 2023-01-06 LAB — POCT URINALYSIS DIPSTICK
Bilirubin, UA: NEGATIVE
Blood, UA: NEGATIVE
Glucose, UA: NEGATIVE
Ketones, UA: NEGATIVE
Nitrite, UA: NEGATIVE
Protein, UA: POSITIVE — AB
Spec Grav, UA: 1.025 (ref 1.010–1.025)
Urobilinogen, UA: 0.2 U/dL
pH, UA: 5.5 (ref 5.0–8.0)

## 2023-01-06 MED ORDER — VENLAFAXINE HCL ER 37.5 MG PO CP24
37.5000 mg | ORAL_CAPSULE | Freq: Every day | ORAL | 2 refills | Status: DC
Start: 2023-01-06 — End: 2023-03-31

## 2023-01-06 MED ORDER — TRULICITY 0.75 MG/0.5ML ~~LOC~~ SOAJ
0.7500 mg | SUBCUTANEOUS | 0 refills | Status: DC
Start: 2023-01-06 — End: 2023-02-17

## 2023-01-06 NOTE — Progress Notes (Signed)
I,Crystal Escobar, CMA,acting as a Neurosurgeon for Crystal Aliment, MD.,have documented all relevant documentation on the behalf of Crystal Aliment, MD,as directed by  Crystal Aliment, MD while in the presence of Crystal Aliment, MD.  Subjective:    Patient ID: Crystal Escobar , female    DOB: April 24, 1971 , 52 y.o.   MRN: [id number]  Chief Complaint  Patient presents with   Annual Exam   Hypertension   Diabetes    HPI  Patient presents today for HM, she is followed by Dr. Waynard Reeds for her GYN care.  Patient states compliance with meds. Patient doesn't have any other issues today. She reports compliance with meds. She denies headaches, chest pain and shortness of breath.   She reports having a recent eye exam. Will request records.     Diabetes She presents for her follow-up diabetic visit. She has type 2 diabetes mellitus. There are no hypoglycemic associated symptoms. There are no diabetic associated symptoms. Pertinent negatives for diabetes include no blurred vision and no chest pain. There are no hypoglycemic complications. Risk factors for coronary artery disease include diabetes mellitus, dyslipidemia, hypertension, obesity and sedentary lifestyle. When asked about current treatments, none were reported. She is compliant with treatment some of the time. She is following a generally healthy diet. She participates in exercise intermittently. An ACE inhibitor/angiotensin II receptor blocker is being taken.  Hypertension This is a chronic problem. The current episode started more than 1 year ago. The problem has been gradually improving since onset. The problem is uncontrolled. Pertinent negatives include no blurred vision, chest pain, palpitations or shortness of breath.     Past Medical History:  Diagnosis Date   Abnormal Pap smear 2011   Diabetes mellitus    DUB (dysfunctional uterine bleeding) 12/22/2012   HLD (hyperlipidemia)    Hypertension    Sleep apnea    does not use  machine as prescribed     Family History  Problem Relation Age of Onset   Heart attack Mother    Hypertension Mother    Diabetes Mother    Seizures Father    Hypertension Sister    Sickle cell anemia Maternal Aunt    Hypertension Maternal Aunt    Lung cancer Maternal Grandmother    Heart murmur Son    Colon cancer Neg Hx      Current Outpatient Medications:    atorvastatin (LIPITOR) 20 MG tablet, Take 1 tablet (20 mg total) by mouth daily., Disp: 90 tablet, Rfl: 2   CALCIUM PO, Take by mouth daily., Disp: , Rfl:    cetirizine (ZYRTEC) 10 MG tablet, Take 1 tablet (10 mg total) by mouth daily as needed for allergies., Disp: 30 tablet, Rfl: 4   Cholecalciferol (VITAMIN D3) 125 MCG (5000 UT) CAPS, Take 5,000 Units by mouth daily., Disp: , Rfl:    Dulaglutide (TRULICITY) 0.75 MG/0.5ML SOPN, Inject 0.75 mg into the skin once a week., Disp: 4 mL, Rfl: 0   Multiple Vitamin (MULTIVITAMIN) capsule, Take 1 capsule by mouth daily., Disp: , Rfl:    Probiotic Product (PROBIOTIC PO), Take 1 capsule by mouth daily., Disp: , Rfl:    valsartan (DIOVAN) 80 MG tablet, Take 1 tablet (80 mg total) by mouth daily., Disp: 90 tablet, Rfl: 2   venlafaxine XR (EFFEXOR XR) 37.5 MG 24 hr capsule, Take 1 capsule (37.5 mg total) by mouth daily., Disp: 30 capsule, Rfl: 2   No Known Allergies    The patient  states she uses OCP (estrogen/progesterone) for birth control. No LMP recorded (lmp unknown). (Menstrual status: Irregular Periods).. Negative for Dysmenorrhea. Negative for: breast discharge, breast lump(s), breast pain and breast self exam. Associated symptoms include abnormal vaginal bleeding. Pertinent negatives include abnormal bleeding (hematology), anxiety, decreased libido, depression, difficulty falling sleep, dyspareunia, history of infertility, nocturia, sexual dysfunction, sleep disturbances, urinary incontinence, urinary urgency, vaginal discharge and vaginal itching. Diet regular.The patient states  her exercise level is  moderate - 3 days weekly.   . The patient's tobacco use is:  Social History   Tobacco Use  Smoking Status Former   Current packs/day: 0.00   Average packs/day: 1 pack/day for 10.0 years (10.0 ttl pk-yrs)   Types: Cigars, Cigarettes   Start date: 06/03/2008   Quit date: 06/03/2018   Years since quitting: 4.6   Passive exposure: Never  Smokeless Tobacco Never  . She has been exposed to passive smoke. The patient's alcohol use is:  Social History   Substance and Sexual Activity  Alcohol Use Yes   Comment: occasionally    Review of Systems  Constitutional: Negative.   HENT: Negative.    Eyes: Negative.  Negative for blurred vision.  Respiratory: Negative.  Negative for shortness of breath.   Cardiovascular: Negative.  Negative for chest pain and palpitations.  Gastrointestinal: Negative.   Genitourinary:        Hot flashes  Musculoskeletal: Negative.   Neurological: Negative.   Psychiatric/Behavioral: Negative.       Today's Vitals   01/06/23 1407  BP: 118/80  Pulse: 84  Temp: 98.2 F (36.8 C)  SpO2: 98%  Weight: (!) 316 lb 3.2 oz (143.4 kg)  Height: 5\' 3"  (1.6 m)   Body mass index is 56.01 kg/m.  Wt Readings from Last 3 Encounters:  01/06/23 (!) 316 lb 3.2 oz (143.4 kg)  10/21/22 (!) 315 lb (142.9 kg)  07/15/22 (!) 310 lb 3.2 oz (140.7 kg)    BP Readings from Last 3 Encounters:  01/06/23 118/80  10/21/22 112/80  07/15/22 122/82     Objective:  Physical Exam Vitals and nursing note reviewed.  Constitutional:      Appearance: Normal appearance. She is obese.  HENT:     Head: Normocephalic and atraumatic.     Right Ear: Tympanic membrane, ear canal and external ear normal.     Left Ear: Tympanic membrane, ear canal and external ear normal.     Nose: Nose normal.     Mouth/Throat:     Mouth: Mucous membranes are moist.     Pharynx: Oropharynx is clear.  Eyes:     Extraocular Movements: Extraocular movements intact.      Conjunctiva/sclera: Conjunctivae normal.     Pupils: Pupils are equal, round, and reactive to light.  Cardiovascular:     Rate and Rhythm: Normal rate and regular rhythm.     Pulses: Normal pulses.     Heart sounds: Normal heart sounds.  Pulmonary:     Effort: Pulmonary effort is normal.     Breath sounds: Normal breath sounds.  Chest:  Breasts:    Tanner Score is 5.     Right: Normal.     Left: Normal.  Abdominal:     General: Bowel sounds are normal.     Palpations: Abdomen is soft.     Comments: Obese, soft. Difficult to assess organomegaly due to body habitus  Genitourinary:    Comments: deferred Musculoskeletal:        General: Normal range  of motion.     Cervical back: Normal range of motion and neck supple.  Skin:    General: Skin is warm and dry.  Neurological:     General: No focal deficit present.     Mental Status: She is alert and oriented to person, place, and time.  Psychiatric:        Mood and Affect: Mood normal.        Behavior: Behavior normal.         Assessment And Plan:     Annual physical exam Assessment & Plan: A full exam was performed.  Importance of monthly self breast exams was discussed with the patient.  She is advised to get 30-45 minutes of regular exercise, no less than four to five days per week. Both weight-bearing and aerobic exercises are recommended.  She is advised to follow a healthy diet with at least six fruits/veggies per day, decrease intake of red meat and other saturated fats and to increase fish intake to twice weekly.  Meats/fish should not be fried -- baked, boiled or broiled is preferable. It is also important to cut back on your sugar intake.  Be sure to read labels - try to avoid anything with added sugar, high fructose corn syrup or other sweeteners.  If you must use a sweetener, you can try stevia or monkfruit.  It is also important to avoid artificially sweetened foods/beverages and diet drinks. Lastly, wear SPF 50 sunscreen  on exposed skin and when in direct sunlight for an extended period of time.  Be sure to avoid fast food restaurants and aim for at least 60 ounces of water daily.      Orders: -     CBC -     CMP14+EGFR -     Lipid panel -     Hemoglobin A1c -     TSH  HYPERTENSION, BENIGN ESSENTIAL Assessment & Plan: Chronic, controlled. EKG performed, NSR w/o acute changes. She will c/w valsartan 80mg  daily. Advised to follow low sodium diet.   Orders: -     POCT urinalysis dipstick -     Microalbumin / creatinine urine ratio -     EKG 12-Lead  Dyslipidemia due to type 2 diabetes mellitus (HCC) Assessment & Plan: Chronic, LDL goal is less than 70. She is on statin therapy. Diabetic foot exam was performed. I DISCUSSED WITH THE PATIENT AT LENGTH REGARDING THE GOALS OF GLYCEMIC CONTROL AND POSSIBLE LONG-TERM COMPLICATIONS.  I  ALSO STRESSED THE IMPORTANCE OF COMPLIANCE WITH HOME GLUCOSE MONITORING, DIETARY RESTRICTIONS INCLUDING AVOIDANCE OF SUGARY DRINKS/PROCESSED FOODS,  ALONG WITH REGULAR EXERCISE.  I  ALSO STRESSED THE IMPORTANCE OF ANNUAL EYE EXAMS, SELF FOOT CARE AND COMPLIANCE WITH OFFICE VISITS.   Orders: -     Trulicity; Inject 0.75 mg into the skin once a week.  Dispense: 4 mL; Refill: 0  Perimenopause Assessment & Plan: Sx have been persistent.  Will start meds as below. Possible side effects d/w patient. She agrees to f/u in six weeks for re-evaluation.   Orders: -     Venlafaxine HCl ER; Take 1 capsule (37.5 mg total) by mouth daily.  Dispense: 30 capsule; Refill: 2  Class 3 severe obesity due to excess calories with serious comorbidity and body mass index (BMI) of 50.0 to 59.9 in adult [facility]) Assessment & Plan: She has gained six pounds since March 2024.  She is encouraged to incorporate more exercise into her daily routine, aiming for at least 150 minutes  of exercise per week.    Immunization due -     Flu vaccine trivalent PF, 6mos and  older(Flulaval,Afluria,Fluarix,Fluzone)  She is encouraged to strive for BMI less than 30 to decrease cardiac risk. Advised to aim for at least 150 minutes of exercise per week.    Return in about 6 weeks (around 02/17/2023), or med check - virtual, for 1 year physical, 4 month DM. Patient was given opportunity to ask questions. Patient verbalized understanding of the plan and was able to repeat key elements of the plan. All questions were answered to their satisfaction.     I, Crystal Aliment, MD, have reviewed all documentation for this visit. The documentation on 01/06/23 for the exam, diagnosis, procedures, and orders are all accurate and complete.

## 2023-01-06 NOTE — Patient Instructions (Signed)

## 2023-01-07 ENCOUNTER — Encounter: Payer: Self-pay | Admitting: Internal Medicine

## 2023-01-07 LAB — CMP14+EGFR
ALT: 15 IU/L (ref 0–32)
AST: 18 IU/L (ref 0–40)
Albumin: 3.9 g/dL (ref 3.9–4.9)
Alkaline Phosphatase: 119 IU/L (ref 44–121)
BUN/Creatinine Ratio: 11 (ref 9–23)
BUN: 7 mg/dL (ref 6–24)
Bilirubin Total: 0.3 mg/dL (ref 0.0–1.2)
CO2: 23 mmol/L (ref 20–29)
Calcium: 9.5 mg/dL (ref 8.7–10.2)
Chloride: 106 mmol/L (ref 96–106)
Creatinine, Ser: 0.65 mg/dL (ref 0.57–1.00)
Globulin, Total: 3.3 g/dL (ref 1.5–4.5)
Glucose: 81 mg/dL (ref 70–99)
Potassium: 4.5 mmol/L (ref 3.5–5.2)
Sodium: 141 mmol/L (ref 134–144)
Total Protein: 7.2 g/dL (ref 6.0–8.5)
eGFR: 108 mL/min/{1.73_m2} (ref >59)

## 2023-01-07 LAB — LIPID PANEL
Chol/HDL Ratio: 2.7 ratio (ref 0.0–4.4)
Cholesterol, Total: 146 mg/dL (ref 100–199)
HDL: 54 mg/dL (ref >39)
LDL Chol Calc (NIH): 73 mg/dL (ref 0–99)
Triglycerides: 102 mg/dL (ref 0–149)
VLDL Cholesterol Cal: 19 mg/dL (ref 5–40)

## 2023-01-07 LAB — CBC
Hematocrit: 41.4 % (ref 34.0–46.6)
Hemoglobin: 13.2 g/dL (ref 11.1–15.9)
MCH: 29.3 pg (ref 26.6–33.0)
MCHC: 31.9 g/dL (ref 31.5–35.7)
MCV: 92 fL (ref 79–97)
Platelets: 317 10*3/uL (ref 150–450)
RBC: 4.5 x10E6/uL (ref 3.77–5.28)
RDW: 13.9 % (ref 11.7–15.4)
WBC: 6.3 10*3/uL (ref 3.4–10.8)

## 2023-01-07 LAB — MICROALBUMIN / CREATININE URINE RATIO
Creatinine, Urine: 129.2 mg/dL
Microalb/Creat Ratio: 148 mg/g{creat} — ABNORMAL HIGH (ref 0–29)
Microalbumin, Urine: 191.8 ug/mL

## 2023-01-07 LAB — TSH: TSH: 1.42 u[IU]/mL (ref 0.450–4.500)

## 2023-01-07 LAB — HEMOGLOBIN A1C
Est. average glucose Bld gHb Est-mCnc: 128 mg/dL
Hgb A1c MFr Bld: 6.1 % — ABNORMAL HIGH (ref 4.8–5.6)

## 2023-01-11 ENCOUNTER — Encounter: Payer: Self-pay | Admitting: Internal Medicine

## 2023-01-12 ENCOUNTER — Telehealth: Payer: Self-pay

## 2023-01-12 NOTE — Telephone Encounter (Signed)
Rx verified YL,RMA

## 2023-01-16 DIAGNOSIS — Z0000 Encounter for general adult medical examination without abnormal findings: Secondary | ICD-10-CM | POA: Insufficient documentation

## 2023-01-16 NOTE — Assessment & Plan Note (Signed)

## 2023-01-16 NOTE — Assessment & Plan Note (Addendum)
Chronic, controlled. EKG performed, NSR w/o acute changes. She will c/w valsartan 80mg  daily. Advised to follow low sodium diet.

## 2023-01-16 NOTE — Assessment & Plan Note (Signed)
Chronic, LDL goal is less than 70. She is on statin therapy. Diabetic foot exam was performed. I DISCUSSED WITH THE PATIENT AT LENGTH REGARDING THE GOALS OF GLYCEMIC CONTROL AND POSSIBLE LONG-TERM COMPLICATIONS.  I  ALSO STRESSED THE IMPORTANCE OF COMPLIANCE WITH HOME GLUCOSE MONITORING, DIETARY RESTRICTIONS INCLUDING AVOIDANCE OF SUGARY DRINKS/PROCESSED FOODS,  ALONG WITH REGULAR EXERCISE.  I  ALSO STRESSED THE IMPORTANCE OF ANNUAL EYE EXAMS, SELF FOOT CARE AND COMPLIANCE WITH OFFICE VISITS.

## 2023-01-16 NOTE — Assessment & Plan Note (Signed)
Sx have been persistent.  Will start meds as below. Possible side effects d/w patient. She agrees to f/u in six weeks for re-evaluation.

## 2023-01-16 NOTE — Assessment & Plan Note (Signed)
She has gained six pounds since March 2024.  She is encouraged to incorporate more exercise into her daily routine, aiming for at least 150 minutes of exercise per week.

## 2023-02-03 ENCOUNTER — Encounter: Payer: Self-pay | Admitting: Internal Medicine

## 2023-02-05 ENCOUNTER — Other Ambulatory Visit: Payer: Self-pay

## 2023-02-05 DIAGNOSIS — E785 Hyperlipidemia, unspecified: Secondary | ICD-10-CM

## 2023-02-05 MED ORDER — DAPAGLIFLOZIN PROPANEDIOL 10 MG PO TABS
10.0000 mg | ORAL_TABLET | Freq: Every day | ORAL | 1 refills | Status: DC
Start: 2023-02-05 — End: 2023-03-31

## 2023-02-10 ENCOUNTER — Other Ambulatory Visit: Payer: BC Managed Care – PPO

## 2023-02-10 DIAGNOSIS — Z79899 Other long term (current) drug therapy: Secondary | ICD-10-CM

## 2023-02-10 NOTE — Progress Notes (Unsigned)
I,Johnathan Heskett T Icesis Renn, CMA,acting as a Neurosurgeon for [employer] documented all relevant documentation on the behalf of TIMA-NURSE,as directed by  [facility] while in the presence of TIMA-NURSE.  Subjective:  Patient ID: Crystal Escobar , female    DOB: [birth year] , 52 y.o.   MRN: [id number]  No chief complaint on file.   HPI  HPI   Past Medical History:  Diagnosis Date   Abnormal Pap smear 2011   Diabetes mellitus    DUB (dysfunctional uterine bleeding) 12/22/2012   HLD (hyperlipidemia)    Hypertension    Sleep apnea    does not use machine as prescribed     Family History  Problem Relation Age of Onset   Heart attack Mother    Hypertension Mother    Diabetes Mother    Seizures Father    Hypertension Sister    Sickle cell anemia Maternal Aunt    Hypertension Maternal Aunt    Lung cancer Maternal Grandmother    Heart murmur Son    Colon cancer Neg Hx      Current Outpatient Medications:    atorvastatin (LIPITOR) 20 MG tablet, Take 1 tablet (20 mg total) by mouth daily., Disp: 90 tablet, Rfl: 2   CALCIUM PO, Take by mouth daily., Disp: , Rfl:    cetirizine (ZYRTEC) 10 MG tablet, Take 1 tablet (10 mg total) by mouth daily as needed for allergies., Disp: 30 tablet, Rfl: 4   Cholecalciferol (VITAMIN D3) 125 MCG (5000 UT) CAPS, Take 5,000 Units by mouth daily., Disp: , Rfl:    dapagliflozin propanediol (FARXIGA) 10 MG TABS tablet, Take 1 tablet (10 mg total) by mouth daily before breakfast., Disp: 30 tablet, Rfl: 1   Dulaglutide (TRULICITY) 0.75 MG/0.5ML SOPN, Inject 0.75 mg into the skin once a week., Disp: 4 mL, Rfl: 0   Multiple Vitamin (MULTIVITAMIN) capsule, Take 1 capsule by mouth daily., Disp: , Rfl:    Probiotic Product (PROBIOTIC PO), Take 1 capsule by mouth daily., Disp: , Rfl:    valsartan (DIOVAN) 80 MG tablet, Take 1 tablet (80 mg total) by mouth daily., Disp: 90 tablet, Rfl: 2   venlafaxine XR (EFFEXOR XR) 37.5 MG 24 hr capsule, Take 1 capsule (37.5 mg total)  by mouth daily., Disp: 30 capsule, Rfl: 2   No Known Allergies   Review of Systems   There were no vitals filed for this visit. There is no height or weight on file to calculate BMI.  Wt Readings from Last 3 Encounters:  01/06/23 (!) 316 lb 3.2 oz (143.4 kg)  10/21/22 (!) 315 lb (142.9 kg)  07/15/22 (!) 310 lb 3.2 oz (140.7 kg)     Objective:  Physical Exam      Assessment And Plan:  There are no diagnoses linked to this encounter.   No follow-ups on file.  Patient was given opportunity to ask questions. Patient verbalized understanding of the plan and was able to repeat key elements of the plan. All questions were answered to their satisfaction.  TIMA-NURSE  I, TIMA-NURSE, have reviewed all documentation for this visit. The documentation on 02/10/23 for the exam, diagnosis, procedures, and orders are all accurate and complete.   IF YOU HAVE BEEN REFERRED TO A SPECIALIST, IT MAY TAKE 1-2 WEEKS TO SCHEDULE/PROCESS THE REFERRAL. IF YOU HAVE NOT HEARD FROM US/SPECIALIST IN TWO WEEKS, PLEASE GIVE [place] A CALL AT [phone] X [phone].   THE PATIENT IS ENCOURAGED TO PRACTICE SOCIAL DISTANCING DUE TO THE COVID-19 PANDEMIC.

## 2023-02-11 LAB — BMP8+EGFR
BUN/Creatinine Ratio: 7 — ABNORMAL LOW (ref 9–23)
BUN: 5 mg/dL — ABNORMAL LOW (ref 6–24)
CO2: 23 mmol/L (ref 20–29)
Calcium: 9.6 mg/dL (ref 8.7–10.2)
Chloride: 107 mmol/L — ABNORMAL HIGH (ref 96–106)
Creatinine, Ser: 0.76 mg/dL (ref 0.57–1.00)
Glucose: 87 mg/dL (ref 70–99)
Potassium: 4.5 mmol/L (ref 3.5–5.2)
Sodium: 144 mmol/L (ref 134–144)
eGFR: 96 mL/min/{1.73_m2} (ref >59)

## 2023-02-15 ENCOUNTER — Other Ambulatory Visit (HOSPITAL_COMMUNITY): Payer: Self-pay

## 2023-02-17 ENCOUNTER — Encounter: Payer: Self-pay | Admitting: Internal Medicine

## 2023-02-17 ENCOUNTER — Telehealth: Payer: BC Managed Care – PPO | Admitting: Internal Medicine

## 2023-02-17 DIAGNOSIS — E785 Hyperlipidemia, unspecified: Secondary | ICD-10-CM

## 2023-02-17 DIAGNOSIS — E1169 Type 2 diabetes mellitus with other specified complication: Secondary | ICD-10-CM | POA: Diagnosis not present

## 2023-02-17 DIAGNOSIS — Z79899 Other long term (current) drug therapy: Secondary | ICD-10-CM | POA: Diagnosis not present

## 2023-02-17 MED ORDER — TRULICITY 1.5 MG/0.5ML ~~LOC~~ SOAJ: 1.5000 mg | SUBCUTANEOUS | 1 refills | Status: DC

## 2023-02-17 NOTE — Assessment & Plan Note (Addendum)
October BMP results reviewed, renal function is stable.  She will continue with [name]. Started on Trulicity at her last visit, her insurance has approved it. I will increase to 1.5mg  weekly. She will rto as previously scheduled for DM check in Jan 2025. She is encouraged to aim for at least 150 minutes of exercise per week.

## 2023-02-17 NOTE — Progress Notes (Signed)
Virtual Visit via Video   This visit type was conducted due to national recommendations for restrictions regarding the COVID-19 Pandemic (e.g. social distancing) in an effort to limit this patient's exposure and mitigate transmission in our community.  Due to her co-morbid illnesses, this patient is at least at moderate risk for complications without adequate follow up.  This format is felt to be most appropriate for this patient at this time.  All issues noted in this document were discussed and addressed.  A limited physical exam was performed with this format.    This visit type was conducted due to national recommendations for restrictions regarding the COVID-19 Pandemic (e.g. social distancing) in an effort to limit this patient's exposure and mitigate transmission in our community.  Patients identity confirmed using two different identifiers.  This format is felt to be most appropriate for this patient at this time.  All issues noted in this document were discussed and addressed.  No physical exam was performed (except for noted visual exam findings with Video Visits).    Date:  02/17/2023   ID:  Crystal Escobar, DOB Aug 15, 1970, MRN [id number]  Patient Location:  In car, passenger seat, parked [place], [place]  Provider location:   Office    Chief Complaint:  "I have Crystal Escobar f/u"  History of Present Illness:    Crystal Escobar is a 52 y.o. female who presents via video conferencing for a telehealth visit today.    The patient does not have symptoms concerning for COVID-19 infection (fever, chills, cough, or new shortness of breath).   Patient presents today for virtual visit. She presents for [name] follow up. She reports compliance with medication. She has not had any issues with the medication. She has already had f/u labs.       Past Medical History:  Diagnosis Date   Abnormal Pap smear 2011   Diabetes mellitus    DUB (dysfunctional uterine bleeding) 12/22/2012   HLD  (hyperlipidemia)    Hypertension    Sleep apnea    does not use machine as prescribed   Past Surgical History:  Procedure Laterality Date   CARPAL TUNNEL RELEASE Right 2016   CESAREAN SECTION     COLONOSCOPY WITH PROPOFOL N/A 09/10/2020   Procedure: COLONOSCOPY WITH PROPOFOL;  Surgeon: Lanelle Bal, DO;  [facility]: [facility];  Service: Endoscopy;  Laterality: N/A;  AM (diabetic)   GASTRIC BYPASS  05/2019   [facility]: Dr. Artist Pais   KNEE SURGERY Left    cartilage   POLYPECTOMY  09/10/2020   Procedure: POLYPECTOMY;  Surgeon: Lanelle Bal, DO;  Location: [facility];  Service: Endoscopy;;   TUBAL LIGATION       Current Meds  Medication Sig   Dulaglutide (TRULICITY) 1.5 MG/0.5ML SOAJ Inject 1.5 mg into the skin once a week.     Allergies:   Patient has no known allergies.   Social History   Tobacco Use   Smoking status: Former    Current packs/day: 0.00    Average packs/day: 1 pack/day for 10.0 years (10.0 ttl pk-yrs)    Types: Cigars, Cigarettes    Start date: 06/03/2008    Quit date: 06/03/2018    Years since quitting: 4.7    Passive exposure: Never   Smokeless tobacco: Never  Vaping Use   Vaping status: Never Used  Substance Use Topics   Alcohol use: Yes    Comment: occasionally    Drug use: No     Family  Hx: The patient's family history includes Diabetes in her mother; Heart attack in her mother; Heart murmur in her son; Hypertension in her maternal aunt, mother, and sister; Lung cancer in her maternal grandmother; Seizures in her father; Sickle cell anemia in her maternal aunt. There is no history of Colon cancer.  ROS:   Please see the history of present illness.    Review of Systems  Constitutional: Negative.   HENT: Negative.    Eyes: Negative.   Respiratory: Negative.    Cardiovascular: Negative.   Psychiatric/Behavioral: Negative.      All other systems reviewed and are negative.   Labs/Other Tests and Data Reviewed:    Recent Labs: 01/06/2023:  ALT 15; Hemoglobin 13.2; Platelets 317; TSH 1.420 02/10/2023: BUN 5; Creatinine, Ser 0.76; Potassium 4.5; Sodium 144   Recent Lipid Panel Lab Results  Component Value Date/Time   CHOL 146 01/06/2023 03:04 PM   TRIG 102 01/06/2023 03:04 PM   HDL 54 01/06/2023 03:04 PM   CHOLHDL 2.7 01/06/2023 03:04 PM   CHOLHDL 4.1 Ratio 06/01/2006 08:54 PM   LDLCALC 73 01/06/2023 03:04 PM    Wt Readings from Last 3 Encounters:  01/06/23 (!) 316 lb 3.2 oz (143.4 kg)  10/21/22 (!) 315 lb (142.9 kg)  07/15/22 (!) 310 lb 3.2 oz (140.7 kg)     Exam:    Vital Signs:  There were no vitals taken for this visit.    Physical Exam Vitals and nursing note reviewed.  Constitutional:      Appearance: Normal appearance. She is obese.  HENT:     Head: Normocephalic and atraumatic.  Eyes:     Extraocular Movements: Extraocular movements intact.  Pulmonary:     Effort: Pulmonary effort is normal.  Musculoskeletal:     Cervical back: Normal range of motion.  Neurological:     Mental Status: She is alert and oriented to person, place, and time.  Psychiatric:        Mood and Affect: Mood and affect normal.        Behavior: Behavior normal.     ASSESSMENT & PLAN:    Dyslipidemia due to type 2 diabetes mellitus [facility]) Assessment & Plan: October BMP results reviewed, renal function is stable.  She will continue with [name]. Started on Trulicity at her last visit, her insurance has approved it. I will increase to 1.5mg  weekly. She will rto as previously scheduled for DM check in Jan 2025. She is encouraged to aim for at least 150 minutes of exercise per week.    Drug therapy  Other orders -     Trulicity; Inject 1.5 mg into the skin once a week.  Dispense: 2 mL; Refill: 1       COVID-19 Education: The signs and symptoms of COVID-19 were discussed with the patient and how to seek care for testing (follow up with PCP or arrange E-visit).  The importance of social distancing was discussed  today.  Patient Risk:   After full review of this patients clinical status, I feel that they are at least moderate risk at this time.  Time:   Today, I have spent 10 minutes/ seconds with the patient with telehealth technology discussing above diagnoses.     Medication Adjustments/Labs and Tests Ordered: Current medicines are reviewed at length with the patient today.  Concerns regarding medicines are outlined above.   Tests Ordered: No orders of the defined types were placed in this encounter.   Medication Changes: Meds ordered this  encounter  Medications   Dulaglutide (TRULICITY) 1.5 MG/0.5ML SOAJ    Sig: Inject 1.5 mg into the skin once a week.    Dispense:  2 mL    Refill:  1    Disposition:  Follow up prn  Signed, [name], MD

## 2023-03-05 NOTE — Progress Notes (Signed)
SCANNED DOCUMENT

## 2023-03-05 NOTE — Progress Notes (Signed)
Scanned document

## 2023-03-31 ENCOUNTER — Other Ambulatory Visit: Payer: Self-pay | Admitting: Internal Medicine

## 2023-03-31 ENCOUNTER — Encounter: Payer: Self-pay | Admitting: Nurse Practitioner

## 2023-03-31 ENCOUNTER — Ambulatory Visit (INDEPENDENT_AMBULATORY_CARE_PROVIDER_SITE_OTHER): Payer: BC Managed Care – PPO | Admitting: Nurse Practitioner

## 2023-03-31 VITALS — BP 116/80 | HR 66 | Temp 98.6°F | Ht 63.0 in | Wt 310.0 lb

## 2023-03-31 DIAGNOSIS — N951 Menopausal and female climacteric states: Secondary | ICD-10-CM

## 2023-03-31 DIAGNOSIS — Z6841 Body Mass Index (BMI) 40.0 and over, adult: Secondary | ICD-10-CM

## 2023-03-31 DIAGNOSIS — E66813 Obesity, class 3: Secondary | ICD-10-CM

## 2023-03-31 DIAGNOSIS — H9313 Tinnitus, bilateral: Secondary | ICD-10-CM

## 2023-03-31 DIAGNOSIS — H9203 Otalgia, bilateral: Secondary | ICD-10-CM

## 2023-03-31 DIAGNOSIS — E1169 Type 2 diabetes mellitus with other specified complication: Secondary | ICD-10-CM

## 2023-03-31 MED ORDER — CETIRIZINE HCL 10 MG PO TABS
10.0000 mg | ORAL_TABLET | Freq: Every day | ORAL | 4 refills | Status: DC | PRN
Start: 2023-03-31 — End: 2023-08-30

## 2023-03-31 NOTE — Progress Notes (Signed)
I,Jameka J Llittleton, CMA,acting as a Neurosurgeon for [employer], FNP.,have documented all relevant documentation on the behalf of Arnette Felts, FNP,as directed by  Arnette Felts, FNP while in the presence of Arnette Felts, FNP.  Subjective:  Patient ID: Crystal Escobar , female    DOB: 1971/03/18 , 52 y.o.   MRN: [id number]  Chief Complaint  Patient presents with   Ear Pain    HPI  Patient presents today for ear pain and ringing her ears. The ringing started last week. She also reports she feels like her ears are clogged.   Otalgia  There is pain in the left ear. This is a new problem. The current episode started in the past 7 days. The problem occurs constantly. The problem has been gradually worsening. There has been no fever. Associated symptoms include coughing and rhinorrhea. Pertinent negatives include no diarrhea, ear discharge or headaches. Associated symptoms comments: Itching ear and tinnitus. Treatments tried: thera flu and nyquil and allergy pill. The treatment provided mild relief. There is no history of a chronic ear infection. history of ear infections     Past Medical History:  Diagnosis Date   Abnormal Pap smear 2011   Diabetes mellitus    DUB (dysfunctional uterine bleeding) 12/22/2012   HLD (hyperlipidemia)    Hypertension    Sleep apnea    does not use machine as prescribed     Family History  Problem Relation Age of Onset   Heart attack Mother    Hypertension Mother    Diabetes Mother    Seizures Father    Hypertension Sister    Sickle cell anemia Maternal Aunt    Hypertension Maternal Aunt    Lung cancer Maternal Grandmother    Heart murmur Son    Colon cancer Neg Hx      Current Outpatient Medications:    atorvastatin (LIPITOR) 20 MG tablet, Take 1 tablet (20 mg total) by mouth daily., Disp: 90 tablet, Rfl: 2   CALCIUM PO, Take by mouth daily., Disp: , Rfl:    Cholecalciferol (VITAMIN D3) 125 MCG (5000 UT) CAPS, Take 5,000 Units by mouth daily., Disp: ,  Rfl:    Multiple Vitamin (MULTIVITAMIN) capsule, Take 1 capsule by mouth daily., Disp: , Rfl:    Probiotic Product (PROBIOTIC PO), Take 1 capsule by mouth daily., Disp: , Rfl:    valsartan (DIOVAN) 80 MG tablet, Take 1 tablet (80 mg total) by mouth daily., Disp: 90 tablet, Rfl: 2   cetirizine (ZYRTEC) 10 MG tablet, Take 1 tablet (10 mg total) by mouth daily as needed for allergies., Disp: 30 tablet, Rfl: 4   FARXIGA 10 MG TABS tablet, TAKE 1 TABLET BY MOUTH ONCE DAILY BEFORE BREAKFAST, Disp: 30 tablet, Rfl: 0   TRULICITY 1.5 MG/0.5ML SOAJ, INJECT 1.5 MG INTO THE SKIN ONCE A WEEK, Disp: 4 mL, Rfl: 0   venlafaxine XR (EFFEXOR-XR) 37.5 MG 24 hr capsule, Take 1 capsule by mouth once daily, Disp: 30 capsule, Rfl: 0   No Known Allergies   Review of Systems  Constitutional: Negative.   HENT:  Positive for ear pain and rhinorrhea. Negative for ear discharge.   Eyes: Negative.   Respiratory:  Positive for cough.   Gastrointestinal:  Negative for diarrhea.  Musculoskeletal: Negative.   Skin: Negative.   Neurological:  Negative for headaches.  Psychiatric/Behavioral: Negative.       Today's Vitals   03/31/23 1027  BP: 116/80  Pulse: 66  Temp: 98.6 F (37 C)  Weight: (!) 310 lb (140.6 kg)  Height: 5\' 3"  (1.6 m)  PainSc: 3   PainLoc: Ear   Body mass index is 54.91 kg/m.  Wt Readings from Last 3 Encounters:  03/31/23 (!) 310 lb (140.6 kg)  01/06/23 (!) 316 lb 3.2 oz (143.4 kg)  10/21/22 (!) 315 lb (142.9 kg)    The 10-year ASCVD risk score (Arnett DK, et al., 2019) is: 4.2%   Values used to calculate the score:     Age: 52 years     Sex: Female     Is Non-Hispanic African American: Yes     Diabetic: Yes     Tobacco smoker: No     Systolic Blood Pressure: 116 mmHg     Is BP treated: Yes     HDL Cholesterol: 54 mg/dL     Total Cholesterol: 146 mg/dL  Objective:  Physical Exam Vitals reviewed.  Constitutional:      General: She is not in acute distress.    Appearance: Normal  appearance. She is well-developed. She is obese.  HENT:     Head: Normocephalic and atraumatic.     Right Ear: Ear canal and external ear normal. There is no impacted cerumen. Tympanic membrane is bulging.     Left Ear: Ear canal and external ear normal. There is no impacted cerumen. Tympanic membrane is bulging.  Eyes:     Pupils: Pupils are equal, round, and reactive to light.  Cardiovascular:     Rate and Rhythm: Normal rate and regular rhythm.     Pulses: Normal pulses.     Heart sounds: Normal heart sounds. No murmur heard. Pulmonary:     Effort: Pulmonary effort is normal.     Breath sounds: Normal breath sounds.  Musculoskeletal:        General: Normal range of motion.  Skin:    General: Skin is warm and dry.     Capillary Refill: Capillary refill takes less than 2 seconds.  Neurological:     General: No focal deficit present.     Mental Status: She is alert and oriented to person, place, and time.     Cranial Nerves: No cranial nerve deficit.  Psychiatric:        Mood and Affect: Mood normal.         Assessment And Plan:  Otalgia of both ears Assessment & Plan: She has bulging bilateral ears, given samples of zyrtec if not better return call to office  Orders: -     Cetirizine HCl; Take 1 tablet (10 mg total) by mouth daily as needed for allergies.  Dispense: 30 tablet; Refill: 4  Tinnitus of both ears Assessment & Plan: Likely related to bulging TMs   BMI 50.0-59.9, adult [facility]) Assessment & Plan: She is encouraged to strive for BMI less than 30 to decrease cardiac risk. Advised to aim for at least 150 minutes of exercise per week.      Return if symptoms worsen or fail to improve.  Patient was given opportunity to ask questions. Patient verbalized understanding of the plan and was able to repeat key elements of the plan. All questions were answered to their satisfaction.    Jeanell Sparrow, FNP, have reviewed all documentation for this visit. The  documentation on 03/31/23 for the exam, diagnosis, procedures, and orders are all accurate and complete.   IF YOU HAVE BEEN REFERRED TO A SPECIALIST, IT MAY TAKE 1-2 WEEKS TO SCHEDULE/PROCESS THE REFERRAL. IF YOU HAVE NOT HEARD  FROM US/SPECIALIST IN TWO WEEKS, PLEASE GIVE [place] A CALL AT [phone].

## 2023-04-05 ENCOUNTER — Other Ambulatory Visit: Payer: Self-pay | Admitting: Internal Medicine

## 2023-04-07 DIAGNOSIS — H9201 Otalgia, right ear: Secondary | ICD-10-CM | POA: Insufficient documentation

## 2023-04-07 DIAGNOSIS — H9203 Otalgia, bilateral: Secondary | ICD-10-CM | POA: Insufficient documentation

## 2023-04-07 DIAGNOSIS — H9313 Tinnitus, bilateral: Secondary | ICD-10-CM | POA: Insufficient documentation

## 2023-04-07 NOTE — Assessment & Plan Note (Signed)
Likely related to bulging TMs

## 2023-04-07 NOTE — Assessment & Plan Note (Signed)
She has bulging bilateral ears, given samples of zyrtec if not better return call to office

## 2023-04-07 NOTE — Assessment & Plan Note (Signed)
 She is encouraged to strive for BMI less than 30 to decrease cardiac risk. Advised to aim for at least 150 minutes of exercise per week.

## 2023-04-23 ENCOUNTER — Other Ambulatory Visit: Payer: Self-pay | Admitting: Internal Medicine

## 2023-04-28 ENCOUNTER — Other Ambulatory Visit: Payer: Self-pay | Admitting: Internal Medicine

## 2023-05-01 ENCOUNTER — Other Ambulatory Visit: Payer: Self-pay | Admitting: Internal Medicine

## 2023-05-01 DIAGNOSIS — N951 Menopausal and female climacteric states: Secondary | ICD-10-CM

## 2023-05-01 DIAGNOSIS — E785 Hyperlipidemia, unspecified: Secondary | ICD-10-CM

## 2023-05-12 ENCOUNTER — Encounter: Payer: Self-pay | Admitting: Internal Medicine

## 2023-05-12 ENCOUNTER — Ambulatory Visit (INDEPENDENT_AMBULATORY_CARE_PROVIDER_SITE_OTHER): Payer: BC Managed Care – PPO | Admitting: Internal Medicine

## 2023-05-12 VITALS — BP 122/80 | HR 84 | Temp 97.9°F | Ht 63.0 in | Wt 318.4 lb

## 2023-05-12 DIAGNOSIS — M25562 Pain in left knee: Secondary | ICD-10-CM | POA: Diagnosis not present

## 2023-05-12 DIAGNOSIS — E1169 Type 2 diabetes mellitus with other specified complication: Secondary | ICD-10-CM | POA: Diagnosis not present

## 2023-05-12 DIAGNOSIS — Z6841 Body Mass Index (BMI) 40.0 and over, adult: Secondary | ICD-10-CM

## 2023-05-12 DIAGNOSIS — E785 Hyperlipidemia, unspecified: Secondary | ICD-10-CM

## 2023-05-12 DIAGNOSIS — E66813 Obesity, class 3: Secondary | ICD-10-CM

## 2023-05-12 DIAGNOSIS — N951 Menopausal and female climacteric states: Secondary | ICD-10-CM

## 2023-05-12 DIAGNOSIS — I10 Essential (primary) hypertension: Secondary | ICD-10-CM | POA: Diagnosis not present

## 2023-05-12 DIAGNOSIS — G8929 Other chronic pain: Secondary | ICD-10-CM

## 2023-05-12 MED ORDER — TRULICITY 3 MG/0.5ML ~~LOC~~ SOAJ: 3.0000 mg | SUBCUTANEOUS | 2 refills | Status: DC

## 2023-05-12 NOTE — Patient Instructions (Signed)

## 2023-05-12 NOTE — Progress Notes (Signed)
I,Crystal Escobar, CMA,acting as a Neurosurgeon for Crystal Aliment, MD.,have documented all relevant documentation on the behalf of Crystal Aliment, MD,as directed by  Crystal Aliment, MD while in the presence of Crystal Aliment, MD.  Subjective:  Patient ID: Crystal Escobar , female    DOB: 1970/12/18 , 53 y.o.   MRN: [id number]  Chief Complaint  Patient presents with   Diabetes   Hypertension    HPI  Patient presents today for dm & bp follow up. She reports compliance with medications. She denies having any headaches, chest pain & sob. She has no specific questions or concerns.   She reports not yet scheduling mammogram & pap.  She would like referral to GYN.    Diabetes She presents for her follow-up diabetic visit. She has type 2 diabetes mellitus. There are no hypoglycemic associated symptoms. There are no diabetic associated symptoms. Pertinent negatives for diabetes include no blurred vision. There are no hypoglycemic complications. Risk factors for coronary artery disease include diabetes mellitus, dyslipidemia, hypertension, obesity and sedentary lifestyle. When asked about current treatments, none were reported. She is compliant with treatment some of the time. She is following a generally healthy diet. She participates in exercise intermittently. An ACE inhibitor/angiotensin II receptor blocker is being taken.  Hypertension This is a chronic problem. The current episode started more than 1 year ago. The problem has been gradually improving since onset. The problem is uncontrolled. Pertinent negatives include no blurred vision. Risk factors for coronary artery disease include obesity, sedentary lifestyle and diabetes mellitus. Past treatments include angiotensin blockers. The current treatment provides moderate improvement.     Past Medical History:  Diagnosis Date   Abnormal Pap smear 2011   Diabetes mellitus    DUB (dysfunctional uterine bleeding) 12/22/2012   HLD  (hyperlipidemia)    Hypertension    Sleep apnea    does not use machine as prescribed     Family History  Problem Relation Age of Onset   Heart attack Mother    Hypertension Mother    Diabetes Mother    Seizures Father    Hypertension Sister    Sickle cell anemia Maternal Aunt    Hypertension Maternal Aunt    Lung cancer Maternal Grandmother    Heart murmur Son    Colon cancer Neg Hx      Current Outpatient Medications:    atorvastatin (LIPITOR) 20 MG tablet, Take 1 tablet by mouth once daily, Disp: 90 tablet, Rfl: 0   CALCIUM PO, Take by mouth daily., Disp: , Rfl:    cetirizine (ZYRTEC) 10 MG tablet, Take 1 tablet (10 mg total) by mouth daily as needed for allergies., Disp: 30 tablet, Rfl: 4   Cholecalciferol (VITAMIN D3) 125 MCG (5000 UT) CAPS, Take 5,000 Units by mouth daily., Disp: , Rfl:    Dulaglutide (TRULICITY) 3 MG/0.5ML SOAJ, Inject 3 mg as directed once a week., Disp: 2 mL, Rfl: 2   FARXIGA 10 MG TABS tablet, TAKE 1 TABLET BY MOUTH ONCE DAILY BEFORE BREAKFAST, Disp: 30 tablet, Rfl: 0   Multiple Vitamin (MULTIVITAMIN) capsule, Take 1 capsule by mouth daily., Disp: , Rfl:    Probiotic Product (PROBIOTIC PO), Take 1 capsule by mouth daily., Disp: , Rfl:    valsartan (DIOVAN) 80 MG tablet, Take 1 tablet (80 mg total) by mouth daily., Disp: 90 tablet, Rfl: 2   venlafaxine XR (EFFEXOR-XR) 37.5 MG 24 hr capsule, Take 1 capsule by mouth once daily, Disp: 30  capsule, Rfl: 0   No Known Allergies   Review of Systems  Constitutional: Negative.   Eyes:  Negative for blurred vision.  Respiratory: Negative.    Cardiovascular: Negative.   Gastrointestinal: Negative.   Neurological: Negative.   Psychiatric/Behavioral: Negative.       Today's Vitals   05/12/23 1115  BP: 122/80  Pulse: 84  Temp: 97.9 F (36.6 C)  SpO2: 98%  Weight: (!) 318 lb 6.4 oz (144.4 kg)  Height: 5\' 3"  (1.6 m)   Body mass index is 56.4 kg/m.  Wt Readings from Last 3 Encounters:  05/12/23 (!)  318 lb 6.4 oz (144.4 kg)  03/31/23 (!) 310 lb (140.6 kg)  01/06/23 (!) 316 lb 3.2 oz (143.4 kg)     Objective:  Physical Exam Vitals and nursing note reviewed.  Constitutional:      Appearance: Normal appearance. She is obese.  HENT:     Head: Normocephalic and atraumatic.  Eyes:     Extraocular Movements: Extraocular movements intact.  Cardiovascular:     Rate and Rhythm: Normal rate and regular rhythm.     Heart sounds: Normal heart sounds.  Pulmonary:     Effort: Pulmonary effort is normal.     Breath sounds: Normal breath sounds.  Musculoskeletal:     Cervical back: Normal range of motion.  Skin:    General: Skin is warm.  Neurological:     General: No focal deficit present.     Mental Status: She is alert.  Psychiatric:        Mood and Affect: Mood normal.        Behavior: Behavior normal.         Assessment And Plan:  Dyslipidemia due to type 2 diabetes mellitus (HCC) Assessment & Plan: Chronic, I will increase Trulicity to 3mg  in hopes this will help her achieve greater weight loss. She will also continue with [name].    Orders: -     CMP14+EGFR -     Hemoglobin A1c -     Lipoprotein A (LPA)  HYPERTENSION, BENIGN ESSENTIAL Assessment & Plan: Chronic, controlled. She will c/w valsartan 80mg  daily. Advised to follow low sodium diet. She will f/u in 3-4 months.   Orders: -     CMP14+EGFR  Chronic pain of left knee Assessment & Plan: Chronic, sx likely due to osteoarthritis. I will refer her to Ortho for further evaluation.   Orders: -     Ambulatory referral to Orthopedic Surgery  Perimenopause -     Ambulatory referral to Gynecology  Class 3 severe obesity due to excess calories with serious comorbidity and body mass index (BMI) of 50.0 to 59.9 in adult [facility]) Assessment & Plan: BMI 56.   She is encouraged to aim for at least 150 minutes of exercise per week. She is encouraged to initially strive for BMI<50 (initial goal)to decrease cardiac risk.  She is aware that BMI likely needs to be 40 or below before she will be able to have orthopedic surgery.    Other orders -     Trulicity; Inject 3 mg as directed once a week.  Dispense: 2 mL; Refill: 2  She is encouraged to strive for BMI less than 30 to decrease cardiac risk. Advised to aim for at least 150 minutes of exercise per week.    Return for 4 MONTH DM .  Patient was given opportunity to ask questions. Patient verbalized understanding of the plan and was able to repeat key elements of  the plan. All questions were answered to their satisfaction.    I, Crystal Aliment, MD, have reviewed all documentation for this visit. The documentation on 05/12/23 for the exam, diagnosis, procedures, and orders are all accurate and complete.   IF YOU HAVE BEEN REFERRED TO A SPECIALIST, IT MAY TAKE 1-2 WEEKS TO SCHEDULE/PROCESS THE REFERRAL. IF YOU HAVE NOT HEARD FROM US/SPECIALIST IN TWO WEEKS, PLEASE GIVE [place] A CALL AT [phone].   THE PATIENT IS ENCOURAGED TO PRACTICE SOCIAL DISTANCING DUE TO THE COVID-19 PANDEMIC.

## 2023-05-14 LAB — CMP14+EGFR
ALT: 26 [IU]/L (ref 0–32)
AST: 28 [IU]/L (ref 0–40)
Albumin: 4 g/dL (ref 3.9–4.9)
Alkaline Phosphatase: 133 [IU]/L — ABNORMAL HIGH (ref 44–121)
BUN/Creatinine Ratio: 12 (ref 9–23)
BUN: 8 mg/dL (ref 6–24)
Bilirubin Total: 0.4 mg/dL (ref 0.0–1.2)
CO2: 24 mmol/L (ref 20–29)
Calcium: 9.6 mg/dL (ref 8.7–10.2)
Chloride: 105 mmol/L (ref 96–106)
Creatinine, Ser: 0.65 mg/dL (ref 0.57–1.00)
Globulin, Total: 3.3 g/dL (ref 1.5–4.5)
Glucose: 77 mg/dL (ref 70–99)
Potassium: 4.7 mmol/L (ref 3.5–5.2)
Sodium: 141 mmol/L (ref 134–144)
Total Protein: 7.3 g/dL (ref 6.0–8.5)
eGFR: 107 mL/min/{1.73_m2} (ref >59)

## 2023-05-14 LAB — HEMOGLOBIN A1C
Est. average glucose Bld gHb Est-mCnc: 126 mg/dL
Hgb A1c MFr Bld: 6 % — ABNORMAL HIGH (ref 4.8–5.6)

## 2023-05-14 LAB — LIPOPROTEIN A (LPA): Lipoprotein (a): 9.8 nmol/L (ref <75.0)

## 2023-05-17 ENCOUNTER — Encounter: Payer: Self-pay | Admitting: Internal Medicine

## 2023-05-17 DIAGNOSIS — Z6841 Body Mass Index (BMI) 40.0 and over, adult: Secondary | ICD-10-CM | POA: Insufficient documentation

## 2023-05-17 DIAGNOSIS — G8929 Other chronic pain: Secondary | ICD-10-CM | POA: Insufficient documentation

## 2023-05-17 NOTE — Assessment & Plan Note (Signed)
Chronic, I will increase Trulicity to 3mg  in hopes this will help her achieve greater weight loss. She will also continue with [name].

## 2023-05-17 NOTE — Assessment & Plan Note (Signed)
Chronic, controlled. She will c/w valsartan 80mg  daily. Advised to follow low sodium diet. She will f/u in 3-4 months.

## 2023-05-17 NOTE — Assessment & Plan Note (Signed)
BMI 56.   She is encouraged to aim for at least 150 minutes of exercise per week. She is encouraged to initially strive for BMI<50 (initial goal)to decrease cardiac risk. She is aware that BMI likely needs to be 40 or below before she will be able to have orthopedic surgery.

## 2023-05-17 NOTE — Assessment & Plan Note (Signed)
Chronic, sx likely due to osteoarthritis. I will refer her to Ortho for further evaluation.

## 2023-05-27 ENCOUNTER — Encounter: Payer: Self-pay | Admitting: Internal Medicine

## 2023-05-31 ENCOUNTER — Other Ambulatory Visit: Payer: Self-pay | Admitting: Internal Medicine

## 2023-05-31 DIAGNOSIS — N951 Menopausal and female climacteric states: Secondary | ICD-10-CM

## 2023-05-31 DIAGNOSIS — E1169 Type 2 diabetes mellitus with other specified complication: Secondary | ICD-10-CM

## 2023-06-02 ENCOUNTER — Other Ambulatory Visit: Payer: Self-pay

## 2023-06-02 MED ORDER — FLUCONAZOLE 150 MG PO TABS: ORAL_TABLET | ORAL | 0 refills | Status: DC

## 2023-06-09 ENCOUNTER — Encounter: Payer: Self-pay | Admitting: Orthopaedic Surgery

## 2023-06-09 ENCOUNTER — Other Ambulatory Visit (INDEPENDENT_AMBULATORY_CARE_PROVIDER_SITE_OTHER): Payer: BC Managed Care – PPO

## 2023-06-09 ENCOUNTER — Ambulatory Visit: Payer: BC Managed Care – PPO | Admitting: Orthopaedic Surgery

## 2023-06-09 ENCOUNTER — Other Ambulatory Visit (INDEPENDENT_AMBULATORY_CARE_PROVIDER_SITE_OTHER): Payer: Self-pay

## 2023-06-09 DIAGNOSIS — M25562 Pain in left knee: Secondary | ICD-10-CM

## 2023-06-09 DIAGNOSIS — M542 Cervicalgia: Secondary | ICD-10-CM

## 2023-06-09 DIAGNOSIS — G8929 Other chronic pain: Secondary | ICD-10-CM | POA: Diagnosis not present

## 2023-06-09 DIAGNOSIS — M25512 Pain in left shoulder: Secondary | ICD-10-CM

## 2023-06-09 DIAGNOSIS — Z6841 Body Mass Index (BMI) 40.0 and over, adult: Secondary | ICD-10-CM

## 2023-06-09 MED ORDER — BUPIVACAINE HCL 0.5 % IJ SOLN
3.0000 mL | INTRAMUSCULAR | Status: AC | PRN
  Administered 2023-06-09: 3 mL via INTRA_ARTICULAR

## 2023-06-09 MED ORDER — METHYLPREDNISOLONE ACETATE 40 MG/ML IJ SUSP
40.0000 mg | INTRAMUSCULAR | Status: AC | PRN
  Administered 2023-06-09: 40 mg via INTRA_ARTICULAR

## 2023-06-09 MED ORDER — LIDOCAINE HCL 1 % IJ SOLN
3.0000 mL | INTRAMUSCULAR | Status: AC | PRN
Start: 2023-06-09 — End: 2023-06-09
  Administered 2023-06-09: 3 mL

## 2023-06-09 NOTE — Progress Notes (Signed)
Office Visit Note   Patient: Crystal Escobar           Date of Birth: 1970-06-18           MRN: [id number] Visit Date: 06/09/2023              Requested by: Dorothyann Peng, MD [id number] [place],  [place] [address] PCP: Dorothyann Peng, MD   Assessment & Plan: Visit Diagnoses:  1. Chronic pain of left knee   2. Chronic left shoulder pain   3. Neck pain   4. BMI 50.0-59.9, adult [facility])     Plan: 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  For the knee, or cortisone shots have not been effective.  She feels that the knee pops out of place and is giving way.  Due to the severity of the DJD her best option is a knee replacement however her BMI is greater than 54 at this time and not a surgical candidate.  She will need to lose approximately 100 pounds to achieve a goal weight of 225 pounds before it is safe to undergo surgery.  In regards to the shoulder we performed a subacromial injection and I provided home exercise program for her to do once her shoulder feels better from the shot.  Follow-up if symptoms do not improve.  The patient meets the AMA guidelines for Morbid (severe) obesity with a BMI > 40.0 and I have recommended weight loss.  Follow-Up Instructions: No follow-ups on file.   Orders:  Orders Placed This Encounter  Procedures   Large Joint Inj: L subacromial bursa   XR KNEE 3 VIEW LEFT   XR Shoulder Left   XR Cervical Spine 2 or 3 views   No orders of the defined types were placed in this encounter.     Procedures: Large Joint Inj: L subacromial bursa on 06/09/2023 10:59 AM Indications: pain Details: 22 G needle  Arthrogram: No  Medications: 3 mL lidocaine 1 %; 3 mL bupivacaine 0.5 %; 40 mg methylPREDNISolone acetate 40 MG/ML Outcome: tolerated well, no immediate complications Patient was prepped and draped in the usual sterile fashion.        Clinical Data: No additional findings.   Subjective: Chief Complaint  Patient presents with   Left Knee - Pain    HPI Patient is a very pleasant 53 year old female here for evaluation of chronic severe left knee pain and left shoulder pain.  She has had years of left knee pain and it is getting worse.  She is experiencing giving way and popping sensation.  She reports start up stiffness and pain.  Denies any previous injuries.  In regards to the shoulder it has been hurting for about 2 months.  Radiates up and down the arm to the lateral deltoid.  Denies any injuries or changes in activity.  Denies any radicular symptoms. Review of Systems  Constitutional: Negative.   HENT: Negative.    Eyes: Negative.   Respiratory: Negative.    Cardiovascular: Negative.   Endocrine: Negative.   Musculoskeletal: Negative.   Neurological: Negative.   Hematological: Negative.   Psychiatric/Behavioral: Negative.    All other systems reviewed and are negative.    Objective: Vital Signs: LMP 10/05/2022 (Exact Date)   Physical Exam Vitals and nursing note reviewed.  Constitutional:      Appearance: She is well-developed.  HENT:  Head: Atraumatic.     Nose: Nose normal.  Eyes:     Extraocular Movements: Extraocular movements intact.  Cardiovascular:     Pulses: Normal pulses.  Pulmonary:     Effort: Pulmonary effort is normal.  Abdominal:     Palpations: Abdomen is soft.  Musculoskeletal:     Cervical back: Neck supple.  Skin:    General: Skin is warm.     Capillary Refill: Capillary refill takes less than 2 seconds.  Neurological:     Mental Status: She is alert. Mental status is at baseline.  Psychiatric:        Behavior: Behavior normal.        Thought Content: Thought content normal.        Judgment: Judgment normal.     Ortho Exam Examination of the left shoulder shows normal passive and active range of motion with mild pain towards the extremes.  Manual  muscle testing of the rotator cuff is normal with slight pain.  Pain with Hawkins impingement.  Negative Neer impingement.  Examination of the left knee shows valgus alignment.  Baseline range of motion.  Collaterals are stable. Specialty Comments:  No specialty comments available.  Imaging: XR Shoulder Left Result Date: 06/09/2023 X-rays of the left shoulder shows minor degenerative changes without acute abnormalities.  XR Cervical Spine 2 or 3 views Result Date: 06/09/2023 X-rays of the cervical spine show diffuse degenerative changes.  No acute abnormalities.  XR KNEE 3 VIEW LEFT Result Date: 06/09/2023 X-rays of the left knee show advanced tricompartmental degenerative joint disease.  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   Right hand pain 02/20/2021   Status post bariatric surgery 02/20/2021   Special screening for malignant neoplasms, colon 07/09/2020   Morbid obesity (HCC) 07/09/2020   Morbid obesity with body mass index (BMI) of 60.0 to 69.9 in adult (HCC) 06/30/2018   Obesity with alveolar hypoventilation and body mass index (BMI) of 40 or greater (HCC) 06/30/2018   Carpal tunnel syndrome, left 05/26/2017   Morton's neuroma of left foot 05/26/2017   DUB (dysfunctional uterine bleeding) 12/22/2012   Obstructive sleep apnea 07/15/2006   HYPERLIPIDEMIA 06/07/2006   DYSMETABOLIC SYNDROME 06/07/2006    LIVER FUNCTION TESTS, ABNORMAL 06/07/2006   BMI 50.0-59.9, adult (HCC) 05/24/2006   TOBACCO ABUSE 05/24/2006   HYPERTENSION, BENIGN ESSENTIAL 05/24/2006   Osteoarthritis 05/24/2006   ARTHROSCOPY, LEFT KNEE, HX OF 05/24/2006   Past Medical History:  Diagnosis Date   Abnormal Pap smear 2011   Diabetes mellitus    DUB (dysfunctional uterine bleeding) 12/22/2012   HLD (hyperlipidemia)    Hypertension    Sleep apnea    does not use machine as prescribed    Family History  Problem Relation Age of Onset   Heart attack Mother    Hypertension Mother    Diabetes Mother    Seizures Father    Hypertension Sister    Sickle  cell anemia Maternal Aunt    Hypertension Maternal Aunt    Lung cancer Maternal Grandmother    Heart murmur Son    Colon cancer Neg Hx     Past Surgical History:  Procedure Laterality Date   CARPAL TUNNEL RELEASE Right 2016   CESAREAN SECTION     COLONOSCOPY WITH PROPOFOL N/A 09/10/2020   Procedure: COLONOSCOPY WITH PROPOFOL;  Surgeon: Lanelle Bal, DO;  Location: [facility];  Service: Endoscopy;  Laterality: N/A;  AM (diabetic)   GASTRIC BYPASS  05/2019   [facility]: Dr. Artist Pais   KNEE SURGERY Left    cartilage   POLYPECTOMY  09/10/2020   Procedure: POLYPECTOMY;  Surgeon: Lanelle Bal, DO;  Location: [facility];  Service: Endoscopy;;   TUBAL LIGATION     Social History   Occupational History   Not on file  Tobacco Use   Smoking status: Former    Current packs/day: 0.00    Average packs/day: 1 pack/day for 10.0 years (10.0 ttl pk-yrs)    Types: Cigars, Cigarettes    Start date: 06/03/2008    Quit date: 06/03/2018    Years since quitting: 5.0    Passive exposure: Never   Smokeless tobacco: Never  Vaping Use   Vaping status: Never Used  Substance and Sexual Activity   Alcohol use: Yes    Comment: occasionally    Drug use: No   Sexual activity: Yes    Partners: Male    Birth control/protection: Surgical    Comment: BTL

## 2023-06-15 ENCOUNTER — Other Ambulatory Visit: Payer: Self-pay | Admitting: Internal Medicine

## 2023-06-16 ENCOUNTER — Other Ambulatory Visit: Payer: Self-pay | Admitting: Internal Medicine

## 2023-06-28 ENCOUNTER — Other Ambulatory Visit: Payer: Self-pay | Admitting: Internal Medicine

## 2023-06-28 DIAGNOSIS — N951 Menopausal and female climacteric states: Secondary | ICD-10-CM

## 2023-07-11 ENCOUNTER — Other Ambulatory Visit: Payer: Self-pay | Admitting: Internal Medicine

## 2023-07-11 DIAGNOSIS — E1169 Type 2 diabetes mellitus with other specified complication: Secondary | ICD-10-CM

## 2023-07-20 NOTE — Progress Notes (Unsigned)
 I,Crystal Escobar, CMA,acting as a Neurosurgeon for Crystal Aliment, MD.,have documented all relevant documentation on the behalf of Crystal Aliment, MD,as directed by  Crystal Aliment, MD while in the presence of Crystal Aliment, MD.  Subjective:  Patient ID: Crystal Escobar , female    DOB: Sep 01, 1970 , 53 y.o.   MRN: [id number]  No chief complaint on file.   HPI  HPI   Past Medical History:  Diagnosis Date   Abnormal Pap smear 2011   Diabetes mellitus    DUB (dysfunctional uterine bleeding) 12/22/2012   HLD (hyperlipidemia)    Hypertension    Sleep apnea    does not use machine as prescribed     Family History  Problem Relation Age of Onset   Heart attack Mother    Hypertension Mother    Diabetes Mother    Seizures Father    Hypertension Sister    Sickle cell anemia Maternal Aunt    Hypertension Maternal Aunt    Lung cancer Maternal Grandmother    Heart murmur Son    Colon cancer Neg Hx      Current Outpatient Medications:    atorvastatin (LIPITOR) 20 MG tablet, Take 1 tablet by mouth once daily, Disp: 90 tablet, Rfl: 0   CALCIUM PO, Take by mouth daily., Disp: , Rfl:    cetirizine (ZYRTEC) 10 MG tablet, Take 1 tablet (10 mg total) by mouth daily as needed for allergies., Disp: 30 tablet, Rfl: 4   Cholecalciferol (VITAMIN D3) 125 MCG (5000 UT) CAPS, Take 5,000 Units by mouth daily., Disp: , Rfl:    Dulaglutide (TRULICITY) 3 MG/0.5ML SOAJ, Inject 3 mg as directed once a week., Disp: 2 mL, Rfl: 2   FARXIGA 10 MG TABS tablet, TAKE 1 TABLET BY MOUTH ONCE DAILY BEFORE BREAKFAST, Disp: 30 tablet, Rfl: 0   fluconazole (DIFLUCAN) 150 MG tablet, po today, repeat in 48 hours if needed., Disp: 2 tablet, Rfl: 0   Multiple Vitamin (MULTIVITAMIN) capsule, Take 1 capsule by mouth daily., Disp: , Rfl:    Probiotic Product (PROBIOTIC PO), Take 1 capsule by mouth daily., Disp: , Rfl:    valsartan (DIOVAN) 80 MG tablet, Take 1 tablet by mouth once daily, Disp: 90 tablet, Rfl: 2    venlafaxine XR (EFFEXOR-XR) 37.5 MG 24 hr capsule, Take 1 capsule by mouth once daily, Disp: 30 capsule, Rfl: 0   No Known Allergies   Review of Systems   There were no vitals filed for this visit. There is no height or weight on file to calculate BMI.  Wt Readings from Last 3 Encounters:  05/12/23 (!) 318 lb 6.4 oz (144.4 kg)  03/31/23 (!) 310 lb (140.6 kg)  01/06/23 (!) 316 lb 3.2 oz (143.4 kg)     Objective:  Physical Exam      Assessment And Plan:  Swollen ankles     No follow-ups on file.  Patient was given opportunity to ask questions. Patient verbalized understanding of the plan and was able to repeat key elements of the plan. All questions were answered to their satisfaction.  Crystal Aliment, MD  I, Crystal Aliment, MD, have reviewed all documentation for this visit. The documentation on 07/20/23 for the exam, diagnosis, procedures, and orders are all accurate and complete.   IF YOU HAVE BEEN REFERRED TO A SPECIALIST, IT MAY TAKE 1-2 WEEKS TO SCHEDULE/PROCESS THE REFERRAL. IF YOU HAVE NOT HEARD FROM US/SPECIALIST IN TWO WEEKS, PLEASE GIVE [place] A  CALL AT [phone].   THE PATIENT IS ENCOURAGED TO PRACTICE SOCIAL DISTANCING DUE TO THE COVID-19 PANDEMIC.

## 2023-07-21 ENCOUNTER — Encounter: Payer: Self-pay | Admitting: Internal Medicine

## 2023-07-21 ENCOUNTER — Ambulatory Visit (INDEPENDENT_AMBULATORY_CARE_PROVIDER_SITE_OTHER): Admitting: Internal Medicine

## 2023-07-21 VITALS — BP 128/82 | HR 80 | Temp 97.9°F | Ht 63.0 in | Wt 333.2 lb

## 2023-07-21 DIAGNOSIS — E66813 Obesity, class 3: Secondary | ICD-10-CM

## 2023-07-21 DIAGNOSIS — R0609 Other forms of dyspnea: Secondary | ICD-10-CM | POA: Insufficient documentation

## 2023-07-21 DIAGNOSIS — I10 Essential (primary) hypertension: Secondary | ICD-10-CM | POA: Diagnosis not present

## 2023-07-21 DIAGNOSIS — M25571 Pain in right ankle and joints of right foot: Secondary | ICD-10-CM | POA: Insufficient documentation

## 2023-07-21 DIAGNOSIS — M25572 Pain in left ankle and joints of left foot: Secondary | ICD-10-CM

## 2023-07-21 DIAGNOSIS — M25471 Effusion, right ankle: Secondary | ICD-10-CM

## 2023-07-21 DIAGNOSIS — Z6841 Body Mass Index (BMI) 40.0 and over, adult: Secondary | ICD-10-CM

## 2023-07-21 MED ORDER — HYDROCHLOROTHIAZIDE 12.5 MG PO TABS
12.5000 mg | ORAL_TABLET | Freq: Every day | ORAL | 1 refills | Status: DC
Start: 2023-07-21 — End: 2023-12-01

## 2023-07-22 ENCOUNTER — Encounter: Payer: Self-pay | Admitting: Internal Medicine

## 2023-07-22 NOTE — Patient Instructions (Signed)
 Echocardiogram An echocardiogram is a test that uses sound waves to make images of your heart. This way of making images is often called ultrasound. The images from this test can help find out many things about your heart, including: The size and shape of your heart. The strength of your heart muscle and how well it's working. The size, thickness, and movement of your heart's walls. How your heart valves are working. Problems such as: A tumor or a growth from an infection around the heart valves. Areas of heart muscle that aren't working well because of poor blood flow or injury from a heart attack. An aneurysm. This is a weak or damaged part of an artery wall. An artery is a blood vessel. Tell a health care provider about: Any allergies you have. All medicines you're taking, including vitamins, herbs, eye drops, creams, and over-the-counter medicines. Any bleeding problems you have. Any surgeries you've had. Any medical problems you have. Whether you're pregnant or may be pregnant. What are the risks? Your health care provider will talk with you about risks. These may include an allergic reaction to IV dye that may be used during the test. What happens before the test? You don't need to do anything to get ready for this test. You may eat and drink normally. What happens during the test?  You'll take off your clothes from the waist up and put on a hospital gown. Sticky patches called electrodes may be placed on your chest. These will be connected to a machine that monitors your heart rate and rhythm. You'll lie down on a table for the exam. A wand covered in gel will be moved over your chest. Sound waves from the wand will go to your heart and bounce back--or "echo" back. The sound waves will go to a computer that uses them to make images of your heart. The images can be viewed on a monitor. The images will also be recorded on the computer so your provider can look at them later. You may  be asked to change positions or hold your breath for a short time. This makes it easier to get different views or better views of your heart. In some cases, you may be given a dye through an IV. The IV is put into one of your veins. This dye can make the areas of your heart easier to see. The procedure may vary among providers and hospitals. What can I expect after the test? You may return to your normal diet, activities, and medicines unless your provider tells you not to. If an IV was placed for the test, it will be removed. It's up to you to get the results of your test. Ask your provider, or the department that's doing the test, when your results will be ready. This information is not intended to replace advice given to you by your health care provider. Make sure you discuss any questions you have with your health care provider. Document Revised: 06/12/2022 Document Reviewed: 06/12/2022 Elsevier Patient Education  2024 [employer].

## 2023-07-22 NOTE — Assessment & Plan Note (Signed)
 Chronic, controlled. She will c/w valsartan 80mg  daily. I will add hydrochlorothiazide 12.5mg  daily.  She agrees to rto in two weeks for NV/Lab visit.  She is advised to follow low sodium diet.

## 2023-07-22 NOTE — Assessment & Plan Note (Signed)
 She is aware of 15lb weight gain since January. It appears most of this is fluid. She is encouraged to gradually increase her daily activity level.

## 2023-07-22 NOTE — Assessment & Plan Note (Signed)
 She has b/l ankle swelling. She does have other arthralgias. I will check an arthritis panel. I will make further recommendations once her labs are available for review.

## 2023-07-23 LAB — BRAIN NATRIURETIC PEPTIDE: BNP: 13.8 pg/mL (ref 0.0–100.0)

## 2023-07-23 LAB — URIC ACID: Uric Acid: 3.9 mg/dL (ref 2.6–6.2)

## 2023-07-23 LAB — SEDIMENTATION RATE: Sed Rate: 70 mm/h — ABNORMAL HIGH (ref 0–40)

## 2023-07-23 LAB — CBC
Hematocrit: 42.5 % (ref 34.0–46.6)
Hemoglobin: 13.5 g/dL (ref 11.1–15.9)
MCH: 29.2 pg (ref 26.6–33.0)
MCHC: 31.8 g/dL (ref 31.5–35.7)
MCV: 92 fL (ref 79–97)
Platelets: 320 10*3/uL (ref 150–450)
RBC: 4.63 x10E6/uL (ref 3.77–5.28)
RDW: 13.7 % (ref 11.7–15.4)
WBC: 6.2 10*3/uL (ref 3.4–10.8)

## 2023-07-23 LAB — CYCLIC CITRUL PEPTIDE ANTIBODY, IGG/IGA: Cyclic Citrullin Peptide Ab: 9 U (ref 0–19)

## 2023-07-23 LAB — ANTINUCLEAR ANTIBODIES, IFA: ANA Titer 1: NEGATIVE

## 2023-07-23 LAB — RHEUMATOID FACTOR: Rheumatoid fact SerPl-aCnc: 10.3 [IU]/mL (ref <14.0)

## 2023-07-29 ENCOUNTER — Other Ambulatory Visit: Payer: Self-pay | Admitting: Internal Medicine

## 2023-08-03 ENCOUNTER — Other Ambulatory Visit: Payer: Self-pay | Admitting: Internal Medicine

## 2023-08-03 DIAGNOSIS — N951 Menopausal and female climacteric states: Secondary | ICD-10-CM

## 2023-08-04 ENCOUNTER — Other Ambulatory Visit

## 2023-08-04 DIAGNOSIS — I10 Essential (primary) hypertension: Secondary | ICD-10-CM

## 2023-08-05 LAB — BMP8+EGFR
BUN/Creatinine Ratio: 15 (ref 9–23)
BUN: 11 mg/dL (ref 6–24)
CO2: 24 mmol/L (ref 20–29)
Calcium: 9.6 mg/dL (ref 8.7–10.2)
Chloride: 101 mmol/L (ref 96–106)
Creatinine, Ser: 0.75 mg/dL (ref 0.57–1.00)
Glucose: 95 mg/dL (ref 70–99)
Potassium: 4.5 mmol/L (ref 3.5–5.2)
Sodium: 139 mmol/L (ref 134–144)
eGFR: 97 mL/min/{1.73_m2} (ref >59)

## 2023-08-10 ENCOUNTER — Other Ambulatory Visit: Payer: Self-pay | Admitting: Internal Medicine

## 2023-08-10 DIAGNOSIS — E1169 Type 2 diabetes mellitus with other specified complication: Secondary | ICD-10-CM

## 2023-08-11 ENCOUNTER — Ambulatory Visit (HOSPITAL_COMMUNITY)
Admission: RE | Admit: 2023-08-11 | Discharge: 2023-08-11 | Disposition: A | Discharge status: Home / Self Care | Source: Ambulatory Visit | Attending: Internal Medicine | Admitting: Internal Medicine

## 2023-08-11 DIAGNOSIS — R0609 Other forms of dyspnea: Secondary | ICD-10-CM | POA: Insufficient documentation

## 2023-08-11 LAB — ECHOCARDIOGRAM COMPLETE
Area-P 1/2: 3.34 cm2
S' Lateral: 2.2 cm

## 2023-08-14 ENCOUNTER — Encounter: Payer: Self-pay | Admitting: Internal Medicine

## 2023-08-21 ENCOUNTER — Other Ambulatory Visit: Payer: Self-pay | Admitting: Internal Medicine

## 2023-08-30 ENCOUNTER — Other Ambulatory Visit: Payer: Self-pay | Admitting: Nurse Practitioner

## 2023-08-30 ENCOUNTER — Other Ambulatory Visit: Payer: Self-pay | Admitting: Internal Medicine

## 2023-08-30 DIAGNOSIS — N951 Menopausal and female climacteric states: Secondary | ICD-10-CM

## 2023-08-30 DIAGNOSIS — H9203 Otalgia, bilateral: Secondary | ICD-10-CM

## 2023-09-01 ENCOUNTER — Encounter: Payer: Self-pay | Admitting: Internal Medicine

## 2023-09-01 ENCOUNTER — Ambulatory Visit: Admitting: Internal Medicine

## 2023-09-01 VITALS — BP 118/82 | HR 90 | Temp 98.1°F | Ht 63.0 in | Wt 335.4 lb

## 2023-09-01 DIAGNOSIS — F4323 Adjustment disorder with mixed anxiety and depressed mood: Secondary | ICD-10-CM | POA: Insufficient documentation

## 2023-09-01 DIAGNOSIS — H9313 Tinnitus, bilateral: Secondary | ICD-10-CM

## 2023-09-01 DIAGNOSIS — B3731 Acute candidiasis of vulva and vagina: Secondary | ICD-10-CM | POA: Insufficient documentation

## 2023-09-01 DIAGNOSIS — N951 Menopausal and female climacteric states: Secondary | ICD-10-CM

## 2023-09-01 DIAGNOSIS — E66813 Obesity, class 3: Secondary | ICD-10-CM

## 2023-09-01 DIAGNOSIS — E1169 Type 2 diabetes mellitus with other specified complication: Secondary | ICD-10-CM | POA: Diagnosis not present

## 2023-09-01 DIAGNOSIS — E785 Hyperlipidemia, unspecified: Secondary | ICD-10-CM | POA: Diagnosis not present

## 2023-09-01 DIAGNOSIS — Z6841 Body Mass Index (BMI) 40.0 and over, adult: Secondary | ICD-10-CM

## 2023-09-01 MED ORDER — VENLAFAXINE HCL ER 75 MG PO CP24: ORAL_CAPSULE | ORAL | 2 refills | Status: AC

## 2023-09-01 MED ORDER — FLUCONAZOLE 150 MG PO TABS: ORAL_TABLET | ORAL | 0 refills | Status: DC

## 2023-09-01 NOTE — Assessment & Plan Note (Signed)
 Sx likely exacerbated by Farxiga . Will send rx fluconazole .

## 2023-09-01 NOTE — Assessment & Plan Note (Signed)
 Symptoms may be exacerbated by menopause. - Increase Effexor  dose by taking two tablets in the morning until current supply is exhausted, then switch to higher dose 75mg   - Refer to psychiatrist as requested for evaluation and medication management. - Refer to therapist Darreld Elms for counseling.

## 2023-09-01 NOTE — Assessment & Plan Note (Signed)
 Type 2 diabetes with A1c of 6.0, indicating good control. Recurrent yeast infections likely due to Farxiga . - Decrease dose to 5mg  daily - Schedule follow-up in one month for response assessment and blood work.

## 2023-09-01 NOTE — Assessment & Plan Note (Signed)
 Perimenopausal symptoms likely contributing to depression and anxiety. - Start medication to manage menopausal symptoms. - She will continue with OTC supplement

## 2023-09-01 NOTE — Patient Instructions (Signed)
 Perimenopause: What to Know Perimenopause is the time in your life when your levels of estrogen start to go down. Estrogen is the female hormone made by your ovaries. Perimenopause can start 2-8 years before menopause. It can cause changes to your menstrual period. During this time, your ovaries may or may not make an egg. In many cases, you can still get pregnant. What are the causes? Perimenopause is a natural change in your homone levels that happens as you get older. What increases the risk? You're more likely to start perimenopause Girtrude Enslin if: You have an abnormal growth (tumor) of the pituitary gland in your brain. You have a disease that affects your ovaries. You've had certain treatments for cancer. These include: Chemotherapy. Hormone therapy. Radiation therapy on the area between your hips (pelvis). You smoke a lot or drink a lot of alcohol. Other family members have gone through menopause Andrena Margerum. What are the signs or symptoms? Symptoms are unique to each person. You may have: Hot flashes. Irregular periods. Night sweats. Changes in how you feel about sex. You may have less of a sex drive or feel more discomfort around your sexuality. Vaginal dryness. Headaches. Mood swings. Other symptoms may include: Depression. This is when you feel sad or hopeless. Trouble sleeping. Memory problems or trouble focusing. Irritability. This means getting annoyed easily. Tiredness. Weight gain. Anxiety. This is feeling worried or nervous. You can also have trouble getting pregnant. How is this diagnosed? You may be diagnosed based on: Your medical history. An exam. Your age. Your history of menstrual periods. Your symptoms. Hormone tests. How is this treated? In some cases, no treatment is needed. Talk with your health care provider about if you should get treated. Treatments may include: Menopausal hormone therapy (MHT). Medicines to treat certain  symptoms. Acupuncture. Vitamin or herbal supplements. Before you start treatment, let your provider know if you or anyone in your family has or has had: Heart disease. Breast cancer. Blood clots. Diabetes. Osteoporosis. Follow these instructions at home: Eating and drinking  Eat a balanced diet. It should include: Fresh fruits and vegetables. Whole grains. Soybeans. Eggs. Lean meat. Low-fat dairy. To help prevent hot flashes, stay away from: Alcohol. Drinks with caffeine in them. Spicy foods. Lifestyle Do not smoke, vape, or use nicotine or tobacco. Get at least 30 minutes of physical activity on 5 or more days each week. Get 7-8 hours of sleep each night. Dress in layers that can be taken off if you have a hot flash. Find ways to manage stress. You may want to try: Deep breathing. Meditation. Writing in a journal. General instructions  Take your medicines only as told. Keep track of your periods. Track: When they happen. How heavy they are. How long they last. How much time passes between periods. Keep track of your symptoms. Track: When they start. How often you have them. How long they last. Use vaginal lubricants or moisturizers. These can help with: Vaginal dryness. Comfort during sex. You can still get pregnant if you're having any periods. Make sure you use birth control if you don't want to get pregnant. Contact a health care provider if: You have a very heavy period or pass blood clots. Your period lasts more than 2 days longer than normal. Your period comes back sooner than 21 days. You bleed after having sex. You have pain during sex. You have pain when you pee. You get very bad headaches. You have trouble with your eyesight. Get help right away if: You  have chest pain. You have trouble breathing. You have trouble talking. You have very bad depression. This information is not intended to replace advice given to you by your health care provider.  Make sure you discuss any questions you have with your health care provider. Document Revised: 12/17/2022 Document Reviewed: 12/17/2022 Elsevier Patient Education  2024 [employer].

## 2023-09-01 NOTE — Progress Notes (Signed)
 I,Crystal Escobar, CMA,acting as a Neurosurgeon for Smiley Dung, MD.,have documented all relevant documentation on the behalf of Smiley Dung, MD,as directed by  Smiley Dung, MD while in the presence of Smiley Dung, MD.  Subjective:  Patient ID: Crystal Escobar , female    DOB: 1970/11/18 , 53 y.o.   MRN: [id number]  Chief Complaint  Patient presents with   Referral For Psychiatrist     Patient presents today for referral for psychiatrist. She states having a lot of things going on personally. She knows she should talk to someone about, outside of family. 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 She does not know if this is also due to menopause. She experiences night sweats & restless nights.  She also would like her ears looked at while here today.  She has mammo/pap scheduled for next month.     HPI Discussed the use of AI scribe software for clinical note transcription with the patient, who gave verbal consent to proceed.  History of Present Illness Crystal Escobar is a 53 year old female who presents with ear pain and mood disturbances.  She experiences pain in her left ear along with occasional tinnitus. No fever/chills or hearing deficits.  She is experiencing mood disturbances, which she attributes to menopause. She feels down or depressed about three to four days a week, has difficulty falling asleep, and feels very tired over the past couple of weeks. She reports fluctuating appetite and difficulty concentrating every day. No feelings of hopelessness, self-harm thoughts, or changes in movement speed. These symptoms affect her ability to manage home and work responsibilities, causing anxiety and mood swings. She is currently taking Effexor  every night, which she feels is not helping, and she sometimes forgets to take her supplements.  She has not had a menstrual cycle for three months after stopping birth control pills, with only minimal spotting one month.  She has  been experiencing recurrent yeast infections, which she suspects may be related to her diabetes medication, Farxiga . No high blood sugar levels and she primarily drinks coffee and water . Her last A1c was 6.0, indicating good control of her diabetes.    Past Medical History:  Diagnosis Date   Abnormal Pap smear 2011   Diabetes mellitus    DUB (dysfunctional uterine bleeding) 12/22/2012   HLD (hyperlipidemia)    Hypertension    Sleep apnea    does not use machine as prescribed     Family History  Problem Relation Age of Onset   Heart attack Mother    Hypertension Mother    Diabetes Mother    Seizures Father    Hypertension Sister    Sickle cell anemia Maternal Aunt    Hypertension Maternal Aunt    Lung cancer Maternal Grandmother    Heart murmur Son    Colon cancer Neg Hx      Current Outpatient Medications:    atorvastatin  (LIPITOR) 20 MG tablet, Take 1 tablet by mouth once daily, Disp: 90 tablet, Rfl: 0   CALCIUM  PO, Take by mouth daily., Disp: , Rfl:    cetirizine  (ZYRTEC ) 10 MG tablet, TAKE 1 TABLET BY MOUTH ONCE DAILY AS NEEDED FOR ALLERGIES, Disp: 30 tablet, Rfl: 0   Cholecalciferol (VITAMIN D3) 125 MCG (5000 UT) CAPS, Take 5,000 Units by mouth daily., Disp: , Rfl:    FARXIGA  10 MG TABS tablet, TAKE 1 TABLET BY MOUTH ONCE DAILY BEFORE BREAKFAST, Disp: 30 tablet, Rfl: 0  hydrochlorothiazide  (HYDRODIURIL ) 12.5 MG tablet, Take 1 tablet (12.5 mg total) by mouth daily., Disp: 90 tablet, Rfl: 1   Multiple Vitamin (MULTIVITAMIN) capsule, Take 1 capsule by mouth daily., Disp: , Rfl:    Probiotic Product (PROBIOTIC PO), Take 1 capsule by mouth daily., Disp: , Rfl:    TRULICITY  3 MG/0.5ML SOAJ, INJECT 3 MG SUBCUTANEOUSLY ONCE A WEEK, Disp: 4 mL, Rfl: 0   valsartan  (DIOVAN ) 80 MG tablet, Take 1 tablet by mouth once daily, Disp: 90 tablet, Rfl: 2   venlafaxine  XR (EFFEXOR  XR) 75 MG 24 hr capsule, One capsule po qd, Disp: 30 capsule, Rfl: 2   fluconazole  (DIFLUCAN ) 150 MG tablet, po  today, repeat in 48 hours if needed., Disp: 2 tablet, Rfl: 0   No Known Allergies   Review of Systems  Constitutional: Negative.   Respiratory: Negative.    Cardiovascular: Negative.   Neurological: Negative.   Psychiatric/Behavioral:  Positive for dysphoric mood. The patient is nervous/anxious.      Today's Vitals   09/01/23 1541  BP: 118/82  Pulse: 90  Temp: 98.1 F (36.7 C)  SpO2: 98%  Weight: (!) 335 lb 6.4 oz (152.1 kg)  Height: 5\' 3"  (1.6 m)   Body mass index is 59.41 kg/m.  Wt Readings from Last 3 Encounters:  09/01/23 (!) 335 lb 6.4 oz (152.1 kg)  07/21/23 (!) 333 lb 3.2 oz (151.1 kg)  05/12/23 (!) 318 lb 6.4 oz (144.4 kg)     Objective:  Physical Exam Vitals and nursing note reviewed.  Constitutional:      Appearance: Normal appearance. She is obese.  HENT:     Head: Normocephalic and atraumatic.  Eyes:     Extraocular Movements: Extraocular movements intact.  Cardiovascular:     Rate and Rhythm: Normal rate and regular rhythm.     Heart sounds: Normal heart sounds.  Pulmonary:     Effort: Pulmonary effort is normal.     Breath sounds: Normal breath sounds.  Musculoskeletal:     Cervical back: Normal range of motion.  Skin:    General: Skin is warm.  Neurological:     General: No focal deficit present.     Mental Status: She is alert.  Psychiatric:        Mood and Affect: Mood normal.        Behavior: Behavior normal.         Assessment And Plan:  Adjustment disorder with mixed anxiety and depressed mood Assessment & Plan: Symptoms may be exacerbated by menopause. - Increase Effexor  dose by taking two tablets in the morning until current supply is exhausted, then switch to higher dose 75mg   - Refer to psychiatrist as requested for evaluation and medication management. - Refer to therapist Darreld Elms for counseling.  Orders: -     Ambulatory referral to Psychiatry -     Ambulatory referral to Psychology  Perimenopause Assessment &  Plan: Perimenopausal symptoms likely contributing to depression and anxiety. - Start medication to manage menopausal symptoms. - She will continue with OTC supplement   Tinnitus of both ears -     Ambulatory referral to Audiology  Vaginal candidiasis Assessment & Plan: Sx likely exacerbated by Farxiga . Will send rx fluconazole .    Class 3 severe obesity due to excess calories with serious comorbidity and body mass index (BMI) of 50.0 to 59.9 in adult Assessment & Plan: She is encouraged to gradually increase her daily activity level. Advised to aim for at least 150 minutes  of exercise per week.    Dyslipidemia due to type 2 diabetes mellitus (HCC) Assessment & Plan: Type 2 diabetes with A1c of 6.0, indicating good control. Recurrent yeast infections likely due to Farxiga . - Decrease dose to 5mg  daily - Schedule follow-up in one month for response assessment and blood work.   Other orders -     Venlafaxine  HCl ER; One capsule po qd  Dispense: 30 capsule; Refill: 2 -     Fluconazole ; po today, repeat in 48 hours if needed.  Dispense: 2 tablet; Refill: 0     Return in 4 weeks (on 09/29/2023), or f/u farxiga  5, dm check.  Patient was given opportunity to ask questions. Patient verbalized understanding of the plan and was able to repeat key elements of the plan. All questions were answered to their satisfaction.   I, Smiley Dung, MD, have reviewed all documentation for this visit. The documentation on 09/01/23 for the exam, diagnosis, procedures, and orders are all accurate and complete.  Assessment and Plan   IF YOU HAVE BEEN REFERRED TO A SPECIALIST, IT MAY TAKE 1-2 WEEKS TO SCHEDULE/PROCESS THE REFERRAL. IF YOU HAVE NOT HEARD FROM US /SPECIALIST IN TWO WEEKS, PLEASE GIVE US  A CALL AT [phone].   THE PATIENT IS ENCOURAGED TO PRACTICE SOCIAL DISTANCING DUE TO THE COVID-19 PANDEMIC.

## 2023-09-01 NOTE — Assessment & Plan Note (Signed)
 She is encouraged to gradually increase her daily activity level. Advised to aim for at least 150 minutes of exercise per week.

## 2023-09-02 ENCOUNTER — Encounter: Payer: Self-pay | Admitting: Internal Medicine

## 2023-09-08 ENCOUNTER — Other Ambulatory Visit: Payer: Self-pay | Admitting: Internal Medicine

## 2023-09-08 DIAGNOSIS — E785 Hyperlipidemia, unspecified: Secondary | ICD-10-CM

## 2023-09-09 ENCOUNTER — Other Ambulatory Visit: Payer: Self-pay | Admitting: Internal Medicine

## 2023-09-15 ENCOUNTER — Ambulatory Visit: Payer: BC Managed Care – PPO | Admitting: Internal Medicine

## 2023-09-22 ENCOUNTER — Other Ambulatory Visit: Payer: Self-pay | Admitting: Internal Medicine

## 2023-09-25 ENCOUNTER — Other Ambulatory Visit: Payer: Self-pay | Admitting: Nurse Practitioner

## 2023-09-25 ENCOUNTER — Other Ambulatory Visit: Payer: Self-pay | Admitting: Internal Medicine

## 2023-09-25 DIAGNOSIS — H9203 Otalgia, bilateral: Secondary | ICD-10-CM

## 2023-09-25 DIAGNOSIS — N951 Menopausal and female climacteric states: Secondary | ICD-10-CM

## 2023-09-29 ENCOUNTER — Ambulatory Visit: Attending: Internal Medicine | Admitting: Audiologist

## 2023-09-29 DIAGNOSIS — H9201 Otalgia, right ear: Secondary | ICD-10-CM | POA: Diagnosis present

## 2023-09-29 DIAGNOSIS — H9312 Tinnitus, left ear: Secondary | ICD-10-CM | POA: Diagnosis present

## 2023-09-29 NOTE — Procedures (Signed)
  Outpatient Audiology and [facility] [address] [place], [place]  [address] [phone]  AUDIOLOGICAL  EVALUATION  NAME: Crystal Escobar     DOB:   1970/06/28      MRN: [id number]                                                                                     DATE: 09/29/2023     REFERENT: Cleave Curling, MD STATUS: Outpatient DIAGNOSIS: Normal Hearing, Tinnitus, Otalgia Right Ear    History: [name] was seen for an audiological evaluation due to itching and pain in the right ear. In the left ear she is having occasional ringing. She feels the pain and itching is due to her allergies. She feels she hears well.  [name] no history of hazardous noise exposure.  Medical history shows no risk for hearing loss.    Evaluation:  Otoscopy showed a clear view of the tympanic membranes, bilaterally Tympanometry results were consistent with normal middle ear function, bilaterally   Audiometric testing was completed using Conventional Audiometry techniques with insert earphones and supraural headphones. Test results are consistent with normal hearing 250-8kHz bilaterally. Speech Recognition Thresholds were obtained at 15dB HL in the right ear and at 10dB HL in the left ear. Word Recognition Testing was completed at  40dB SL and [name] scored 100% in each ear.    Results:  The test results were reviewed with [name]. [name] has perfect hearing. The skin of both her ear canals look red. Otherwise all results today in normal range. No indication of middle or inner ear pathology.   Recommendations: No further testing is recommended at this time. Refer to Otolaryngology if unilateral tinnitus is  not managed by allergy treatment.  21 minutes spent testing and counseling on results.   If you have any questions please feel free to contact me at [phone].  [name]  Audiologist   09/29/2023  1:18 PM  Cc: Cleave Curling, MD

## 2023-10-06 ENCOUNTER — Ambulatory Visit: Admitting: Internal Medicine

## 2023-10-06 ENCOUNTER — Encounter: Payer: Self-pay | Admitting: Internal Medicine

## 2023-10-06 VITALS — BP 110/72 | HR 97 | Temp 97.7°F | Ht 63.0 in | Wt 340.4 lb

## 2023-10-06 DIAGNOSIS — E785 Hyperlipidemia, unspecified: Secondary | ICD-10-CM

## 2023-10-06 DIAGNOSIS — F4323 Adjustment disorder with mixed anxiety and depressed mood: Secondary | ICD-10-CM

## 2023-10-06 DIAGNOSIS — I10 Essential (primary) hypertension: Secondary | ICD-10-CM | POA: Diagnosis not present

## 2023-10-06 DIAGNOSIS — E1169 Type 2 diabetes mellitus with other specified complication: Secondary | ICD-10-CM

## 2023-10-06 DIAGNOSIS — H9201 Otalgia, right ear: Secondary | ICD-10-CM | POA: Diagnosis not present

## 2023-10-06 DIAGNOSIS — F5104 Psychophysiologic insomnia: Secondary | ICD-10-CM

## 2023-10-06 MED ORDER — DAPAGLIFLOZIN PROPANEDIOL 5 MG PO TABS: 5.0000 mg | ORAL_TABLET | Freq: Every day | ORAL | 5 refills | Status: AC

## 2023-10-06 NOTE — Progress Notes (Signed)
 LILLETTE Kristeen JINNY Gladis, CMA,acting as a scribe for Crystal LOISE Slocumb, MD.,have documented all relevant documentation on the behalf of Crystal LOISE Slocumb, MD,as directed by  Crystal LOISE Slocumb, MD while in the presence of Crystal LOISE Slocumb, MD.  Subjective:  Patient ID: Crystal Escobar , female    DOB: 16-Oct-1970 , 53 y.o.   MRN: [id number]  Chief Complaint  Patient presents with   Hypertension    Pt presents today for a DM check & Medication check. Patient doesn't have any specific questions or concerns. Patient denies headaches, chest pain & sob.    Diabetes    HPI Discussed the use of AI scribe software for clinical note transcription with the patient, who gave verbal consent to proceed.  History of Present Illness Crystal Escobar is a 53 year old female with diabetes who presents for a follow-up on Farxiga  and ear pain.  She is following up on her diabetes management, specifically regarding the initiation of Farxiga . She experiences fewer side effects compared to previous treatments, although she previously had frequent yeast infections with Farxiga , which were uncomfortable. She is also on Effexor  for mood, which she tolerates well after a dose increase.  She has ongoing right ear pain, described as the most painful, while the left ear feels itchy. An audiologist found no signs of infection and noted her hearing was perfect. She uses Zyrtec  as needed for her symptoms, which she took this morning without drowsiness. The ear pain persists and she associates it with sinus issues.  Regarding sleep, she is prescribed Ambien but it does not help her sleep through the night and causes vivid dreams. She wakes up around 3 AM despite taking the full dose. She has a history of sleep apnea and struggles with CPAP machine use due to discomfort with the mask, having tried different masks without success.  In her social history, she engages in regular physical activity, working out three to five days a week for about 20  minutes each session, motivated by a Radio broadcast assistant.   Diabetes She presents for her follow-up diabetic visit. She has type 2 diabetes mellitus. There are no hypoglycemic associated symptoms. There are no diabetic associated symptoms. Pertinent negatives for diabetes include no blurred vision. There are no hypoglycemic complications. Risk factors for coronary artery disease include diabetes mellitus, dyslipidemia, hypertension, obesity and sedentary lifestyle. When asked about current treatments, none were reported. She is compliant with treatment some of the time. She is following a generally healthy diet. She participates in exercise intermittently. An ACE inhibitor/angiotensin II receptor blocker is being taken.  Hypertension This is a chronic problem. The current episode started more than 1 year ago. The problem has been gradually improving since onset. The problem is uncontrolled. Pertinent negatives include no blurred vision. Risk factors for coronary artery disease include obesity, sedentary lifestyle and diabetes mellitus. Past treatments include angiotensin blockers. The current treatment provides moderate improvement.     Past Medical History:  Diagnosis Date   Abnormal Pap smear 2011   Diabetes mellitus    DUB (dysfunctional uterine bleeding) 12/22/2012   HLD (hyperlipidemia)    Hypertension    Sleep apnea    does not use machine as prescribed     Family History  Problem Relation Age of Onset   Heart attack Mother    Hypertension Mother    Diabetes Mother    Seizures Father    Hypertension Sister    Sickle cell anemia Maternal Aunt    Hypertension  Maternal Aunt    Lung cancer Maternal Grandmother    Heart murmur Son    Colon cancer Neg Hx      Current Outpatient Medications:    atorvastatin  (LIPITOR) 20 MG tablet, Take 1 tablet by mouth once daily, Disp: 90 tablet, Rfl: 2   CALCIUM  PO, Take by mouth daily., Disp: , Rfl:    cetirizine  (ZYRTEC ) 10 MG tablet, TAKE 1 TABLET BY  MOUTH ONCE DAILY AS NEEDED FOR ALLERGIES, Disp: 30 tablet, Rfl: 0   Cholecalciferol (VITAMIN D3) 125 MCG (5000 UT) CAPS, Take 5,000 Units by mouth daily., Disp: , Rfl:    dapagliflozin  propanediol (FARXIGA ) 5 MG TABS tablet, Take 1 tablet (5 mg total) by mouth daily before breakfast., Disp: 30 tablet, Rfl: 5   fluconazole  (DIFLUCAN ) 150 MG tablet, po today, repeat in 48 hours if needed., Disp: 2 tablet, Rfl: 0   hydrochlorothiazide  (HYDRODIURIL ) 12.5 MG tablet, Take 1 tablet (12.5 mg total) by mouth daily., Disp: 90 tablet, Rfl: 1   Multiple Vitamin (MULTIVITAMIN) capsule, Take 1 capsule by mouth daily., Disp: , Rfl:    TRULICITY  3 MG/0.5ML SOAJ, INJECT 3 MG SUBCUATNOUSLY ONCE A WEEK, Disp: 4 mL, Rfl: 0   valsartan  (DIOVAN ) 80 MG tablet, Take 1 tablet by mouth once daily, Disp: 90 tablet, Rfl: 2   venlafaxine  XR (EFFEXOR  XR) 75 MG 24 hr capsule, One capsule po qd, Disp: 30 capsule, Rfl: 2   amoxicillin -clavulanate (AUGMENTIN ) 875-125 MG tablet, Take 1 tablet by mouth 2 (two) times daily., Disp: 20 tablet, Rfl: 0   eszopiclone  3 MG TABS, Take 1 tablet (3 mg total) by mouth at bedtime as needed. Take immediately before bedtime, Disp: 30 tablet, Rfl: 0   Probiotic Product (PROBIOTIC PO), Take 1 capsule by mouth daily. (Patient not taking: Reported on 10/06/2023), Disp: , Rfl:    No Known Allergies   Review of Systems  Constitutional: Negative.   HENT:  Positive for ear pain.   Eyes:  Negative for blurred vision.  Respiratory: Negative.    Cardiovascular: Negative.   Gastrointestinal: Negative.   Neurological: Negative.   Psychiatric/Behavioral:  Positive for sleep disturbance.      Today's Vitals   10/06/23 1517  BP: 110/72  Pulse: 97  Temp: 97.7 F (36.5 C)  SpO2: 98%  Weight: (!) 340 lb 6.4 oz (154.4 kg)  Height: 5' 3 (1.6 m)   Body mass index is 60.3 kg/m.  Wt Readings from Last 3 Encounters:  10/06/23 (!) 340 lb 6.4 oz (154.4 kg)  09/01/23 (!) 335 lb 6.4 oz (152.1 kg)   07/21/23 (!) 333 lb 3.2 oz (151.1 kg)    The 10-year ASCVD risk score (Arnett DK, et al., 2019) is: 3.8%   Values used to calculate the score:     Age: 68 years     Clincally relevant sex: Female     Is Non-Hispanic African American: Yes     Diabetic: Yes     Tobacco smoker: No     Systolic Blood Pressure: 110 mmHg     Is BP treated: Yes     HDL Cholesterol: 53 mg/dL     Total Cholesterol: 162 mg/dL  Objective:  Physical Exam Vitals and nursing note reviewed.  Constitutional:      Appearance: Normal appearance. She is obese.  HENT:     Head: Normocephalic and atraumatic.     Right Ear: Tympanic membrane, ear canal and external ear normal. There is no impacted cerumen.  Left Ear: Tympanic membrane, ear canal and external ear normal. There is no impacted cerumen.   Eyes:     Extraocular Movements: Extraocular movements intact.    Cardiovascular:     Rate and Rhythm: Normal rate and regular rhythm.     Heart sounds: Normal heart sounds.  Pulmonary:     Effort: Pulmonary effort is normal.     Breath sounds: Normal breath sounds.   Musculoskeletal:     Cervical back: Normal range of motion.   Skin:    General: Skin is warm.   Neurological:     General: No focal deficit present.     Mental Status: She is alert.   Psychiatric:        Mood and Affect: Mood normal.        Behavior: Behavior normal.         Assessment And Plan:  Essential hypertension, benign Assessment & Plan: Chronic, controlled. She will c/w valsartan  80mg  daily. I will add hydrochlorothiazide  12.5mg  daily.  She is advised to follow low sodium diet.   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  On Farxiga , 10 mg dose causes yeast infections. A1c and kidney function tests planned. Prefers [facility] for cost efficiency. - Order A1c and kidney function tests. - She is willing to try lower dose of Farxiga , 5mg   daily. - Provide Farxiga  samples. - [facility] to [facility].  Orders: -     BMP8+eGFR -     Hemoglobin A1c -     Lipid panel -     ALT  Right ear pain Assessment & Plan: Right ear pain with fluid, no infection. Likely due to allergies or sinus issues. - Provide Neoral with antihistamine and decongestant samples. - Advise Norel AD at breakfast and dinner. - Instruct to discontinue Zyrtec  while on Neoral. - Advise to message if symptoms persist for antibiotic.   Chronic insomnia Assessment & Plan: Sleep disturbances with Ambien causing vivid dreams. Has sleep apnea, intolerant to CPAP mask. - Discuss alternative sleep aids. - Consider different CPAP mask options.   Other orders -     Dapagliflozin  Propanediol; Take 1 tablet (5 mg total) by mouth daily before breakfast.  Dispense: 30 tablet; Refill: 5   Return if symptoms worsen or fail to improve.  Patient was given opportunity to ask questions. Patient verbalized understanding of the plan and was able to repeat key elements of the plan. All questions were answered to their satisfaction.    I, Crystal LOISE Slocumb, MD, have reviewed all documentation for this visit. The documentation on 10/14/23 for the exam, diagnosis, procedures, and orders are all accurate and complete.   IF YOU HAVE BEEN REFERRED TO A SPECIALIST, IT MAY TAKE 1-2 WEEKS TO SCHEDULE/PROCESS THE REFERRAL. IF YOU HAVE NOT HEARD FROM US /SPECIALIST IN TWO WEEKS, PLEASE GIVE US  A CALL AT [phone].

## 2023-10-07 LAB — HEMOGLOBIN A1C
Est. average glucose Bld gHb Est-mCnc: 131 mg/dL
Hgb A1c MFr Bld: 6.2 % — ABNORMAL HIGH (ref 4.8–5.6)

## 2023-10-07 LAB — BMP8+EGFR
BUN/Creatinine Ratio: 7 — ABNORMAL LOW (ref 9–23)
BUN: 5 mg/dL — ABNORMAL LOW (ref 6–24)
CO2: 21 mmol/L (ref 20–29)
Calcium: 9.2 mg/dL (ref 8.7–10.2)
Chloride: 103 mmol/L (ref 96–106)
Creatinine, Ser: 0.69 mg/dL (ref 0.57–1.00)
Glucose: 89 mg/dL (ref 70–99)
Potassium: 3.9 mmol/L (ref 3.5–5.2)
Sodium: 142 mmol/L (ref 134–144)
eGFR: 106 mL/min/{1.73_m2} (ref >59)

## 2023-10-07 LAB — LIPID PANEL
Chol/HDL Ratio: 3.1 ratio (ref 0.0–4.4)
Cholesterol, Total: 162 mg/dL (ref 100–199)
HDL: 53 mg/dL (ref >39)
LDL Chol Calc (NIH): 86 mg/dL (ref 0–99)
Triglycerides: 132 mg/dL (ref 0–149)
VLDL Cholesterol Cal: 23 mg/dL (ref 5–40)

## 2023-10-07 LAB — ALT: ALT: 18 IU/L (ref 0–32)

## 2023-10-09 ENCOUNTER — Other Ambulatory Visit: Payer: Self-pay | Admitting: Internal Medicine

## 2023-10-09 DIAGNOSIS — E785 Hyperlipidemia, unspecified: Secondary | ICD-10-CM

## 2023-10-10 ENCOUNTER — Ambulatory Visit: Payer: Self-pay | Admitting: Internal Medicine

## 2023-10-13 LAB — HM DIABETES EYE EXAM

## 2023-10-13 LAB — OPHTHALMOLOGY REPORT-SCANNED

## 2023-10-14 ENCOUNTER — Other Ambulatory Visit: Payer: Self-pay | Admitting: Internal Medicine

## 2023-10-14 MED ORDER — AMOXICILLIN-POT CLAVULANATE 875-125 MG PO TABS
1.0000 | ORAL_TABLET | Freq: Two times a day (BID) | ORAL | 0 refills | Status: DC
Start: 2023-10-14 — End: 2024-02-23

## 2023-10-15 ENCOUNTER — Other Ambulatory Visit: Payer: Self-pay | Admitting: Internal Medicine

## 2023-10-15 MED ORDER — ESZOPICLONE 3 MG PO TABS: 3.0000 mg | ORAL_TABLET | Freq: Every evening | ORAL | 0 refills | Status: DC | PRN

## 2023-10-16 DIAGNOSIS — F5104 Psychophysiologic insomnia: Secondary | ICD-10-CM | POA: Insufficient documentation

## 2023-10-16 NOTE — Assessment & Plan Note (Signed)
 Chronic, this has been stable.  On Farxiga , 10 mg dose causes yeast infections. A1c and kidney function tests planned. [facility]ers [facility] cost efficiency. - Order A1c and kidney function tests. - She is willing to try lower dose of Farxiga , 5mg  daily. - Provide Farxiga  samples. - Send prescription to [facility].

## 2023-10-16 NOTE — Assessment & Plan Note (Signed)
 Chronic, controlled. She will c/w valsartan  80mg  daily. I will add hydrochlorothiazide  12.5mg  daily.  She is advised to follow low sodium diet.

## 2023-10-16 NOTE — Assessment & Plan Note (Signed)
 Sleep disturbances with Ambien causing vivid dreams. Has sleep apnea, intolerant to CPAP mask. - Discuss alternative sleep aids. - Consider different CPAP mask options.

## 2023-10-16 NOTE — Assessment & Plan Note (Signed)
 Right ear pain with fluid, no infection. Likely due to allergies or sinus issues. - Provide Neoral with antihistamine and decongestant samples. - Advise Norel AD at breakfast and dinner. - Instruct to discontinue Zyrtec  while on Neoral. - Advise to message if symptoms persist for antibiotic.

## 2023-10-20 LAB — HM MAMMOGRAPHY

## 2023-10-22 ENCOUNTER — Other Ambulatory Visit: Payer: Self-pay

## 2023-10-22 MED ORDER — NOREL AD 4-10-325 MG PO TABS: ORAL_TABLET | ORAL | 0 refills | Status: DC

## 2023-10-25 LAB — HM PAP SMEAR: HPV, high-risk: NEGATIVE

## 2023-11-08 ENCOUNTER — Other Ambulatory Visit: Payer: Self-pay | Admitting: Internal Medicine

## 2023-11-30 ENCOUNTER — Other Ambulatory Visit: Payer: Self-pay | Admitting: Internal Medicine

## 2023-12-05 ENCOUNTER — Other Ambulatory Visit: Payer: Self-pay | Admitting: Internal Medicine

## 2023-12-29 ENCOUNTER — Other Ambulatory Visit: Payer: Self-pay | Admitting: Internal Medicine

## 2024-01-10 ENCOUNTER — Other Ambulatory Visit: Payer: Self-pay | Admitting: Nurse Practitioner

## 2024-01-10 DIAGNOSIS — H9203 Otalgia, bilateral: Secondary | ICD-10-CM

## 2024-01-13 ENCOUNTER — Encounter: Payer: BC Managed Care – PPO | Admitting: Internal Medicine

## 2024-01-24 ENCOUNTER — Other Ambulatory Visit: Payer: Self-pay | Admitting: Internal Medicine

## 2024-02-10 ENCOUNTER — Other Ambulatory Visit: Payer: Self-pay | Admitting: Nurse Practitioner

## 2024-02-10 DIAGNOSIS — H9203 Otalgia, bilateral: Secondary | ICD-10-CM

## 2024-02-16 ENCOUNTER — Encounter: Admitting: Internal Medicine

## 2024-02-23 ENCOUNTER — Ambulatory Visit (INDEPENDENT_AMBULATORY_CARE_PROVIDER_SITE_OTHER): Admitting: Internal Medicine

## 2024-02-23 ENCOUNTER — Encounter: Payer: Self-pay | Admitting: Internal Medicine

## 2024-02-23 VITALS — BP 126/82 | HR 83 | Temp 98.3°F | Ht 63.0 in | Wt 342.4 lb

## 2024-02-23 DIAGNOSIS — Z23 Encounter for immunization: Secondary | ICD-10-CM

## 2024-02-23 DIAGNOSIS — I10 Essential (primary) hypertension: Secondary | ICD-10-CM

## 2024-02-23 DIAGNOSIS — Z0000 Encounter for general adult medical examination without abnormal findings: Secondary | ICD-10-CM

## 2024-02-23 DIAGNOSIS — M2550 Pain in unspecified joint: Secondary | ICD-10-CM

## 2024-02-23 DIAGNOSIS — Z6841 Body Mass Index (BMI) 40.0 and over, adult: Secondary | ICD-10-CM

## 2024-02-23 DIAGNOSIS — E66813 Obesity, class 3: Secondary | ICD-10-CM

## 2024-02-23 DIAGNOSIS — E785 Hyperlipidemia, unspecified: Secondary | ICD-10-CM | POA: Diagnosis not present

## 2024-02-23 DIAGNOSIS — R202 Paresthesia of skin: Secondary | ICD-10-CM

## 2024-02-23 DIAGNOSIS — F5104 Psychophysiologic insomnia: Secondary | ICD-10-CM | POA: Diagnosis not present

## 2024-02-23 DIAGNOSIS — E1169 Type 2 diabetes mellitus with other specified complication: Secondary | ICD-10-CM

## 2024-02-23 DIAGNOSIS — G5603 Carpal tunnel syndrome, bilateral upper limbs: Secondary | ICD-10-CM

## 2024-02-23 LAB — POCT URINALYSIS DIP (CLINITEK)
Bilirubin, UA: NEGATIVE
Blood, UA: NEGATIVE
Glucose, UA: NEGATIVE mg/dL — NL
Ketones, POC UA: NEGATIVE mg/dL
Leukocytes, UA: NEGATIVE
Nitrite, UA: NEGATIVE
POC PROTEIN,UA: NEGATIVE
Spec Grav, UA: 1.01 (ref 1.010–1.025)
Urobilinogen, UA: 0.2 U/dL
pH, UA: 5 (ref 5.0–8.0)

## 2024-02-23 MED ORDER — TRAMADOL HCL 50 MG PO TABS: 50.0000 mg | ORAL_TABLET | Freq: Four times a day (QID) | ORAL | 0 refills | Status: DC | PRN

## 2024-02-23 MED ORDER — ESZOPICLONE 3 MG PO TABS: 3.0000 mg | ORAL_TABLET | Freq: Every evening | ORAL | 0 refills | Status: DC | PRN

## 2024-02-23 MED ORDER — TRULICITY 4.5 MG/0.5ML ~~LOC~~ SOAJ: 4.5000 mg | SUBCUTANEOUS | 2 refills | Status: DC

## 2024-02-23 NOTE — Progress Notes (Signed)
 I,Victoria T Emmitt, CMA,acting as a neurosurgeon for Catheryn LOISE Slocumb, MD.,have documented all relevant documentation on the behalf of Catheryn LOISE Slocumb, MD,as directed by  Catheryn LOISE Slocumb, MD while in the presence of Catheryn LOISE Slocumb, MD.  Subjective:    Patient ID: Crystal Escobar , female    DOB: 1970-12-09 , 53 y.o.   MRN: [id number]  Chief Complaint  Patient presents with   Annual Exam    Patient presents today for HM, she is followed by Dr. Henry for her GYN care.  Patient states compliance with meds. She denies headaches, chest pain and shortness of breath.  Letter sent to GYN for letter & pap result.    Diabetes   Hypertension    HPI Discussed the use of AI scribe software for clinical note transcription with the patient, who gave verbal consent to proceed.  History of Present Illness KIYA ENO is a 53 year old female with diabetes who presents for a diabetes check and physical exam.  Her blood sugar levels have been stable with the current regimen of Trulicity  3 mg and Farxiga  5 mg. She also takes Atorvastatin  20 mg for cholesterol, Lunesta  3 mg as needed for sleep, hydrochlorothiazide  12.5 mg, valsartan  80 mg, and Effexor  150 mg. Her insurance limits the amount of Lunesta  she can receive every 90 days.  She experiences arthritis pain, particularly in her arms, and tendinitis. Carpal tunnel syndrome causes significant discomfort, especially in her left hand. A nerve conduction study conducted three years ago showed mild issues in the left hand. She uses braces on both hands during the day and night to manage symptoms. She experiences numbness in her hands, particularly the left.  She engages in physical activity, usually working out four nights a week, although she did not exercise last week due to a cruise trip. She tries to drink at least four bottles of water  a day and has regular bowel movements. She has not had a menstrual cycle since May.  She had a mammogram in June of this  year and a Pap smear and eye exam this year. Her eye exam was conducted virtually, which she found unusual, and she plans to retest her eye exam in the future.    Diabetes She presents for her follow-up diabetic visit. She has type 2 diabetes mellitus. There are no hypoglycemic associated symptoms. There are no diabetic associated symptoms. Pertinent negatives for diabetes include no blurred vision and no chest pain. There are no hypoglycemic complications. Risk factors for coronary artery disease include diabetes mellitus, dyslipidemia, hypertension, obesity and sedentary lifestyle. When asked about current treatments, none were reported. She is compliant with treatment some of the time. She is following a generally healthy diet. She participates in exercise intermittently. An ACE inhibitor/angiotensin II receptor blocker is being taken.  Hypertension This is a chronic problem. The current episode started more than 1 year ago. The problem has been gradually improving since onset. The problem is uncontrolled. Pertinent negatives include no blurred vision, chest pain, palpitations or shortness of breath.     Past Medical History:  Diagnosis Date   Abnormal Pap smear 2011   Diabetes mellitus    DUB (dysfunctional uterine bleeding) 12/22/2012   HLD (hyperlipidemia)    Hypertension    Sleep apnea    does not use machine as prescribed     Family History  Problem Relation Age of Onset   Heart attack Mother    Hypertension Mother    Diabetes  Mother    Seizures Father    Hypertension Sister    Sickle cell anemia Maternal Aunt    Hypertension Maternal Aunt    Lung cancer Maternal Grandmother    Heart murmur Son    Colon cancer Neg Hx      Current Outpatient Medications:    atorvastatin  (LIPITOR) 20 MG tablet, Take 1 tablet by mouth once daily, Disp: 90 tablet, Rfl: 2   CALCIUM  PO, Take by mouth daily., Disp: , Rfl:    cetirizine  (ZYRTEC ) 10 MG tablet, TAKE 1 TABLET BY MOUTH ONCE DAILY AS  NEEDED FOR ALLERGIES, Disp: 30 tablet, Rfl: 0   Cholecalciferol (VITAMIN D3) 125 MCG (5000 UT) CAPS, Take 5,000 Units by mouth daily., Disp: , Rfl:    dapagliflozin  propanediol (FARXIGA ) 5 MG TABS tablet, Take 1 tablet (5 mg total) by mouth daily before breakfast., Disp: 30 tablet, Rfl: 5   Dulaglutide  (TRULICITY ) 4.5 MG/0.5ML SOAJ, Inject 4.5 mg as directed once a week., Disp: 2 mL, Rfl: 2   hydrochlorothiazide  (HYDRODIURIL ) 12.5 MG tablet, TAKE 1 TABLET BY MOUTH EVERY DAY, Disp: 90 tablet, Rfl: 1   Multiple Vitamin (MULTIVITAMIN) capsule, Take 1 capsule by mouth daily., Disp: , Rfl:    traMADol  (ULTRAM ) 50 MG tablet, Take 1 tablet (50 mg total) by mouth every 6 (six) hours as needed., Disp: 20 tablet, Rfl: 0   venlafaxine  XR (EFFEXOR  XR) 75 MG 24 hr capsule, One capsule po qd (Patient taking differently: Take 150 mg by mouth daily. One capsule po qd), Disp: 30 capsule, Rfl: 2   Chlorphen-PE-Acetaminophen (NOREL AD) 4-10-325 MG TABS, Take one tab po twice daily as needed., Disp: 20 tablet, Rfl: 0   eszopiclone  3 MG TABS, Take 1 tablet (3 mg total) by mouth at bedtime as needed. Take immediately before bedtime, Disp: 30 tablet, Rfl: 0   fluconazole  (DIFLUCAN ) 150 MG tablet, po today, repeat in 48 hours if needed. (Patient not taking: Reported on 02/23/2024), Disp: 2 tablet, Rfl: 0   Probiotic Product (PROBIOTIC PO), Take 1 capsule by mouth daily. (Patient not taking: Reported on 02/23/2024), Disp: , Rfl:    valsartan  (DIOVAN ) 80 MG tablet, Take 1 tablet by mouth once daily, Disp: 90 tablet, Rfl: 0   No Known Allergies    The patient states she uses none for birth control. Patient's last menstrual period was 09/01/2023 (exact date).. Negative for Dysmenorrhea. Negative for: breast discharge, breast lump(s), breast pain and breast self exam. Associated symptoms include abnormal vaginal bleeding. Pertinent negatives include abnormal bleeding (hematology), anxiety, decreased libido, depression,  difficulty falling sleep, dyspareunia, history of infertility, nocturia, sexual dysfunction, sleep disturbances, urinary incontinence, urinary urgency, vaginal discharge and vaginal itching. Diet regular.The patient states her exercise level is  moderate.  . The patient's tobacco use is:  Social History   Tobacco Use  Smoking Status Former   Current packs/day: 0.00   Average packs/day: 1 pack/day for 10.0 years (10.0 ttl pk-yrs)   Types: Cigars, Cigarettes   Start date: 06/03/2008   Quit date: 06/03/2018   Years since quitting: 5.7   Passive exposure: Never  Smokeless Tobacco Never  . She has been exposed to passive smoke. The patient's alcohol use is:  Social History   Substance and Sexual Activity  Alcohol Use Yes   Comment: occasionally     Review of Systems  Constitutional: Negative.   HENT: Negative.    Eyes: Negative.  Negative for blurred vision.  Respiratory: Negative.  Negative for shortness of breath.  Cardiovascular: Negative.  Negative for chest pain and palpitations.  Gastrointestinal: Negative.   Endocrine: Negative.   Genitourinary: Negative.   Musculoskeletal: Negative.   Skin: Negative.   Allergic/Immunologic: Negative.   Neurological: Negative.   Hematological: Negative.   Psychiatric/Behavioral: Negative.       Today's Vitals   02/23/24 1528  BP: 126/82  Pulse: 83  Temp: 98.3 F (36.8 C)  SpO2: 98%  Weight: (!) 342 lb 6.4 oz (155.3 kg)  Height: 5' 3 (1.6 m)   Body mass index is 60.65 kg/m.  Wt Readings from Last 3 Encounters:  02/23/24 (!) 342 lb 6.4 oz (155.3 kg)  10/06/23 (!) 340 lb 6.4 oz (154.4 kg)  09/01/23 (!) 335 lb 6.4 oz (152.1 kg)     Objective:  Physical Exam Vitals and nursing note reviewed.  Constitutional:      Appearance: Normal appearance. She is obese.  HENT:     Head: Normocephalic and atraumatic.     Right Ear: Tympanic membrane, ear canal and external ear normal.     Left Ear: Tympanic membrane, ear canal and  external ear normal.     Nose: Nose normal.     Mouth/Throat:     Mouth: Mucous membranes are moist.     Pharynx: Oropharynx is clear.  Eyes:     Extraocular Movements: Extraocular movements intact.     Conjunctiva/sclera: Conjunctivae normal.     Pupils: Pupils are equal, round, and reactive to light.  Cardiovascular:     Rate and Rhythm: Normal rate and regular rhythm.     Pulses: Normal pulses.          Dorsalis pedis pulses are 2+ on the right side and 2+ on the left side.     Heart sounds: Normal heart sounds.  Pulmonary:     Effort: Pulmonary effort is normal.     Breath sounds: Normal breath sounds.  Chest:  Breasts:    Tanner Score is 5.     Right: Normal.     Left: Normal.  Abdominal:     General: Bowel sounds are normal.     Palpations: Abdomen is soft.     Comments: Obese, soft. Difficult to assess organomegaly due to body habitus  Genitourinary:    Comments: deferred Musculoskeletal:        General: Normal range of motion.     Cervical back: Normal range of motion and neck supple.  Feet:     Right foot:     Protective Sensation: 5 sites tested.  5 sites sensed.     Skin integrity: Skin integrity normal.     Toenail Condition: Right toenails are normal.     Left foot:     Protective Sensation: 5 sites tested.  5 sites sensed.     Skin integrity: Skin integrity normal.     Toenail Condition: Left toenails are normal.  Skin:    General: Skin is warm and dry.  Neurological:     General: No focal deficit present.     Mental Status: She is alert and oriented to person, place, and time.  Psychiatric:        Mood and Affect: Mood normal.        Behavior: Behavior normal.         Assessment And Plan:     Annual physical exam Assessment & Plan: A full exam was performed.  Importance of monthly self breast exams was discussed with the patient.  She is advised to  get 30-45 minutes of regular exercise, no less than four to five days per week. Both weight-bearing  and aerobic exercises are recommended.  She is advised to follow a healthy diet with at least six fruits/veggies per day, decrease intake of red meat and other saturated fats and to increase fish intake to twice weekly.  Meats/fish should not be fried -- baked, boiled or broiled is preferable. It is also important to cut back on your sugar intake.  Be sure to read labels - try to avoid anything with added sugar, high fructose corn syrup or other sweeteners.  If you must use a sweetener, you can try stevia or monkfruit.  It is also important to avoid artificially sweetened foods/beverages and diet drinks. Lastly, wear SPF 50 sunscreen on exposed skin and when in direct sunlight for an extended period of time.  Be sure to avoid fast food restaurants and aim for at least 60 ounces of water  daily.      Orders: -     CBC -     CMP14+EGFR -     Hemoglobin A1c  Dyslipidemia due to type 2 diabetes mellitus (HCC) Assessment & Plan: Chronic, diabetic foot exam was performed.  Chronic, this has been stable.  Current medication regimen includes Trulicity  3 mg, with a plan to increase to the highest dose for improved glycemic control. - Increase Trulicity  to the highest dose, 4.5mg  weekly.     Essential hypertension, benign Assessment & Plan: Chronic, controlled.  EKG performed, NSR w/o acute changes.  She will c/w valsartan  80mg  daily and hydrochlorothiazide  12.5mg  daily.  She is advised to follow low sodium diet.   Orders: -     EKG 12-Lead -     Microalbumin / creatinine urine ratio -     POCT URINALYSIS DIP (CLINITEK)  Chronic insomnia Assessment & Plan: Insomnia managed with Lunesta  3 mg as needed. Reports insurance limitations on medication supply. - Refill Lunesta  3 mg. - Develop bedtime routine.    Bilateral carpal tunnel syndrome Assessment & Plan: Bilateral carpal tunnel syndrome, worse on the left, with worsening symptoms. Last NCV was in 2022.   - Refer to hand specialist after nerve  conduction study. - I will consider updated NCV.  - Wear wrist splints at night.  Orders: -     TSH -     Vitamin B12 -     Ambulatory referral to Neurology  Polyarthralgia Assessment & Plan: Osteoarthritis with pain in the arms and tendinitis affecting daily activities. Discussed potential use of tramadol  for pain management. - Consider tramadol  for pain management.   Class 3 severe obesity due to excess calories with serious comorbidity and body mass index (BMI) of 60.0 to 69.9 in adult [facility]) Assessment & Plan: We will be increasing dose of Trulicity  to 4.5mg  weekly. We are hopeful augments her weight loss efforts.  - Prioritize protein intake - Aim for at least 150 minutes of exercise per week.  - Incorporate more strength training into her workout routine.   Immunization due -     Flu vaccine trivalent PF, 6mos and older(Flulaval,Afluria,Fluarix,Fluzone)  Other orders -     Eszopiclone ; Take 1 tablet (3 mg total) by mouth at bedtime as needed. Take immediately before bedtime  Dispense: 30 tablet; Refill: 0 -     Trulicity ; Inject 4.5 mg as directed once a week.  Dispense: 2 mL; Refill: 2 -     traMADol  HCl; Take 1 tablet (50 mg total) by mouth every  6 (six) hours as needed.  Dispense: 20 tablet; Refill: 0    Return for 1 YEAR HM, 4 MONTH DM F/U.SABRA Patient was given opportunity to ask questions. Patient verbalized understanding of the plan and was able to repeat key elements of the plan. All questions were answered to their satisfaction.   I, Catheryn LOISE Slocumb, MD, have reviewed all documentation for this visit. The documentation on 02/23/24 for the exam, diagnosis, procedures, and orders are all accurate and complete.

## 2024-02-23 NOTE — Patient Instructions (Addendum)
[employer]  Health Maintenance, Female Adopting a healthy lifestyle and getting preventive care are important in promoting health and wellness. Ask your health care provider about: The right schedule for you to have regular tests and exams. Things you can do on your own to prevent diseases and keep yourself healthy. What should I know about diet, weight, and exercise? Eat a healthy diet  Eat a diet that includes plenty of vegetables, fruits, low-fat dairy products, and lean protein. Do not eat a lot of foods that are high in solid fats, added sugars, or sodium. Maintain a healthy weight Body mass index (BMI) is used to identify weight problems. It estimates body fat based on height and weight. Your health care provider can help determine your BMI and help you achieve or maintain a healthy weight. Get regular exercise Get regular exercise. This is one of the most important things you can do for your health. Most adults should: Exercise for at least 150 minutes each week. The exercise should increase your heart rate and make you sweat (moderate-intensity exercise). Do strengthening exercises at least twice a week. This is in addition to the moderate-intensity exercise. Spend less time sitting. Even light physical activity can be beneficial. Watch cholesterol and blood lipids Have your blood tested for lipids and cholesterol at 51 years of age, then have this test every 5 years. Have your cholesterol levels checked more often if: Your lipid or cholesterol levels are high. You are older than 51 years of age. You are at high risk for heart disease. What should I know about cancer screening? Depending on your health history and family history, you may need to have cancer screening at various ages. This may include screening for: Breast cancer. Cervical cancer. Colorectal cancer. Skin cancer. Lung cancer. What should I know about heart disease, diabetes, and high blood pressure? Blood  pressure and heart disease High blood pressure causes heart disease and increases the risk of stroke. This is more likely to develop in people who have high blood pressure readings or are overweight. Have your blood pressure checked: Every 3-5 years if you are 51-51 years of age. Every year if you are 51 years old or older. Diabetes Have regular diabetes screenings. This checks your fasting blood sugar level. Have the screening done: Once every three years after age 71 if you are at a normal weight and have a low risk for diabetes. More often and at a younger age if you are overweight or have a high risk for diabetes. What should I know about preventing infection? Hepatitis B If you have a higher risk for hepatitis B, you should be screened for this virus. Talk with your health care provider to find out if you are at risk for hepatitis B infection. Hepatitis C Testing is recommended for: Everyone born from [birth year] through 1963. Anyone with known risk factors for hepatitis C. Sexually transmitted infections (STIs) Get screened for STIs, including gonorrhea and chlamydia, if: You are sexually active and are younger than 51 years of age. You are older than 51 years of age and your health care provider tells you that you are at risk for this type of infection. Your sexual activity has changed since you were last screened, and you are at increased risk for chlamydia or gonorrhea. Ask your health care provider if you are at risk. Ask your health care provider about whether you are at high risk for HIV. Your health care provider may recommend a prescription medicine to  help prevent HIV infection. If you choose to take medicine to prevent HIV, you should first get tested for HIV. You should then be tested every 3 months for as long as you are taking the medicine. Pregnancy If you are about to stop having your period (premenopausal) and you may become pregnant, seek counseling before you get  pregnant. Take 400 to 800 micrograms (mcg) of folic acid every day if you become pregnant. Ask for birth control (contraception) if you want to prevent pregnancy. Osteoporosis and menopause Osteoporosis is a disease in which the bones lose minerals and strength with aging. This can result in bone fractures. If you are 51 years old or older, or if you are at risk for osteoporosis and fractures, ask your health care provider if you should: Be screened for bone loss. Take a calcium or vitamin D supplement to lower your risk of fractures. Be given hormone replacement therapy (HRT) to treat symptoms of menopause. Follow these instructions at home: Alcohol use Do not drink alcohol if: Your health care provider tells you not to drink. You are pregnant, may be pregnant, or are planning to become pregnant. If you drink alcohol: Limit how much you have to: 0-1 drink a day. Know how much alcohol is in your drink. In the [place] one drink equals one 12 oz bottle of beer (355 mL), one 5 oz glass of wine (148 mL), or one 1 oz glass of hard liquor (44 mL). Lifestyle Do not use any products that contain nicotine or tobacco. These products include cigarettes, chewing tobacco, and vaping devices, such as e-cigarettes. If you need help quitting, ask your health care provider. Do not use street drugs. Do not share needles. Ask your health care provider for help if you need support or information about quitting drugs. General instructions Schedule regular health, dental, and eye exams. Stay current with your vaccines. Tell your health care provider if: You often feel depressed. You have ever been abused or do not feel safe at home. Summary Adopting a healthy lifestyle and getting preventive care are important in promoting health and wellness. Follow your health care provider's instructions about healthy diet, exercising, and getting tested or screened for diseases. Follow your health care provider's  instructions on monitoring your cholesterol and blood pressure. This information is not intended to replace advice given to you by your health care provider. Make sure you discuss any questions you have with your health care provider. Document Revised: 09/02/2020 Document Reviewed: 09/02/2020 Elsevier Patient Education  2024 [employer].

## 2024-02-24 LAB — CBC
Hematocrit: 41.5 % (ref 34.0–46.6)
Hemoglobin: 13.3 g/dL (ref 11.1–15.9)
MCH: 29 pg (ref 26.6–33.0)
MCHC: 32 g/dL (ref 31.5–35.7)
MCV: 90 fL (ref 79–97)
Platelets: 328 x10E3/uL (ref 150–450)
RBC: 4.59 x10E6/uL (ref 3.77–5.28)
RDW: 14.6 % (ref 11.7–15.4)
WBC: 6.4 x10E3/uL (ref 3.4–10.8)

## 2024-02-24 LAB — CMP14+EGFR
ALT: 42 IU/L — ABNORMAL HIGH (ref 0–32)
AST: 37 IU/L (ref 0–40)
Albumin: 4 g/dL (ref 3.9–4.9)
Alkaline Phosphatase: 139 IU/L — ABNORMAL HIGH (ref 41–116)
BUN/Creatinine Ratio: 9 (ref 9–23)
BUN: 6 mg/dL (ref 6–24)
Bilirubin Total: 0.3 mg/dL (ref 0.0–1.2)
CO2: 25 mmol/L (ref 20–29)
Calcium: 9.3 mg/dL (ref 8.7–10.2)
Chloride: 102 mmol/L (ref 96–106)
Creatinine, Ser: 0.66 mg/dL (ref 0.57–1.00)
Globulin, Total: 3.2 g/dL (ref 1.5–4.5)
Glucose: 88 mg/dL (ref 70–99)
Potassium: 4.5 mmol/L (ref 3.5–5.2)
Sodium: 140 mmol/L (ref 134–144)
Total Protein: 7.2 g/dL (ref 6.0–8.5)
eGFR: 107 mL/min/1.73 (ref >59)

## 2024-02-24 LAB — MICROALBUMIN / CREATININE URINE RATIO
Creatinine, Urine: 64.7 mg/dL
Microalb/Creat Ratio: 241 mg/g{creat} — ABNORMAL HIGH (ref 0–29)
Microalbumin, Urine: 155.7 ug/mL

## 2024-02-24 LAB — HEMOGLOBIN A1C
Est. average glucose Bld gHb Est-mCnc: 140 mg/dL
Hgb A1c MFr Bld: 6.5 % — ABNORMAL HIGH (ref 4.8–5.6)

## 2024-02-24 LAB — VITAMIN B12: Vitamin B-12: 1840 pg/mL — ABNORMAL HIGH (ref 232–1245)

## 2024-02-24 LAB — TSH: TSH: 1.18 u[IU]/mL (ref 0.450–4.500)

## 2024-02-28 ENCOUNTER — Encounter: Payer: Self-pay | Admitting: Radiology

## 2024-02-28 ENCOUNTER — Ambulatory Visit: Payer: Self-pay | Admitting: Internal Medicine

## 2024-02-28 ENCOUNTER — Other Ambulatory Visit: Payer: Self-pay | Admitting: Internal Medicine

## 2024-02-28 DIAGNOSIS — R809 Proteinuria, unspecified: Secondary | ICD-10-CM

## 2024-02-28 MED ORDER — NOREL AD 4-10-325 MG PO TABS: ORAL_TABLET | ORAL | 0 refills | Status: DC

## 2024-03-01 ENCOUNTER — Other Ambulatory Visit: Payer: Self-pay | Admitting: Internal Medicine

## 2024-03-04 DIAGNOSIS — M2550 Pain in unspecified joint: Secondary | ICD-10-CM | POA: Insufficient documentation

## 2024-03-04 NOTE — Assessment & Plan Note (Signed)
 Osteoarthritis with pain in the arms and tendinitis affecting daily activities. Discussed potential use of tramadol  for pain management. - Consider tramadol  for pain management.

## 2024-03-04 NOTE — Assessment & Plan Note (Signed)
 We will be increasing dose of Trulicity  to 4.5mg  weekly. We are hopeful augments her weight loss efforts.  - Prioritize protein intake - Aim for at least 150 minutes of exercise per week.  - Incorporate more strength training into her workout routine.

## 2024-03-04 NOTE — Assessment & Plan Note (Signed)
 Chronic, diabetic foot exam was performed.  Chronic, this has been stable.  Current medication regimen includes Trulicity  3 mg, with a plan to increase to the highest dose for improved glycemic control. - Increase Trulicity  to the highest dose, 4.5mg  weekly.

## 2024-03-04 NOTE — Assessment & Plan Note (Signed)
 Bilateral carpal tunnel syndrome, worse on the left, with worsening symptoms. Last NCV was in 2022.   - Refer to hand specialist after nerve conduction study. - I will consider updated NCV.  - Wear wrist splints at night.

## 2024-03-04 NOTE — Assessment & Plan Note (Signed)

## 2024-03-04 NOTE — Assessment & Plan Note (Signed)
 Chronic, controlled.  EKG performed, NSR w/o acute changes.  She will c/w valsartan  80mg  daily and hydrochlorothiazide  12.5mg  daily.  She is advised to follow low sodium diet.

## 2024-03-04 NOTE — Assessment & Plan Note (Signed)
 Insomnia managed with Lunesta  3 mg as needed. Reports insurance limitations on medication supply. - Refill Lunesta  3 mg. - Develop bedtime routine.

## 2024-03-06 NOTE — Addendum Note (Signed)
 Addended by: EMMITT FINE T on: 03/06/2024 10:05 AM   Modules accepted: Orders

## 2024-04-13 ENCOUNTER — Encounter: Payer: Self-pay | Admitting: Internal Medicine

## 2024-04-17 ENCOUNTER — Encounter: Payer: Self-pay | Admitting: Internal Medicine

## 2024-04-24 ENCOUNTER — Encounter: Payer: Self-pay | Admitting: Internal Medicine

## 2024-04-24 ENCOUNTER — Other Ambulatory Visit: Payer: Self-pay

## 2024-04-24 MED ORDER — VALSARTAN 80 MG PO TABS: 80.0000 mg | ORAL_TABLET | Freq: Every day | ORAL | 0 refills | Status: DC

## 2024-05-02 ENCOUNTER — Other Ambulatory Visit: Payer: Self-pay | Admitting: Internal Medicine

## 2024-05-04 ENCOUNTER — Encounter: Payer: Self-pay | Admitting: Family Medicine

## 2024-05-04 ENCOUNTER — Ambulatory Visit: Admitting: Family Medicine

## 2024-05-04 VITALS — BP 120/70 | HR 92 | Temp 98.1°F | Ht 63.0 in | Wt 348.0 lb

## 2024-05-04 DIAGNOSIS — M546 Pain in thoracic spine: Secondary | ICD-10-CM | POA: Diagnosis not present

## 2024-05-04 DIAGNOSIS — M62838 Other muscle spasm: Secondary | ICD-10-CM | POA: Diagnosis not present

## 2024-05-04 MED ORDER — METHOCARBAMOL 500 MG PO TABS: 500.0000 mg | ORAL_TABLET | Freq: Three times a day (TID) | ORAL | 1 refills | Status: AC | PRN

## 2024-05-04 NOTE — Progress Notes (Signed)
 I,Jameka J Llittleton, CMA,acting as a neurosurgeon for [employer], NP.,have documented all relevant documentation on the behalf of Bruna Creighton, NP,as directed by  Bruna Creighton, NP while in the presence of Bruna Creighton, NP.  Subjective:  Patient ID: Crystal Escobar , female    DOB: 1971-03-18 , 54 y.o.   MRN: [id number]  Chief Complaint  Patient presents with   Back Pain    Patient presents today for back pain. She reports she is having pain on the left side of her back. She reports she felt the pain previously on christmas and it went away but came back.    HPI Discussed the use of AI scribe software for clinical note transcription with the patient, who gave verbal consent to proceed.  History of Present Illness  Crystal Escobar is a 54 year old female with arthritis who presents with left-sided back pain.  She has been experiencing left-sided back pain that began on Christmas. Initially, she thought it might be gas, but the pain subsided and then returned about two weeks ago. For the past three days, the pain has been mostly constant, with occasional relief. She describes the pain as an 'achy feeling' that worsens with movement, such as bending over or standing, and rates it as a 7 to 8 out of 10 on the pain scale.  The pain is located on the left side, starting from the upper back and moving down towards the mid-back, sometimes radiating around to the side. Relief is found by propping a pillow behind her or using a heating pad with a massager. She has also used medication for backache, which provides temporary relief. She is unsure if the pain involves muscle spasms, although she mentions it might feel like muscle contraction.  Her current medications include tramadol  for pain, which she uses sparingly, Ambien for sleep as needed, and Efelsa for anxiety and depression. She also uses topical treatments like Salonpas patches and cream for additional relief.  She has a history of arthritis and is  scheduled for carpal tunnel surgery on her left hand on January 21st. She exercises regularly, including weight lifting, and wonders if improper positioning during these activities might have contributed to her back pain.  No smoking. She reports difficulty sleeping due to pain, which sometimes wakes her up.    Crystal Escobar is a 54 year old female with arthritis who presents with left-sided back pain.  She has been experiencing left-sided back pain that began on Christmas. Initially, she thought it might be gas, but the pain subsided and then returned about two weeks ago. For the past three days, the pain has been mostly constant, with occasional relief. She describes the pain as an 'achy feeling' that worsens with movement, such as bending over or standing, and rates it as a 7 to 8 out of 10 on the pain scale.  The pain is located on the left side, starting from the upper back and moving down towards the mid-back, sometimes radiating around to the side. Relief is found by propping a pillow behind her or using a heating pad with a massager. She has also used medication for backache, which provides temporary relief. She is unsure if the pain involves muscle spasms, although she mentions it might feel like muscle contraction.  Her current medications include tramadol  for pain, which she uses sparingly, Ambien for sleep as needed, and Efelsa for anxiety and depression. She also uses topical treatments like Salonpas patches and cream for  additional relief.  She has a history of arthritis and is scheduled for carpal tunnel surgery on her left hand on January 21st. She exercises regularly, including weight lifting, and wonders if improper positioning during these activities might have contributed to her back pain.  No smoking. She reports difficulty sleeping due to pain, which sometimes wakes her up.        Past Medical History:  Diagnosis Date   Abnormal Pap smear 2011   Diabetes mellitus     DUB (dysfunctional uterine bleeding) 12/22/2012   HLD (hyperlipidemia)    Hypertension    Sleep apnea    does not use machine as prescribed     Family History  Problem Relation Age of Onset   Heart attack Mother    Hypertension Mother    Diabetes Mother    Seizures Father    Hypertension Sister    Sickle cell anemia Maternal Aunt    Hypertension Maternal Aunt    Lung cancer Maternal Grandmother    Heart murmur Son    Colon cancer Neg Hx     Current Medications[1]   Allergies[2]   Review of Systems  Constitutional: Negative.   Respiratory: Negative.    Cardiovascular: Negative.   Gastrointestinal: Negative.   Musculoskeletal:  Positive for arthralgias and back pain.  Psychiatric/Behavioral: Negative.       Today's Vitals   05/04/24 1508  BP: 120/70  Pulse: 92  Temp: 98.1 F (36.7 C)  TempSrc: Oral  Weight: (!) 348 lb (157.9 kg)  Height: 5' 3 (1.6 m)  PainSc: 8   PainLoc: Back   Body mass index is 61.65 kg/m.  Wt Readings from Last 3 Encounters:  05/17/24 (!) 348 lb 1.7 oz (157.9 kg)  05/04/24 (!) 348 lb (157.9 kg)  02/23/24 (!) 342 lb 6.4 oz (155.3 kg)    The 10-year ASCVD risk score (Arnett DK, et al., 2019) is: 6.2%   Values used to calculate the score:     Age: 7 years     Clinically relevant sex: Female     Is Non-Hispanic African American: Yes     Diabetic: Yes     Tobacco smoker: No     Systolic Blood Pressure: 122 mmHg     Is BP treated: Yes     HDL Cholesterol: 53 mg/dL     Total Cholesterol: 162 mg/dL  Objective:  Physical Exam Constitutional:      Appearance: Normal appearance.  HENT:     Head: Normocephalic.  Cardiovascular:     Rate and Rhythm: Normal rate and regular rhythm.     Pulses: Normal pulses.     Heart sounds: Normal heart sounds.  Pulmonary:     Effort: Pulmonary effort is normal.     Breath sounds: Normal breath sounds.  Abdominal:     General: Bowel sounds are normal.  Musculoskeletal:        General:  Tenderness present.  Neurological:     Mental Status: She is alert and oriented to person, place, and time. Mental status is at baseline.         Assessment And Plan:   Assessment & Plan Acute left-sided thoracic back pain - Advised on proper body positioning and lifting techniques. - Recommended topical ointments such as Biofreeze or diclofenac . - Encouraged continuation of exercise and weight loss efforts. Morbid obesity (HCC) She is encouraged to strive for BMI less than 30 to decrease cardiac risk. Advised to aim for at least 150 minutes of  exercise per week.  Muscle spasm - Prescribed non-drowsy muscle relaxant as needed    Return if symptoms worsen or fail to improve, for keep next appt.  Patient was given opportunity to ask questions. Patient verbalized understanding of the plan and was able to repeat key elements of the plan. All questions were answered to their satisfaction.    I, Bruna Creighton, NP, have reviewed all documentation for this visit. The documentation on 05/22/2024 for the exam, diagnosis, procedures, and orders are all accurate and complete.   IF YOU HAVE BEEN REFERRED TO A SPECIALIST, IT MAY TAKE 1-2 WEEKS TO SCHEDULE/PROCESS THE REFERRAL. IF YOU HAVE NOT HEARD FROM US /SPECIALIST IN TWO WEEKS, PLEASE GIVE US  A CALL AT [phone].      [1]  Current Outpatient Medications:    atorvastatin  (LIPITOR) 20 MG tablet, Take 1 tablet by mouth once daily, Disp: 90 tablet, Rfl: 2   CALCIUM  PO, Take 600 mg by mouth daily., Disp: , Rfl:    cetirizine  (ZYRTEC ) 10 MG tablet, TAKE 1 TABLET BY MOUTH ONCE DAILY AS NEEDED FOR ALLERGIES, Disp: 30 tablet, Rfl: 0   Cholecalciferol (VITAMIN D3) 125 MCG (5000 UT) CAPS, Take 10,000 Units by mouth daily., Disp: , Rfl:    dapagliflozin  propanediol (FARXIGA ) 5 MG TABS tablet, Take 1 tablet (5 mg total) by mouth daily before breakfast., Disp: 30 tablet, Rfl: 5   hydrochlorothiazide  (HYDRODIURIL ) 12.5 MG tablet, TAKE 1 TABLET BY  MOUTH EVERY DAY, Disp: 90 tablet, Rfl: 1   methocarbamol  (ROBAXIN ) 500 MG tablet, Take 1 tablet (500 mg total) by mouth every 8 (eight) hours as needed for muscle spasms., Disp: 30 tablet, Rfl: 1   Multiple Vitamin (MULTIVITAMIN) capsule, Take 1 capsule by mouth daily., Disp: , Rfl:    Probiotic Product (PROBIOTIC PO), Take 1 capsule by mouth daily., Disp: , Rfl:    traMADol  (ULTRAM ) 50 MG tablet, TAKE 1 TABLET BY MOUTH EVERY 6 HOURS AS NEEDED., Disp: 20 tablet, Rfl: 0   valsartan  (DIOVAN ) 80 MG tablet, Take 1 tablet (80 mg total) by mouth daily., Disp: 90 tablet, Rfl: 0   venlafaxine  XR (EFFEXOR  XR) 75 MG 24 hr capsule, One capsule po qd, Disp: 30 capsule, Rfl: 2   venlafaxine  XR (EFFEXOR -XR) 150 MG 24 hr capsule, Take 150 mg by mouth See admin instructions. Take with 75 mg for a total of 225 mg daily, Disp: , Rfl:    Camphor-Menthol-Methyl Sal (SALONPAS) 3.05-02-08 % PTCH, Apply 1 patch topically daily as needed (Pain)., Disp: , Rfl:    clonazePAM (KLONOPIN) 0.5 MG tablet, Take 0.5 mg by mouth 2 (two) times daily as needed for anxiety., Disp: , Rfl:    Dulaglutide  (TRULICITY ) 4.5 MG/0.5ML SOAJ, INJECT 4.5 MG AS DIRECTED ONCE A WEEK., Disp: 2 mL, Rfl: 2   Liniments (SALONPAS ARTHRITIS PAIN RELIEF EX), Apply 1 application  topically daily as needed (pain)., Disp: , Rfl:    Misc Natural Products (OSTEO BI-FLEX TRIPLE STRENGTH) TABS, Take 1 tablet by mouth daily., Disp: , Rfl:    zolpidem (AMBIEN CR) 12.5 MG CR tablet, Take 12.5 mg by mouth at bedtime., Disp: , Rfl:    zolpidem (AMBIEN) 10 MG tablet, Take 10 mg by mouth at bedtime as needed for sleep., Disp: , Rfl:  [2] No Known Allergies

## 2024-05-09 ENCOUNTER — Telehealth: Payer: Self-pay

## 2024-05-12 ENCOUNTER — Other Ambulatory Visit: Payer: Self-pay

## 2024-05-12 ENCOUNTER — Encounter (HOSPITAL_COMMUNITY): Payer: Self-pay | Admitting: Orthopedic Surgery

## 2024-05-12 NOTE — Progress Notes (Signed)
 SDW call  Patient was given pre-op instructions over the phone. Patient verbalized understanding of instructions provided.  Denied any SOB, fever or cough   PCP - Dr. Catheryn Slocumb Cardiologist -  Pulmonary:    PPM/ICD - denies Device Orders - na Rep Notified - na   Chest x-ray -  EKG -  02/23/2024 Stress Test - ECHO - 08/11/2023 Cardiac Cath -   Sleep Study/sleep apnea/CPAP: + OSA, does not wear CPAP  Type II diabetic. A1C 6.5 on 02/24/2024, does not check her blood sugars Fasting Blood sugar range How often check sugars Farxiga , hold 72 hrs, states last dose will be 05/13/2024  GLP1: Trulicity , last dose 05/06/2024   Blood Thinner Instructions: denies Aspirin Instructions:denies   ERAS Protcol - Clears until 1115  Anesthesia review: No  Your procedure is scheduled on Wednesday May 17, 2024   Report to [facility] Main Entrance A at  1145  A.M., then check in with the Admitting office.  Call this number if you have problems the morning of surgery:  [phone]   If you have any questions prior to your surgery date call [phone]: Open Monday-Friday 8am-4pm If you experience any cold or flu symptoms such as cough, fever, chills, shortness of breath, etc. between now and your scheduled surgery, please notify us  at the above number    Remember:  Do not eat after midnight the night before your surgery  You may drink clear liquids until  1115   the morning of your surgery.   Clear liquids allowed are: Water , Non-Citrus Juices (without pulp), Carbonated Beverages, Clear Tea, Black Coffee ONLY (NO MILK, CREAM OR POWDERED CREAMER of any kind), and Gatorade   Take these medicines the morning of surgery with A SIP OF WATER :  Atorvastatin , klonopin, effexor   As needed: Zyrtec , robaxin , tramadol   As of today, STOP taking any Aspirin (unless otherwise instructed by your surgeon) Aleve, Naproxen, Ibuprofen , Motrin , Advil , Goody's, BC's, all herbal medications, fish  oil, and all vitamins.

## 2024-05-14 ENCOUNTER — Other Ambulatory Visit: Payer: Self-pay | Admitting: Internal Medicine

## 2024-05-17 ENCOUNTER — Ambulatory Visit (HOSPITAL_COMMUNITY)
Admission: RE | Admit: 2024-05-17 | Discharge: 2024-05-17 | Disposition: A | Discharge status: Home / Self Care | Attending: Orthopedic Surgery | Admitting: Orthopedic Surgery

## 2024-05-17 ENCOUNTER — Encounter (HOSPITAL_COMMUNITY): Payer: Self-pay | Admitting: Orthopedic Surgery

## 2024-05-17 ENCOUNTER — Ambulatory Visit (HOSPITAL_COMMUNITY): Admitting: Anesthesiology

## 2024-05-17 ENCOUNTER — Encounter (HOSPITAL_COMMUNITY)
Admission: RE | Disposition: A | Discharge status: Home / Self Care | Payer: Self-pay | Source: Home / Self Care | Attending: Orthopedic Surgery

## 2024-05-17 DIAGNOSIS — Z87891 Personal history of nicotine dependence: Secondary | ICD-10-CM | POA: Diagnosis not present

## 2024-05-17 DIAGNOSIS — I10 Essential (primary) hypertension: Secondary | ICD-10-CM | POA: Diagnosis not present

## 2024-05-17 DIAGNOSIS — E119 Type 2 diabetes mellitus without complications: Secondary | ICD-10-CM | POA: Diagnosis not present

## 2024-05-17 DIAGNOSIS — G5602 Carpal tunnel syndrome, left upper limb: Secondary | ICD-10-CM | POA: Insufficient documentation

## 2024-05-17 DIAGNOSIS — Z6841 Body Mass Index (BMI) 40.0 and over, adult: Secondary | ICD-10-CM | POA: Insufficient documentation

## 2024-05-17 DIAGNOSIS — E6689 Other obesity not elsewhere classified: Secondary | ICD-10-CM | POA: Insufficient documentation

## 2024-05-17 DIAGNOSIS — Z01818 Encounter for other preprocedural examination: Secondary | ICD-10-CM

## 2024-05-17 HISTORY — PX: CARPAL TUNNEL RELEASE: SHX101

## 2024-05-17 LAB — GLUCOSE, CAPILLARY
Glucose-Capillary: 93 mg/dL (ref 70–99)
Glucose-Capillary: 77 mg/dL (ref 70–99)

## 2024-05-17 LAB — CBC
HCT: 40.5 % (ref 36.0–46.0)
Hemoglobin: 13.2 g/dL (ref 12.0–15.0)
MCH: 29.1 pg (ref 26.0–34.0)
MCHC: 32.6 g/dL (ref 30.0–36.0)
MCV: 89.2 fL (ref 80.0–100.0)
Platelets: 303 K/uL (ref 150–400)
RBC: 4.54 MIL/uL (ref 3.87–5.11)
RDW: 15.3 % (ref 11.5–15.5)
WBC: 6.9 K/uL (ref 4.0–10.5)
nRBC: 0 % (ref 0.0–0.2)

## 2024-05-17 LAB — BASIC METABOLIC PANEL WITH GFR
Anion gap: 10 (ref 5–15)
BUN: 6 mg/dL (ref 6–20)
CO2: 23 mmol/L (ref 22–32)
Calcium: 8.8 mg/dL — ABNORMAL LOW (ref 8.9–10.3)
Chloride: 106 mmol/L (ref 98–111)
Creatinine, Ser: 0.62 mg/dL (ref 0.44–1.00)
GFR, Estimated: >60 mL/min
Glucose, Bld: 92 mg/dL (ref 70–99)
Potassium: 4.3 mmol/L (ref 3.5–5.1)
Sodium: 140 mmol/L (ref 135–145)

## 2024-05-17 LAB — POCT PREGNANCY, URINE: Preg Test, Ur: NEGATIVE

## 2024-05-17 MED ORDER — CEFAZOLIN SODIUM-DEXTROSE 3-4 GM/150ML-% IV SOLN
3.0000 g | INTRAVENOUS | Status: AC
  Administered 2024-05-17: 3 g via INTRAVENOUS
  Filled 2024-05-17: qty 150

## 2024-05-17 MED ORDER — MIDAZOLAM HCL 2 MG/2ML IJ SOLN
INTRAMUSCULAR | Status: AC
  Filled 2024-05-17: qty 2

## 2024-05-17 MED ORDER — BUPIVACAINE HCL (PF) 0.25 % IJ SOLN
INTRAMUSCULAR | Status: DC | PRN
  Administered 2024-05-17: 10 mL

## 2024-05-17 MED ORDER — TRAMADOL HCL 50 MG PO TABS: 50.0000 mg | ORAL_TABLET | Freq: Four times a day (QID) | ORAL | 0 refills | Status: AC | PRN

## 2024-05-17 MED ORDER — OXYCODONE HCL 5 MG PO TABS: 5.0000 mg | ORAL_TABLET | Freq: Four times a day (QID) | ORAL | 0 refills | Status: DC | PRN

## 2024-05-17 MED ORDER — MEPIVACAINE HCL (PF) 2 % IJ SOLN
INTRAMUSCULAR | Status: DC | PRN
  Administered 2024-05-17: 20 mL

## 2024-05-17 MED ORDER — PROPOFOL 10 MG/ML IV BOLUS
INTRAVENOUS | Status: AC
  Filled 2024-05-17: qty 20

## 2024-05-17 MED ORDER — ORAL CARE MOUTH RINSE: 15.0000 mL | Freq: Once | OROMUCOSAL | Status: AC

## 2024-05-17 MED ORDER — LACTATED RINGERS IV SOLN: INTRAVENOUS | Status: DC

## 2024-05-17 MED ORDER — MIDAZOLAM HCL (PF) 2 MG/2ML IJ SOLN
INTRAMUSCULAR | Status: DC | PRN
  Administered 2024-05-17: 2 mg via INTRAVENOUS

## 2024-05-17 MED ORDER — CHLORHEXIDINE GLUCONATE 0.12 % MT SOLN
15.0000 mL | Freq: Once | OROMUCOSAL | Status: AC
  Administered 2024-05-17: 15 mL via OROMUCOSAL
  Filled 2024-05-17: qty 15

## 2024-05-17 MED ORDER — BUPIVACAINE HCL (PF) 0.25 % IJ SOLN
INTRAMUSCULAR | Status: AC
  Filled 2024-05-17: qty 30

## 2024-05-17 NOTE — Anesthesia Preprocedure Evaluation (Signed)
"                                    Anesthesia Evaluation  Patient identified by MRN, date of birth, ID band Patient awake    Reviewed: Allergy & Precautions, NPO status , Patient's Chart, lab work & pertinent test results  History of Anesthesia Complications Negative for: history of anesthetic complications  Airway Mallampati: II  TM Distance: >3 FB Neck ROM: Full    Dental no notable dental hx. (+) Teeth Intact   Pulmonary sleep apnea , neg COPD, Patient abstained from smoking.Not current smoker, former smoker   Pulmonary exam normal breath sounds clear to auscultation       Cardiovascular Exercise Tolerance: Good METShypertension, Pt. on medications (-) CAD and (-) Past MI (-) dysrhythmias  Rhythm:Regular Rate:Normal - Systolic murmurs    Neuro/Psych negative neurological ROS  negative psych ROS   GI/Hepatic ,neg GERD  ,,(+)     (-) substance abuse    Endo/Other  diabetes, Well Controlled  Class 4 obesityGLP1ra last taken > 7 days ago. Denies GI symptoms today  Renal/GU negative Renal ROS     Musculoskeletal   Abdominal  (+) + obese  Peds  Hematology   Anesthesia Other Findings Past Medical History: 2011: Abnormal Pap smear No date: Diabetes mellitus 12/22/2012: DUB (dysfunctional uterine bleeding) No date: HLD (hyperlipidemia) No date: Hypertension No date: Sleep apnea     Comment:  does not use machine as prescribed  Reproductive/Obstetrics                              Anesthesia Physical Anesthesia Plan  ASA: 3  Anesthesia Plan: Regional   Post-op Pain Management: Ofirmev IV (intra-op)*, Toradol IV (intra-op)* and Regional block*   Induction: Intravenous  PONV Risk Score and Plan: 2 and Propofol  infusion, TIVA, Ondansetron, Midazolam  and Dexamethasone  Airway Management Planned: Nasal Cannula and Natural Airway  Additional Equipment: None  Intra-op Plan:   Post-operative Plan:   Informed  Consent: I have reviewed the patients History and Physical, chart, labs and discussed the procedure including the risks, benefits and alternatives for the proposed anesthesia with the patient or authorized representative who has indicated his/her understanding and acceptance.     Dental advisory given  Plan Discussed with: CRNA and Surgeon  Anesthesia Plan Comments: (Discussed r/b/a of different anesthesia types including MAC, GETA, and regional, in relation to patient's high BMI and higher risk of respiratory depression. Patient ultimately consents and agrees with nerve block with minimal sedation. Discussed r/b/a of axillary nerve block, including:  - bleeding, infection, nerve damage - poor or non functioning block requiring conversion to GETA - reactions and toxicity to local anesthetic Patient understands. Discussed risks of sedation with patient, including possibility of difficulty with spontaneous ventilation under anesthesia necessitating airway intervention, PONV, and rare risks such as cardiac or respiratory or neurological events, and allergic reactions. Discussed the role of CRNA in patient's perioperative care. Patient understands. Patient informed about increased incidence of above perioperative risk due to high BMI. Patient understands.  )        Anesthesia Quick Evaluation  "

## 2024-05-17 NOTE — Anesthesia Procedure Notes (Signed)
 Anesthesia Regional Block: Axillary brachial plexus block   Pre-Anesthetic Checklist: , timeout performed,  Correct Patient, Correct Site, Correct Laterality,  Correct Procedure, Correct Position, site marked,  Risks and benefits discussed,  Surgical consent,  Pre-op evaluation,  At surgeon's request and post-op pain management  Laterality: Left  Prep: chloraprep       Needles:  Injection technique: Single-shot  Needle Type: Echogenic Needle     Needle Length: 4cm  Needle Gauge: 25     Additional Needles:   Narrative:  Start time: 05/17/2024 1:25 PM End time: 05/17/2024 1:28 PM Injection made incrementally with aspirations every 5 mL.  Performed by: Personally  Anesthesiologist: Boone Fess, MD  Additional Notes: Patient's chart reviewed and they were deemed appropriate candidate for procedure, per surgeon's request. Patient educated about risks, benefits, and alternatives of the block including but not limited to: temporary or permanent nerve damage, bleeding, infection, damage to surround tissues, block failure, local anesthetic toxicity. Patient expressed understanding. A formal time-out was conducted consistent with institution rules.  Monitors were applied, and minimal sedation used (see nursing record). The site was prepped with skin prep and allowed to dry, and sterile gloves were used. A high frequency linear ultrasound probe with probe cover was utilized throughout. Axillary artery visualized, along with nerve roots of musculocutaneous, median, ulnar, radial nerves adjacent to it and they appeared anatomically normal. Local anesthetic injected around the axillary artery, including 5ml around the musculocutaneous nerve, and echogenic block needle trajectory was monitored throughout. Aspiration performed every 5ml. Blood vessels were avoided. All injections were performed without resistance and free of blood and paresthesias. The patient tolerated the procedure  well.  Injectate: 20ml 2% mepivacaine 

## 2024-05-17 NOTE — H&P (Signed)
 " HAND SURGERY   HPI: Patient is a 54 y.o. female who presents with left carpal tunnel syndrome that has failed conservative management.  Patient denies any changes to their medical history or new systemic symptoms today.    Past Medical History:  Diagnosis Date   Abnormal Pap smear 2011   Diabetes mellitus    DUB (dysfunctional uterine bleeding) 12/22/2012   HLD (hyperlipidemia)    Hypertension    Sleep apnea    does not use machine as prescribed   Past Surgical History:  Procedure Laterality Date   CARPAL TUNNEL RELEASE Right 2016   CESAREAN SECTION     COLONOSCOPY WITH PROPOFOL  N/A 09/10/2020   Procedure: COLONOSCOPY WITH PROPOFOL ;  Surgeon: Cindie Carlin POUR, DO;  [facility]: [facility];  Service: Endoscopy;  Laterality: N/A;  AM (diabetic)   GASTRIC BYPASS  05/2019   [facility]: Dr. Georgian   KNEE SURGERY Left    cartilage   POLYPECTOMY  09/10/2020   Procedure: POLYPECTOMY;  Surgeon: Cindie Carlin POUR, DO;  Location: [facility];  Service: Endoscopy;;   TUBAL LIGATION     Social History   Socioeconomic History   Marital status: Married    Spouse name: Elspeth   Number of children: 2   Years of education: Not on file   Highest education level: Some college, no degree  Occupational History   Not on file  Tobacco Use   Smoking status: Former    Types: Cigars    Quit date: 2020    Years since quitting: 6.0    Passive exposure: Never   Smokeless tobacco: Never  Vaping Use   Vaping status: Never Used  Substance and Sexual Activity   Alcohol use: Yes    Comment: occasionally    Drug use: No   Sexual activity: Yes    Partners: Male    Birth control/protection: Surgical    Comment: BTL  Other Topics Concern   Not on file  Social History Narrative   Lives with husband and 2 children   Right handed   Caffeine: 1 cup of coffee a day   Social Drivers of Health   Tobacco Use: Medium Risk (05/12/2024)   Patient History    Smoking Tobacco Use: Former    Smokeless  Tobacco Use: Never    Passive Exposure: Never  Physicist, Medical Strain: Low Risk (05/03/2024)   Overall Financial Resource Strain (CARDIA)    Difficulty of Paying Living Expenses: Not very hard  Food Insecurity: Food Insecurity Present (05/03/2024)   Epic    Worried About Programme Researcher, Broadcasting/film/video in the Last Year: Sometimes true    Ran Out of Food in the Last Year: Never true  Transportation Needs: No Transportation Needs (05/03/2024)   Epic    Lack of Transportation (Medical): No    Lack of Transportation (Non-Medical): No  Physical Activity: Insufficiently Active (05/03/2024)   Exercise Vital Sign    Days of Exercise per Week: 4 days    Minutes of Exercise per Session: 20 min  Stress: Stress Concern Present (05/03/2024)   [employer] of Occupational Health - Occupational Stress Questionnaire    Feeling of Stress: To some extent  Social Connections: Socially Integrated (05/03/2024)   Social Connection and Isolation Panel    Frequency of Communication with Friends and Family: More than three times a week    Frequency of Social Gatherings with Friends and Family: Once a week    Attends Religious Services: More than 4 times  per year    Active Member of Clubs or Organizations: Yes    Attends Banker Meetings: More than 4 times per year    Marital Status: Married  Depression (PHQ2-9): Low Risk (02/23/2024)   Depression (PHQ2-9)    PHQ-2 Score: 0  Alcohol Screen: Low Risk (05/03/2024)   Alcohol Screen    Last Alcohol Screening Score (AUDIT): 1  Housing: Low Risk (05/03/2024)   Epic    Unable to Pay for Housing in the Last Year: No    Number of Times Moved in the Last Year: 0    Homeless in the Last Year: No  Utilities: Not on file  Health Literacy: Not on file   Family History  Problem Relation Age of Onset   Heart attack Mother    Hypertension Mother    Diabetes Mother    Seizures Father    Hypertension Sister    Sickle cell anemia Maternal Aunt    Hypertension Maternal  Aunt    Lung cancer Maternal Grandmother    Heart murmur Son    Colon cancer Neg Hx    - negative except otherwise stated in the family history section Allergies[1] Prior to Admission medications  Medication Sig Start Date End Date Taking? Authorizing Provider  atorvastatin  (LIPITOR) 20 MG tablet Take 1 tablet by mouth once daily 09/09/23  Yes Jarold Medici, MD  CALCIUM  PO Take 600 mg by mouth daily.   Yes [provider]  Camphor-Menthol-Methyl Sal (SALONPAS) 3.05-02-08 % PTCH Apply 1 patch topically daily as needed (Pain).   Yes [provider]  cetirizine  (ZYRTEC ) 10 MG tablet TAKE 1 TABLET BY MOUTH ONCE DAILY AS NEEDED FOR ALLERGIES 02/10/24  Yes Jarold Medici, MD  Cholecalciferol (VITAMIN D3) 125 MCG (5000 UT) CAPS Take 10,000 Units by mouth daily.   Yes [provider]  clonazePAM (KLONOPIN) 0.5 MG tablet Take 0.5 mg by mouth 2 (two) times daily as needed for anxiety.   Yes [provider]  dapagliflozin  propanediol (FARXIGA ) 5 MG TABS tablet Take 1 tablet (5 mg total) by mouth daily before breakfast. 10/06/23  Yes Jarold Medici, MD  hydrochlorothiazide  (HYDRODIURIL ) 12.5 MG tablet TAKE 1 TABLET BY MOUTH EVERY DAY 12/01/23  Yes Jarold Medici, MD  [facility] ARTHRITIS PAIN RELIEF EX) Apply 1 application  topically daily as needed (pain).   Yes [provider]  methocarbamol  (ROBAXIN ) 500 MG tablet Take 1 tablet (500 mg total) by mouth every 8 (eight) hours as needed for muscle spasms. 05/04/24  Yes Petrina Pries, NP  Misc Natural Products (OSTEO BI-FLEX TRIPLE STRENGTH) TABS Take 1 tablet by mouth daily.   Yes [provider]  Multiple Vitamin (MULTIVITAMIN) capsule Take 1 capsule by mouth daily.   Yes [provider]  Probiotic Product (PROBIOTIC PO) Take 1 capsule by mouth daily.   Yes [provider]  traMADol  (ULTRAM ) 50 MG tablet TAKE 1 TABLET BY MOUTH EVERY 6 HOURS AS NEEDED. 05/04/24  Yes Jarold Medici, MD   valsartan  (DIOVAN ) 80 MG tablet Take 1 tablet (80 mg total) by mouth daily. 04/24/24  Yes Jarold Medici, MD  venlafaxine  XR (EFFEXOR  XR) 75 MG 24 hr capsule One capsule po qd 09/01/23  Yes Jarold Medici, MD  venlafaxine  XR (EFFEXOR -XR) 150 MG 24 hr capsule Take 150 mg by mouth See admin instructions. Take with 75 mg for a total of 225 mg daily   Yes [provider]  zolpidem (AMBIEN CR) 12.5 MG CR tablet Take 12.5  mg by mouth at bedtime. 04/12/24  Yes [provider]  zolpidem (AMBIEN) 10 MG tablet Take 10 mg by mouth at bedtime as needed for sleep.   Yes [provider]  Dulaglutide  (TRULICITY ) 4.5 MG/0.5ML SOAJ INJECT 4.5 MG AS DIRECTED ONCE A WEEK. 05/15/24   Jarold Medici, MD   No results found. - Positive ROS: All other systems have been reviewed and were otherwise negative with the exception of those mentioned in the HPI and as above.  Physical Exam: General: No acute distress, resting comfortably Cardiovascular: BUE warm and well perfused, normal rate Respiratory: Normal WOB on RA Skin: Warm and dry Neurologic: Sensation intact distally Psychiatric: Patient is at baseline mood and affect  Left Upper Extremity  Positive Tinel sign Positive Phalen's test Negative Durkan's test  Negative Tinel at elbow  No thenar atrophy No intrinsic atrophy Normal two-point discrimination  Full and painless AROM of wrist and fingers. Fingers warm and well perfused with brisk capillary refill  Assessment: 54 yo F w/ left carpal tunnel syndrome that has failed conservative management.   Plan: OR today for left carpal tunnel release. We again reviewed the risks of surgery which include bleeding, infection, damage to neurovascular structures including the median nerve or it's branches, persistent symptoms, pillar pain or scar sensitivity, need for additional surgery.  Informed consent was signed.  All questions were answered.   Bebe Galla,  M.D. [facility] 10:57 AM     [1] No Known Allergies  "

## 2024-05-17 NOTE — H&P (Signed)
 " HAND SURGERY   HPI: Patient is a 54 y.o. female who presents with left carpal tunnel syndrome that has failed conservative management.  Patient denies any changes to their medical history or new systemic symptoms today.    Past Medical History:  Diagnosis Date   Abnormal Pap smear 2011   Diabetes mellitus    DUB (dysfunctional uterine bleeding) 12/22/2012   HLD (hyperlipidemia)    Hypertension    Sleep apnea    does not use machine as prescribed   Past Surgical History:  Procedure Laterality Date   CARPAL TUNNEL RELEASE Right 2016   CESAREAN SECTION     COLONOSCOPY WITH PROPOFOL  N/A 09/10/2020   Procedure: COLONOSCOPY WITH PROPOFOL ;  Surgeon: Cindie Carlin POUR, DO;  [facility];  Service: Endoscopy;  Laterality: N/A;  AM (diabetic)   GASTRIC BYPASS  05/2019   [facility]: Dr. Georgian   KNEE SURGERY Left    cartilage   POLYPECTOMY  09/10/2020   Procedure: POLYPECTOMY;  Surgeon: Cindie Carlin POUR, DO;  [facility];  Service: Endoscopy;;   TUBAL LIGATION     Social History   Socioeconomic History   Marital status: Married    Spouse name: Elspeth   Number of children: 2   Years of education: Not on file   Highest education level: Some college, no degree  Occupational History   Not on file  Tobacco Use   Smoking status: Former    Types: Cigars    Quit date: 2020    Years since quitting: 6.0    Passive exposure: Never   Smokeless tobacco: Never  Vaping Use   Vaping status: Never Used  Substance and Sexual Activity   Alcohol use: Yes    Comment: occasionally    Drug use: No   Sexual activity: Yes    Partners: Male    Birth control/protection: Surgical    Comment: BTL  Other Topics Concern   Not on file  Social History Narrative   Lives with husband and 2 children   Right handed   Caffeine: 1 cup of coffee a day   Social Drivers of Health   Tobacco Use: Medium Risk (05/12/2024)   Patient History    Smoking Tobacco Use: Former    Smokeless  Tobacco Use: Never    Passive Exposure: Never  Physicist, Medical Strain: Low Risk (05/03/2024)   Overall Financial Resource Strain (CARDIA)    Difficulty of Paying Living Expenses: Not very hard  Food Insecurity: Food Insecurity Present (05/03/2024)   Epic    Worried About Programme Researcher, Broadcasting/film/video in the Last Year: Sometimes true    Ran Out of Food in the Last Year: Never true  Transportation Needs: No Transportation Needs (05/03/2024)   Epic    Lack of Transportation (Medical): No    Lack of Transportation (Non-Medical): No  Physical Activity: Insufficiently Active (05/03/2024)   Exercise Vital Sign    Days of Exercise per Week: 4 days    Minutes of Exercise per Session: 20 min  Stress: Stress Concern Present (05/03/2024)   [employer] of Occupational Health - Occupational Stress Questionnaire    Feeling of Stress: To some extent  Social Connections: Socially Integrated (05/03/2024)   Social Connection and Isolation Panel    Frequency of Communication with Friends and Family: More than three times a week    Frequency of Social Gatherings with Friends and Family: Once a week    Attends Religious Services: More than 4 times  per year    Active Member of Clubs or Organizations: Yes    Attends Banker Meetings: More than 4 times per year    Marital Status: Married  Depression (PHQ2-9): Low Risk (02/23/2024)   Depression (PHQ2-9)    PHQ-2 Score: 0  Alcohol Screen: Low Risk (05/03/2024)   Alcohol Screen    Last Alcohol Screening Score (AUDIT): 1  Housing: Low Risk (05/03/2024)   Epic    Unable to Pay for Housing in the Last Year: No    Number of Times Moved in the Last Year: 0    Homeless in the Last Year: No  Utilities: Not on file  Health Literacy: Not on file   Family History  Problem Relation Age of Onset   Heart attack Mother    Hypertension Mother    Diabetes Mother    Seizures Father    Hypertension Sister    Sickle cell anemia Maternal Aunt    Hypertension Maternal  Aunt    Lung cancer Maternal Grandmother    Heart murmur Son    Colon cancer Neg Hx    - negative except otherwise stated in the family history section Allergies[1] Prior to Admission medications  Medication Sig Start Date End Date Taking? Authorizing Provider  atorvastatin  (LIPITOR) 20 MG tablet Take 1 tablet by mouth once daily 09/09/23  Yes Jarold Medici, MD  CALCIUM  PO Take 600 mg by mouth daily.   Yes [provider]  Camphor-Menthol-Methyl Sal (SALONPAS) 3.05-02-08 % PTCH Apply 1 patch topically daily as needed (Pain).   Yes [provider]  cetirizine  (ZYRTEC ) 10 MG tablet TAKE 1 TABLET BY MOUTH ONCE DAILY AS NEEDED FOR ALLERGIES 02/10/24  Yes Jarold Medici, MD  Cholecalciferol (VITAMIN D3) 125 MCG (5000 UT) CAPS Take 10,000 Units by mouth daily.   Yes [provider]  clonazePAM (KLONOPIN) 0.5 MG tablet Take 0.5 mg by mouth 2 (two) times daily as needed for anxiety.   Yes [provider]  dapagliflozin  propanediol (FARXIGA ) 5 MG TABS tablet Take 1 tablet (5 mg total) by mouth daily before breakfast. 10/06/23  Yes Jarold Medici, MD  hydrochlorothiazide  (HYDRODIURIL ) 12.5 MG tablet TAKE 1 TABLET BY MOUTH EVERY DAY 12/01/23  Yes Jarold Medici, MD  [facility] ARTHRITIS PAIN RELIEF EX) Apply 1 application  topically daily as needed (pain).   Yes [provider]  methocarbamol  (ROBAXIN ) 500 MG tablet Take 1 tablet (500 mg total) by mouth every 8 (eight) hours as needed for muscle spasms. 05/04/24  Yes Petrina Pries, NP  Misc Natural Products (OSTEO BI-FLEX TRIPLE STRENGTH) TABS Take 1 tablet by mouth daily.   Yes [provider]  Multiple Vitamin (MULTIVITAMIN) capsule Take 1 capsule by mouth daily.   Yes [provider]  Probiotic Product (PROBIOTIC PO) Take 1 capsule by mouth daily.   Yes [provider]  traMADol  (ULTRAM ) 50 MG tablet TAKE 1 TABLET BY MOUTH EVERY 6 HOURS AS NEEDED. 05/04/24  Yes Jarold Medici, MD   valsartan  (DIOVAN ) 80 MG tablet Take 1 tablet (80 mg total) by mouth daily. 04/24/24  Yes Jarold Medici, MD  venlafaxine  XR (EFFEXOR  XR) 75 MG 24 hr capsule One capsule po qd 09/01/23  Yes Jarold Medici, MD  venlafaxine  XR (EFFEXOR -XR) 150 MG 24 hr capsule Take 150 mg by mouth See admin instructions. Take with 75 mg for a total of 225 mg daily   Yes [provider]  zolpidem (AMBIEN CR) 12.5 MG CR tablet Take 12.5  mg by mouth at bedtime. 04/12/24  Yes [provider]  zolpidem (AMBIEN) 10 MG tablet Take 10 mg by mouth at bedtime as needed for sleep.   Yes [provider]  Dulaglutide  (TRULICITY ) 4.5 MG/0.5ML SOAJ INJECT 4.5 MG AS DIRECTED ONCE A WEEK. 05/15/24   Jarold Medici, MD   No results found. - Positive ROS: All other systems have been reviewed and were otherwise negative with the exception of those mentioned in the HPI and as above.  Physical Exam: General: No acute distress, resting comfortably Cardiovascular: BUE warm and well perfused, normal rate Respiratory: Normal WOB on RA Skin: Warm and dry Neurologic: Sensation intact distally Psychiatric: Patient is at baseline mood and affect  Left Upper Extremity  Positive Tinel sign Positive Phalen's test Negative Durkan's test  Negative Tinel at elbow  No thenar atrophy No intrinsic atrophy Normal two-point discrimination  Full and painless AROM of wrist and fingers. Fingers warm and well perfused with brisk capillary refill   Assessment: 54 yo F with left carpal tunnel syndrome that has failed conservative management.   Plan: OR today for left carpal tunnel release.  We again reviewed the risks of surgery which include bleeding, infection, damage to neurovascular structures including the median nerve or it's branches, persistent symptoms, scar sensitivity or pillar pain, need for additional surgery.   All questions were answered.   Bebe Galla, M.D. [facility] 11:01 AM     [1]  No Known Allergies  "

## 2024-05-17 NOTE — Discharge Instructions (Signed)
 Waylan Rocher, M.D. Hand Surgery  POST-OPERATIVE DISCHARGE INSTRUCTIONS   PRESCRIPTIONS: - You may have been given a prescription to be taken as directed for post-operative pain control.  You may also take over the counter ibuprofen/aleve and tylenol for pain. Take this as directed on the packaging. Do not exceed 3000 mg tylenol/acetaminophen in 24 hours.  Ibuprofen 600-800 mg (3-4) tablets by mouth every 6 hours as needed for pain.   OR  Aleve 2 tablets by mouth every 12 hours (twice daily) as needed for pain.   AND/OR  Tylenol 1000 mg (2 tablets) every 8 hours as needed for pain.  - Please use your pain medication carefully, as refills are limited and you may not be provided with one.  As stated above, please use over the counter pain medicine - it will also be helpful with decreasing your swelling.    ANESTHESIA: -After your surgery, post-surgical discomfort or pain is likely. This discomfort can last several days to a few weeks. At certain times of the day your discomfort may be more intense.   Did you receive a nerve block?   - A nerve block can provide pain relief for one hour to two days after your surgery. As long as the nerve block is working, you will experience little or no sensation in the area the surgeon operated on.  - As the nerve block wears off, you will begin to experience pain or discomfort. It is very important that you begin taking your prescribed pain medication before the nerve block fully wears off. Treating your pain at the first sign of the block wearing off will ensure your pain is better controlled and more tolerable when full-sensation returns. Do not wait until the pain is intolerable, as the medicine will be less effective. It is better to treat pain in advance than to try and catch up.   General Anesthesia:  If you did not receive a nerve block during your surgery, you will need to start taking your pain medication shortly after your surgery and  should continue to do so as prescribed by your surgeon.     ICE AND ELEVATION: - You may use ice for the first 48-72 hours, but it is not critical.   - Motion of your fingers is very important to decrease the swelling.  - Elevation, as much as possible for the next 48 hours, is critical for decreasing swelling as well as for pain relief. Elevation means when you are seated or lying down, you hand should be at or above your heart. When walking, the hand needs to be at or above the level of your elbow.  - If the bandage gets too tight, it may need to be loosened. Please contact our office and we will instruct you in how to do this.    SURGICAL BANDAGES:  - Keep your dressing and/or splint clean and dry at all times.  You can remove your dressing 7 days from now and change with a dry dressing or Band-Aids as needed thereafter. - You may place a plastic bag over your bandage to shower, but be careful, do not get your bandages wet.  - After the bandages have been removed, it is OK to get the stitches wet in a shower or with hand washing. Do Not soak or submerge the wound yet. Please do not use lotions or creams on the stitches.      HAND THERAPY:  - You may not need any. If you  do, we will begin this at your follow up visit in the clinic.    ACTIVITY AND WORK: - You are encouraged to move any fingers which are not in the bandage.  - Light use of the fingers is allowed to assist the other hand with daily hygiene and eating, but strong gripping or lifting is often uncomfortable and should be avoided.  - You might miss a variable period of time from work and hopefully this issue has been discussed prior to surgery. You may not do any heavy work with your affected hand for about 2 weeks.    [facility] Second Floor, [address] [place], [place] [address] [phone]

## 2024-05-17 NOTE — Op Note (Signed)
" ° °  Date of Surgery: 05/17/2024  INDICATIONS: Patient is a 54 y.o.-year-old female with left carpal tunnel syndrome that has failed conservative management.  Risks, benefits, and alternatives to surgery were again discussed with the patient in the preoperative area. The patient wishes to proceed with surgery.  Informed consent was signed after our discussion.   PREOPERATIVE DIAGNOSIS:  Left carpal tunnel syndrome  POSTOPERATIVE DIAGNOSIS: Same.  PROCEDURE:  Left carpal tunnel release   SURGEON: Carlin Galla, M.D.  ASSIST: None  ANESTHESIA:  Regional  IV FLUIDS AND URINE: See anesthesia.  ESTIMATED BLOOD LOSS: <5 mL.  IMPLANTS: * No implants in log *   DRAINS: None  COMPLICATIONS: None  DESCRIPTION OF PROCEDURE:   The patient was met in the preoperative holding area where the surgical site was marked and the informed consent form was signed.  The patient was then brought back to the operating room and transferred to the OR table.  A hand table was placed adjacent to the operative extremity and locked into place.  A tourniquet was placed on the left forearm forearm.  A formal timeout was performed to confirm that this was the correct patient, surgical side, surgical site, and surgical procedure.  A regional block was performed by anesthesia but had not set up yet. All were present and in agreement. Following formal timeout, a local block was performed using 10 mL of 0.025% plain marcaine .  The left upper extremity was then prepped and draped in the usual and sterile fashion.   Following a second timeout, the limb was exsanguinated and the tourniquet inflated to 250 mmHg.  A longitudinal incision was made in line with the radial border of the ring finger from distal to the wrist flexion crease to the intersection of Kaplan's cardinal line.  The skin and subcutaneous tissue was sharply divided.  The longitudinally running palmar fascia was incised.  The thenar musculature was bluntly  swept off of the transverse carpal ligament.  The ligament was divided from proximal to distal until the fat surrounding the palmar arch was encountered. Retractors were then placed in the proximal aspect of the wound to visualize the distal antebrachial fascia.  The fascia was sharply divided under direct visualization.   The wound was then thoroughly irrigated with sterile saline.  The tourniquet was deflated.  Hemostasis was achieved with direct pressure and bipolar electrocautery.  The wound was then closed with 4-0 nylon sutures in a horizontal mattress fashion. The wound was then dressed with xeroform, folded kerlix, and an ace wrap.  The patient was reversed from light sedation.  They were transferred from the operating table to the postoperative bed.  All counts were correct x 2 at the end of the procedure.  The patient was then taken to the PACU in stable condition.   POSTOPERATIVE PLAN: She will be discharged to home with appropriate pain medication and discharge instructions.  I'll see her back in the office in 10-14 days for her first postop visit.   Carlin Galla, MD 2:18 PM  "

## 2024-05-17 NOTE — Anesthesia Postprocedure Evaluation (Signed)
"   Anesthesia Post Note  Patient: Crystal Escobar  Procedure(s) Performed: CARPAL TUNNEL RELEASE (Left: Hand)     Patient location during evaluation: PACU Anesthesia Type: Regional Level of consciousness: awake and alert Pain management: pain level controlled Vital Signs Assessment: post-procedure vital signs reviewed and stable Respiratory status: spontaneous breathing, nonlabored ventilation, respiratory function stable and patient connected to nasal cannula oxygen Cardiovascular status: blood pressure returned to baseline and stable Postop Assessment: no apparent nausea or vomiting Anesthetic complications: no   No notable events documented.  Last Vitals:  Vitals:   05/17/24 1445 05/17/24 1500  BP: (!) 138/91 122/89  Pulse: 70 71  Resp: 16 14  Temp:  36.6 C  SpO2: 97% 99%    Last Pain:  Vitals:   05/17/24 1500  TempSrc:   PainSc: 0-No pain                 Crystal Escobar      "

## 2024-05-17 NOTE — Transfer of Care (Signed)
 Immediate Anesthesia Transfer of Care Note  Patient: Crystal Escobar  Procedure(s) Performed: CARPAL TUNNEL RELEASE (Left: Hand)  Patient Location: PACU  Anesthesia Type:MAC combined with regional for post-op pain  Level of Consciousness: awake, alert , oriented, and patient cooperative  Airway & Oxygen Therapy: Patient Spontanous Breathing  Post-op Assessment: Report given to RN and Post -op Vital signs reviewed and stable  Post vital signs: Reviewed and stable  Last Vitals:  Vitals Value Taken Time  BP 147/90 05/17/24 14:25  Temp    Pulse 69 05/17/24 14:26  Resp 13 05/17/24 14:26  SpO2 99 % 05/17/24 14:26  Vitals shown include unfiled device data.  Last Pain:  Vitals:   05/17/24 1218  TempSrc:   PainSc: 0-No pain         Complications: No notable events documented.

## 2024-05-17 NOTE — Interval H&P Note (Signed)
 History and Physical Interval Note:  05/17/2024 1:15 PM  Crystal Escobar  has presented today for surgery, with the diagnosis of Left carpal tunnel syndrome.  The various methods of treatment have been discussed with the patient and family. After consideration of risks, benefits and other options for treatment, the patient has consented to  Procedures: CARPAL TUNNEL RELEASE (Left) as a surgical intervention.  The patient's history has been reviewed, patient examined, no change in status, stable for surgery.  I have reviewed the patient's chart and labs.  Questions were answered to the patient's satisfaction.     [name]

## 2024-05-18 ENCOUNTER — Encounter (HOSPITAL_COMMUNITY): Payer: Self-pay | Admitting: Orthopedic Surgery

## 2024-05-20 ENCOUNTER — Other Ambulatory Visit: Payer: Self-pay | Admitting: Internal Medicine

## 2024-05-20 DIAGNOSIS — H9203 Otalgia, bilateral: Secondary | ICD-10-CM

## 2024-05-22 NOTE — Assessment & Plan Note (Signed)
-   Advised on proper body positioning and lifting techniques. - Recommended topical ointments such as Biofreeze or diclofenac . - Encouraged continuation of exercise and weight loss efforts.

## 2024-05-22 NOTE — Assessment & Plan Note (Signed)
-   Prescribed non-drowsy muscle relaxant as needed

## 2024-05-22 NOTE — Assessment & Plan Note (Signed)
 She is encouraged to strive for BMI less than 30 to decrease cardiac risk. Advised to aim for at least 150 minutes of exercise per week.

## 2024-05-26 ENCOUNTER — Other Ambulatory Visit: Payer: Self-pay | Admitting: Internal Medicine

## 2024-06-28 ENCOUNTER — Ambulatory Visit: Payer: Self-pay | Admitting: Internal Medicine

## 2025-03-05 ENCOUNTER — Encounter: Payer: Self-pay | Admitting: Internal Medicine

## 2025-03-07 ENCOUNTER — Encounter: Payer: Self-pay | Admitting: Internal Medicine
